# Patient Record
Sex: Female | Born: 1935 | Race: White | Hispanic: No | Marital: Married | Smoking: Current every day smoker
Health system: Southern US, Community
[De-identification: ages and names within clinical notes are randomized; demographics above are authoritative.]

## PROBLEM LIST (undated history)

## (undated) DIAGNOSIS — I739 Peripheral vascular disease, unspecified: Secondary | ICD-10-CM

## (undated) DIAGNOSIS — E119 Type 2 diabetes mellitus without complications: Secondary | ICD-10-CM

## (undated) DIAGNOSIS — I10 Essential (primary) hypertension: Secondary | ICD-10-CM

## (undated) HISTORY — DX: Type 2 diabetes mellitus without complications: E11.9

## (undated) HISTORY — PX: ABDOMINAL HYSTERECTOMY: SHX81

## (undated) HISTORY — DX: Essential (primary) hypertension: I10

---

## 1963-06-30 HISTORY — PX: CHOLECYSTECTOMY: SHX55

## 2005-12-28 ENCOUNTER — Ambulatory Visit: Payer: Self-pay | Admitting: Unknown Physician Specialty

## 2006-03-25 ENCOUNTER — Ambulatory Visit: Payer: Self-pay | Admitting: Unknown Physician Specialty

## 2006-10-21 ENCOUNTER — Ambulatory Visit: Payer: Self-pay | Admitting: Internal Medicine

## 2007-12-21 ENCOUNTER — Ambulatory Visit: Payer: Self-pay | Admitting: Internal Medicine

## 2008-09-24 ENCOUNTER — Ambulatory Visit: Payer: Self-pay | Admitting: General Practice

## 2008-10-08 ENCOUNTER — Inpatient Hospital Stay: Payer: Self-pay | Admitting: General Practice

## 2009-03-07 ENCOUNTER — Ambulatory Visit: Payer: Self-pay | Admitting: Internal Medicine

## 2010-04-26 ENCOUNTER — Emergency Department: Payer: Self-pay | Admitting: Unknown Physician Specialty

## 2010-07-08 ENCOUNTER — Ambulatory Visit: Payer: Self-pay | Admitting: Internal Medicine

## 2012-07-12 ENCOUNTER — Ambulatory Visit: Payer: Self-pay | Admitting: Internal Medicine

## 2012-11-25 ENCOUNTER — Other Ambulatory Visit: Payer: Self-pay | Admitting: Gastroenterology

## 2012-11-25 LAB — CLOSTRIDIUM DIFFICILE BY PCR

## 2012-11-27 LAB — STOOL CULTURE

## 2012-12-20 ENCOUNTER — Inpatient Hospital Stay: Payer: Self-pay | Admitting: Student

## 2012-12-20 LAB — CBC WITH DIFFERENTIAL/PLATELET
Basophil %: 1.2 %
Eosinophil %: 0.5 %
HCT: 38.7 % (ref 35.0–47.0)
HGB: 13.3 g/dL (ref 12.0–16.0)
Lymphocyte #: 3.6 10*3/uL (ref 1.0–3.6)
Lymphocyte %: 18.4 %
MCV: 89 fL (ref 80–100)
Monocyte #: 2.4 x10 3/mm — ABNORMAL HIGH (ref 0.2–0.9)
Monocyte %: 12.1 %
Neutrophil #: 13.3 10*3/uL — ABNORMAL HIGH (ref 1.4–6.5)
Neutrophil %: 67.8 %
Platelet: 281 10*3/uL (ref 150–440)
RBC: 4.37 10*6/uL (ref 3.80–5.20)
RDW: 13.5 % (ref 11.5–14.5)

## 2012-12-20 LAB — TROPONIN I: Troponin-I: <0.02 ng/mL

## 2012-12-20 LAB — COMPREHENSIVE METABOLIC PANEL
Albumin: 3.5 g/dL (ref 3.4–5.0)
Alkaline Phosphatase: 95 U/L (ref 50–136)
Anion Gap: 8 (ref 7–16)
BUN: 13 mg/dL (ref 7–18)
Bilirubin,Total: 0.7 mg/dL (ref 0.2–1.0)
Calcium, Total: 9.9 mg/dL (ref 8.5–10.1)
Chloride: 96 mmol/L — ABNORMAL LOW (ref 98–107)
Co2: 28 mmol/L (ref 21–32)
EGFR (Non-African Amer.): 60 — ABNORMAL LOW
Glucose: 143 mg/dL — ABNORMAL HIGH (ref 65–99)
SGOT(AST): 33 U/L (ref 15–37)
SGPT (ALT): 26 U/L (ref 12–78)
Sodium: 132 mmol/L — ABNORMAL LOW (ref 136–145)

## 2012-12-20 LAB — URINALYSIS, COMPLETE
Ketone: NEGATIVE
Leukocyte Esterase: NEGATIVE
Nitrite: NEGATIVE
RBC,UR: 5 /HPF (ref 0–5)
Squamous Epithelial: NONE SEEN

## 2012-12-21 LAB — CBC WITH DIFFERENTIAL/PLATELET
Basophil #: 0.1 10*3/uL (ref 0.0–0.1)
Basophil %: 0.8 %
Eosinophil #: 0.1 10*3/uL (ref 0.0–0.7)
HCT: 37.4 % (ref 35.0–47.0)
HGB: 13 g/dL (ref 12.0–16.0)
Lymphocyte #: 2.6 10*3/uL (ref 1.0–3.6)
Lymphocyte %: 17.5 %
MCV: 89 fL (ref 80–100)
Monocyte %: 11.1 %
Neutrophil #: 10.4 10*3/uL — ABNORMAL HIGH (ref 1.4–6.5)
Neutrophil %: 69.8 %

## 2012-12-21 LAB — MAGNESIUM: Magnesium: 1.9 mg/dL

## 2012-12-21 LAB — HEMOGLOBIN A1C: Hemoglobin A1C: 8.1 % — ABNORMAL HIGH (ref 4.2–6.3)

## 2012-12-22 LAB — CBC WITH DIFFERENTIAL/PLATELET
Basophil %: 0.5 %
Eosinophil #: 0.3 10*3/uL (ref 0.0–0.7)
Eosinophil %: 2.2 %
HGB: 12.5 g/dL (ref 12.0–16.0)
MCH: 30.1 pg (ref 26.0–34.0)
MCHC: 33.9 g/dL (ref 32.0–36.0)
MCV: 89 fL (ref 80–100)
Monocyte #: 1.4 x10 3/mm — ABNORMAL HIGH (ref 0.2–0.9)
Monocyte %: 10.1 %
Neutrophil #: 9.5 10*3/uL — ABNORMAL HIGH (ref 1.4–6.5)
Platelet: 254 10*3/uL (ref 150–440)
RBC: 4.15 10*6/uL (ref 3.80–5.20)
WBC: 14 10*3/uL — ABNORMAL HIGH (ref 3.6–11.0)

## 2012-12-22 LAB — BASIC METABOLIC PANEL
Anion Gap: 6 — ABNORMAL LOW (ref 7–16)
BUN: 9 mg/dL (ref 7–18)
Calcium, Total: 9.3 mg/dL (ref 8.5–10.1)
Chloride: 101 mmol/L (ref 98–107)
EGFR (African American): >60
Glucose: 138 mg/dL — ABNORMAL HIGH (ref 65–99)

## 2012-12-22 LAB — URINE CULTURE

## 2012-12-23 LAB — CBC WITH DIFFERENTIAL/PLATELET
Basophil #: 0.1 10*3/uL (ref 0.0–0.1)
Basophil %: 0.4 %
HCT: 32.9 % — ABNORMAL LOW (ref 35.0–47.0)
HGB: 11.3 g/dL — ABNORMAL LOW (ref 12.0–16.0)
Monocyte #: 1.9 x10 3/mm — ABNORMAL HIGH (ref 0.2–0.9)
Neutrophil #: 9.4 10*3/uL — ABNORMAL HIGH (ref 1.4–6.5)
RBC: 3.7 10*6/uL — ABNORMAL LOW (ref 3.80–5.20)
RDW: 13.7 % (ref 11.5–14.5)

## 2012-12-26 LAB — CULTURE, BLOOD (SINGLE)

## 2013-01-30 ENCOUNTER — Ambulatory Visit: Payer: Self-pay | Admitting: Gastroenterology

## 2013-04-28 ENCOUNTER — Inpatient Hospital Stay: Payer: Self-pay | Admitting: Internal Medicine

## 2013-04-28 LAB — DIFFERENTIAL
Basophil #: 0.1 10*3/uL (ref 0.0–0.1)
Comment - H1-Com1: NORMAL
Eosinophil %: 0 %
Lymphocyte #: 1.8 10*3/uL (ref 1.0–3.6)
Lymphocyte %: 6 %
Lymphocytes: 9 %
Monocyte %: 7.3 %
Monocytes: 8 %
Neutrophil %: 86.4 %
Segmented Neutrophils: 73 %

## 2013-04-28 LAB — URINALYSIS, COMPLETE
Bilirubin,UR: NEGATIVE
Glucose,UR: NEGATIVE mg/dL (ref 0–75)
Hyaline Cast: 2
Ketone: NEGATIVE
Nitrite: NEGATIVE
Protein: 30
RBC,UR: 1 /HPF (ref 0–5)
Squamous Epithelial: <1

## 2013-04-28 LAB — CBC
HCT: 38.5 % (ref 35.0–47.0)
HGB: 12.8 g/dL (ref 12.0–16.0)
MCH: 29 pg (ref 26.0–34.0)
MCHC: 33.3 g/dL (ref 32.0–36.0)
Platelet: 294 10*3/uL (ref 150–440)

## 2013-04-28 LAB — RAPID INFLUENZA A&B ANTIGENS

## 2013-04-28 LAB — COMPREHENSIVE METABOLIC PANEL
Albumin: 3.3 g/dL — ABNORMAL LOW (ref 3.4–5.0)
Alkaline Phosphatase: 98 U/L (ref 50–136)
Anion Gap: 9 (ref 7–16)
Bilirubin,Total: 0.6 mg/dL (ref 0.2–1.0)
Calcium, Total: 9.5 mg/dL (ref 8.5–10.1)
Chloride: 105 mmol/L (ref 98–107)
Co2: 21 mmol/L (ref 21–32)
EGFR (African American): >60
EGFR (Non-African Amer.): 53 — ABNORMAL LOW
Glucose: 183 mg/dL — ABNORMAL HIGH (ref 65–99)
Osmolality: 278 (ref 275–301)
Potassium: 3.7 mmol/L (ref 3.5–5.1)
SGPT (ALT): 25 U/L (ref 12–78)
Sodium: 135 mmol/L — ABNORMAL LOW (ref 136–145)
Total Protein: 7 g/dL (ref 6.4–8.2)

## 2013-04-28 LAB — SEDIMENTATION RATE: Erythrocyte Sed Rate: 10 mm/hr (ref 0–30)

## 2013-04-29 LAB — CBC WITH DIFFERENTIAL/PLATELET
Eosinophil #: 0 10*3/uL (ref 0.0–0.7)
Lymphocyte #: 1.3 10*3/uL (ref 1.0–3.6)
MCH: 29.5 pg (ref 26.0–34.0)
MCHC: 33.7 g/dL (ref 32.0–36.0)

## 2013-04-29 LAB — COMPREHENSIVE METABOLIC PANEL
Albumin: 2.8 g/dL — ABNORMAL LOW (ref 3.4–5.0)
Alkaline Phosphatase: 80 U/L (ref 50–136)
Anion Gap: 3 — ABNORMAL LOW (ref 7–16)
BUN: 25 mg/dL — ABNORMAL HIGH (ref 7–18)
Calcium, Total: 8.9 mg/dL (ref 8.5–10.1)
Glucose: 170 mg/dL — ABNORMAL HIGH (ref 65–99)
Osmolality: 267 (ref 275–301)
SGOT(AST): 25 U/L (ref 15–37)
SGPT (ALT): 21 U/L (ref 12–78)
Sodium: 129 mmol/L — ABNORMAL LOW (ref 136–145)
Total Protein: 6.7 g/dL (ref 6.4–8.2)

## 2013-04-29 LAB — URINE CULTURE

## 2013-04-29 LAB — HEMOGLOBIN A1C: Hemoglobin A1C: 7.2 % — ABNORMAL HIGH (ref 4.2–6.3)

## 2013-04-30 LAB — CBC WITH DIFFERENTIAL/PLATELET
Basophil #: 0.1 10*3/uL (ref 0.0–0.1)
Basophil %: 0.4 %
Eosinophil #: 0.2 10*3/uL (ref 0.0–0.7)
Eosinophil %: 1.2 %
HCT: 30.7 % — ABNORMAL LOW (ref 35.0–47.0)
HGB: 10.5 g/dL — ABNORMAL LOW (ref 12.0–16.0)
Lymphocyte %: 16.1 %
MCHC: 34.3 g/dL (ref 32.0–36.0)
MCV: 86 fL (ref 80–100)
Monocyte #: 1.1 x10 3/mm — ABNORMAL HIGH (ref 0.2–0.9)
Neutrophil #: 10.5 10*3/uL — ABNORMAL HIGH (ref 1.4–6.5)
Neutrophil %: 74.6 %
Platelet: 242 10*3/uL (ref 150–440)
RDW: 13.9 % (ref 11.5–14.5)
WBC: 14 10*3/uL — ABNORMAL HIGH (ref 3.6–11.0)

## 2013-04-30 LAB — BASIC METABOLIC PANEL
Anion Gap: 5 — ABNORMAL LOW (ref 7–16)
BUN: 15 mg/dL (ref 7–18)
Chloride: 106 mmol/L (ref 98–107)
Creatinine: 0.99 mg/dL (ref 0.60–1.30)
EGFR (African American): >60
EGFR (Non-African Amer.): 53 — ABNORMAL LOW
Osmolality: 275 (ref 275–301)
Sodium: 136 mmol/L (ref 136–145)

## 2013-05-03 ENCOUNTER — Encounter: Payer: Self-pay | Admitting: Internal Medicine

## 2013-05-03 LAB — CULTURE, BLOOD (SINGLE)

## 2013-07-25 ENCOUNTER — Ambulatory Visit: Payer: Self-pay | Admitting: General Practice

## 2013-07-25 LAB — CBC
HCT: 39.1 % (ref 35.0–47.0)
HGB: 12.8 g/dL (ref 12.0–16.0)
MCH: 28.6 pg (ref 26.0–34.0)
MCHC: 32.6 g/dL (ref 32.0–36.0)
MCV: 88 fL (ref 80–100)
Platelet: 340 10*3/uL (ref 150–440)
RBC: 4.47 10*6/uL (ref 3.80–5.20)
RDW: 13.6 % (ref 11.5–14.5)
WBC: 10.5 10*3/uL (ref 3.6–11.0)

## 2013-07-25 LAB — URINALYSIS, COMPLETE
Bilirubin,UR: NEGATIVE
Blood: NEGATIVE
GLUCOSE, UR: NEGATIVE mg/dL (ref 0–75)
Hyaline Cast: 4
Ketone: NEGATIVE
Nitrite: NEGATIVE
Ph: 5 (ref 4.5–8.0)
Protein: 30
RBC,UR: 10 /HPF (ref 0–5)
Specific Gravity: 1.014 (ref 1.003–1.030)
Transitional Epi: 3

## 2013-07-25 LAB — BASIC METABOLIC PANEL
Anion Gap: 8 (ref 7–16)
BUN: 16 mg/dL (ref 7–18)
CALCIUM: 9.9 mg/dL (ref 8.5–10.1)
CHLORIDE: 98 mmol/L (ref 98–107)
CREATININE: 0.88 mg/dL (ref 0.60–1.30)
Co2: 27 mmol/L (ref 21–32)
EGFR (Non-African Amer.): >60
Glucose: 158 mg/dL — ABNORMAL HIGH (ref 65–99)
OSMOLALITY: 271 (ref 275–301)
Potassium: 3.9 mmol/L (ref 3.5–5.1)
Sodium: 133 mmol/L — ABNORMAL LOW (ref 136–145)

## 2013-07-25 LAB — PROTIME-INR
INR: 1
Prothrombin Time: 12.8 secs (ref 11.5–14.7)

## 2013-07-25 LAB — APTT: ACTIVATED PTT: 34.3 s (ref 23.6–35.9)

## 2013-07-25 LAB — SEDIMENTATION RATE: Erythrocyte Sed Rate: 18 mm/hr (ref 0–30)

## 2013-07-26 LAB — MRSA PCR SCREENING

## 2013-07-27 LAB — URINE CULTURE

## 2013-08-07 ENCOUNTER — Inpatient Hospital Stay: Payer: Self-pay | Admitting: General Practice

## 2013-08-08 LAB — BASIC METABOLIC PANEL
ANION GAP: 6 — AB (ref 7–16)
BUN: 8 mg/dL (ref 7–18)
CO2: 25 mmol/L (ref 21–32)
Calcium, Total: 8.9 mg/dL (ref 8.5–10.1)
Chloride: 103 mmol/L (ref 98–107)
Creatinine: 0.85 mg/dL (ref 0.60–1.30)
GLUCOSE: 201 mg/dL — AB (ref 65–99)
Osmolality: 272 (ref 275–301)
POTASSIUM: 3.8 mmol/L (ref 3.5–5.1)
Sodium: 134 mmol/L — ABNORMAL LOW (ref 136–145)

## 2013-08-08 LAB — HEMOGLOBIN: HGB: 10.3 g/dL — AB (ref 12.0–16.0)

## 2013-08-08 LAB — PLATELET COUNT: PLATELETS: 259 10*3/uL (ref 150–440)

## 2013-08-09 LAB — BASIC METABOLIC PANEL
ANION GAP: 6 — AB (ref 7–16)
BUN: 8 mg/dL (ref 7–18)
CALCIUM: 8.9 mg/dL (ref 8.5–10.1)
CHLORIDE: 101 mmol/L (ref 98–107)
Co2: 26 mmol/L (ref 21–32)
Creatinine: 0.72 mg/dL (ref 0.60–1.30)
EGFR (African American): >60
EGFR (Non-African Amer.): >60
GLUCOSE: 119 mg/dL — AB (ref 65–99)
Osmolality: 266 (ref 275–301)
POTASSIUM: 3.7 mmol/L (ref 3.5–5.1)
Sodium: 133 mmol/L — ABNORMAL LOW (ref 136–145)

## 2013-08-09 LAB — HEMOGLOBIN: HGB: 9.6 g/dL — ABNORMAL LOW (ref 12.0–16.0)

## 2013-08-09 LAB — PLATELET COUNT: Platelet: 243 10*3/uL (ref 150–440)

## 2013-08-10 LAB — PATHOLOGY REPORT

## 2013-08-12 ENCOUNTER — Encounter: Payer: Self-pay | Admitting: Internal Medicine

## 2013-08-20 ENCOUNTER — Emergency Department: Payer: Self-pay | Admitting: Emergency Medicine

## 2013-08-24 LAB — CBC WITH DIFFERENTIAL/PLATELET
Basophil #: 0.2 10*3/uL — ABNORMAL HIGH (ref 0.0–0.1)
Basophil %: 1.4 %
EOS PCT: 1.5 %
Eosinophil #: 0.2 10*3/uL (ref 0.0–0.7)
HCT: 33.7 % — ABNORMAL LOW (ref 35.0–47.0)
HGB: 11 g/dL — ABNORMAL LOW (ref 12.0–16.0)
LYMPHS ABS: 3.4 10*3/uL (ref 1.0–3.6)
LYMPHS PCT: 30.3 %
MCH: 28.2 pg (ref 26.0–34.0)
MCHC: 32.5 g/dL (ref 32.0–36.0)
MCV: 87 fL (ref 80–100)
MONO ABS: 0.8 x10 3/mm (ref 0.2–0.9)
Monocyte %: 7.6 %
Neutrophil #: 6.6 10*3/uL — ABNORMAL HIGH (ref 1.4–6.5)
Neutrophil %: 59.2 %
Platelet: 686 10*3/uL — ABNORMAL HIGH (ref 150–440)
RBC: 3.89 10*6/uL (ref 3.80–5.20)
RDW: 13.7 % (ref 11.5–14.5)
WBC: 11.2 10*3/uL — ABNORMAL HIGH (ref 3.6–11.0)

## 2013-08-24 LAB — BASIC METABOLIC PANEL
Anion Gap: 6 — ABNORMAL LOW (ref 7–16)
BUN: 16 mg/dL (ref 7–18)
CO2: 29 mmol/L (ref 21–32)
CREATININE: 0.91 mg/dL (ref 0.60–1.30)
Calcium, Total: 10 mg/dL (ref 8.5–10.1)
Chloride: 98 mmol/L (ref 98–107)
EGFR (African American): >60
EGFR (Non-African Amer.): 59 — ABNORMAL LOW
GLUCOSE: 125 mg/dL — AB (ref 65–99)
Osmolality: 269 (ref 275–301)
POTASSIUM: 3.8 mmol/L (ref 3.5–5.1)
Sodium: 133 mmol/L — ABNORMAL LOW (ref 136–145)

## 2013-08-27 ENCOUNTER — Encounter: Payer: Self-pay | Admitting: Internal Medicine

## 2013-08-31 LAB — HEMOGLOBIN A1C: Hemoglobin A1C: 7.1 % — ABNORMAL HIGH (ref 4.2–6.3)

## 2013-12-14 DIAGNOSIS — E1169 Type 2 diabetes mellitus with other specified complication: Secondary | ICD-10-CM | POA: Insufficient documentation

## 2013-12-14 DIAGNOSIS — N183 Chronic kidney disease, stage 3 unspecified: Secondary | ICD-10-CM | POA: Insufficient documentation

## 2013-12-14 DIAGNOSIS — K219 Gastro-esophageal reflux disease without esophagitis: Secondary | ICD-10-CM | POA: Insufficient documentation

## 2013-12-14 DIAGNOSIS — E785 Hyperlipidemia, unspecified: Secondary | ICD-10-CM

## 2013-12-14 DIAGNOSIS — I10 Essential (primary) hypertension: Secondary | ICD-10-CM

## 2013-12-14 DIAGNOSIS — Z794 Long term (current) use of insulin: Secondary | ICD-10-CM

## 2013-12-14 DIAGNOSIS — E1122 Type 2 diabetes mellitus with diabetic chronic kidney disease: Secondary | ICD-10-CM | POA: Insufficient documentation

## 2013-12-14 DIAGNOSIS — I350 Nonrheumatic aortic (valve) stenosis: Secondary | ICD-10-CM | POA: Insufficient documentation

## 2013-12-14 DIAGNOSIS — E1159 Type 2 diabetes mellitus with other circulatory complications: Secondary | ICD-10-CM | POA: Insufficient documentation

## 2014-01-17 DIAGNOSIS — R609 Edema, unspecified: Secondary | ICD-10-CM | POA: Insufficient documentation

## 2014-06-18 DIAGNOSIS — Z79899 Other long term (current) drug therapy: Secondary | ICD-10-CM | POA: Insufficient documentation

## 2014-06-29 HISTORY — PX: JOINT REPLACEMENT: SHX530

## 2014-08-09 IMAGING — CR RIGHT HIP - COMPLETE 2+ VIEW
1 series · 4 of 4 positions shown · non-contrast
Comparison: none

Addendum Begins
REASON FOR EXAM: severe right hip pain
COMMENTS:

PROCEDURE:     DXR - DXR HIP RIGHT COMPLETE  - April 28, 2013 [DATE]
RESULT:
A total right hip replacement is identified. The hardware appears intact
without evidence of loosening or failure. The osseous structures demonstrate
no evidence of fracture or dislocation.

[Series 1: ap · 0.17mm/px · 4 of 4 slices shown]
[im 1/4]
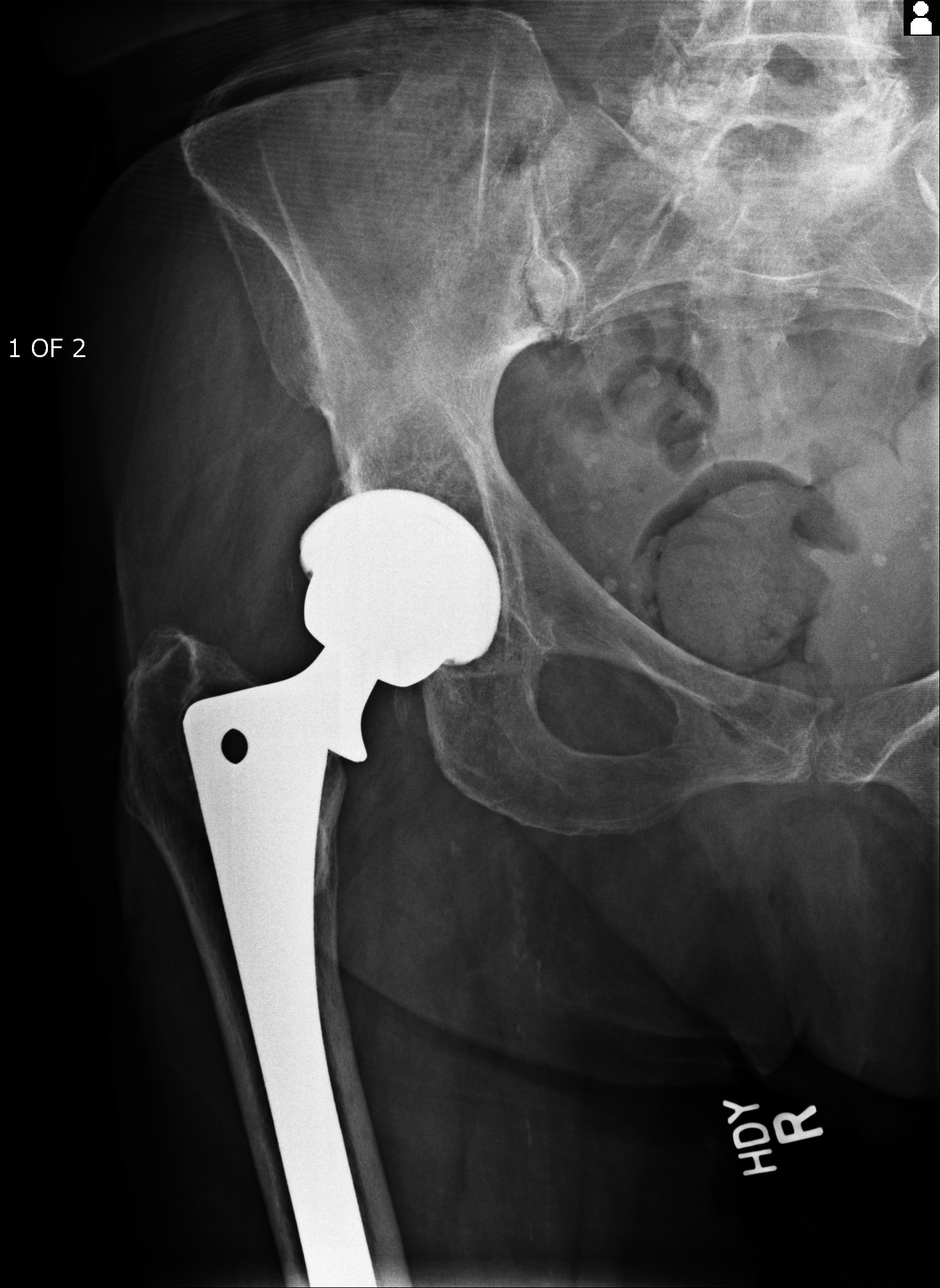
[im 2/4]
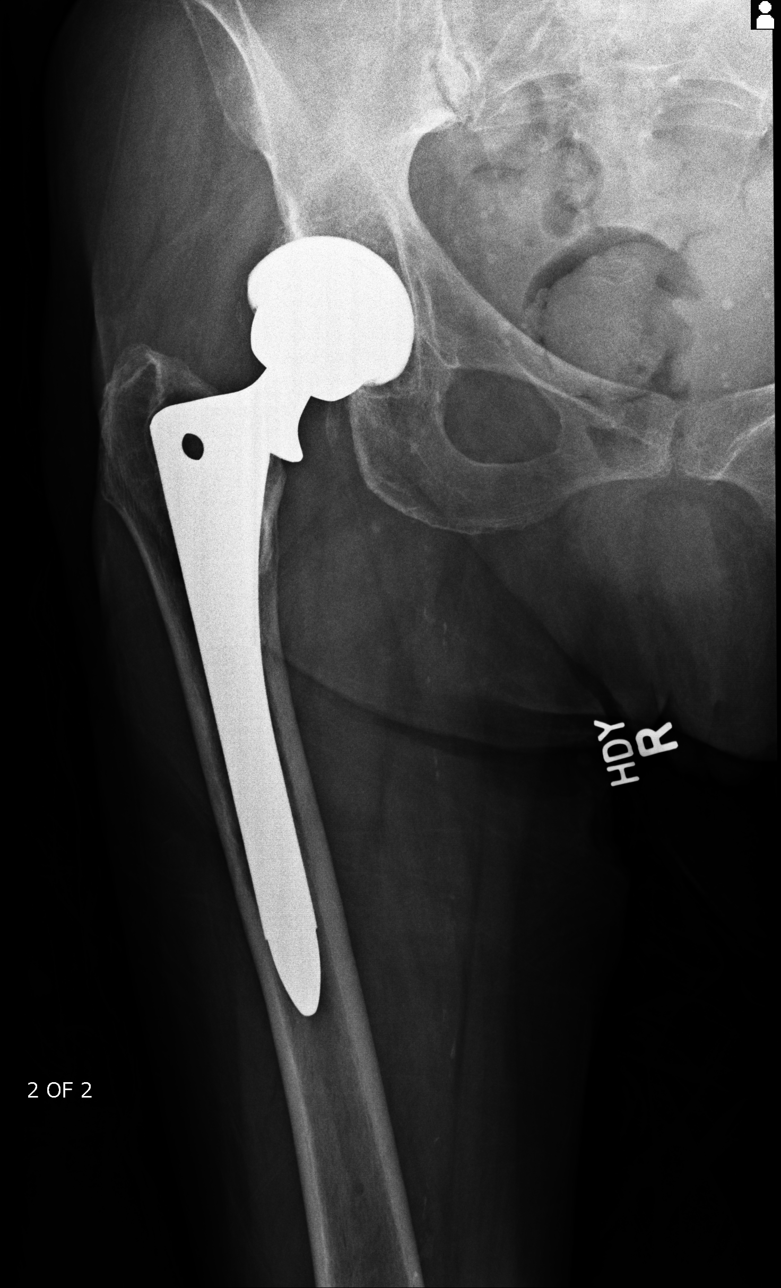
[im 3/4]
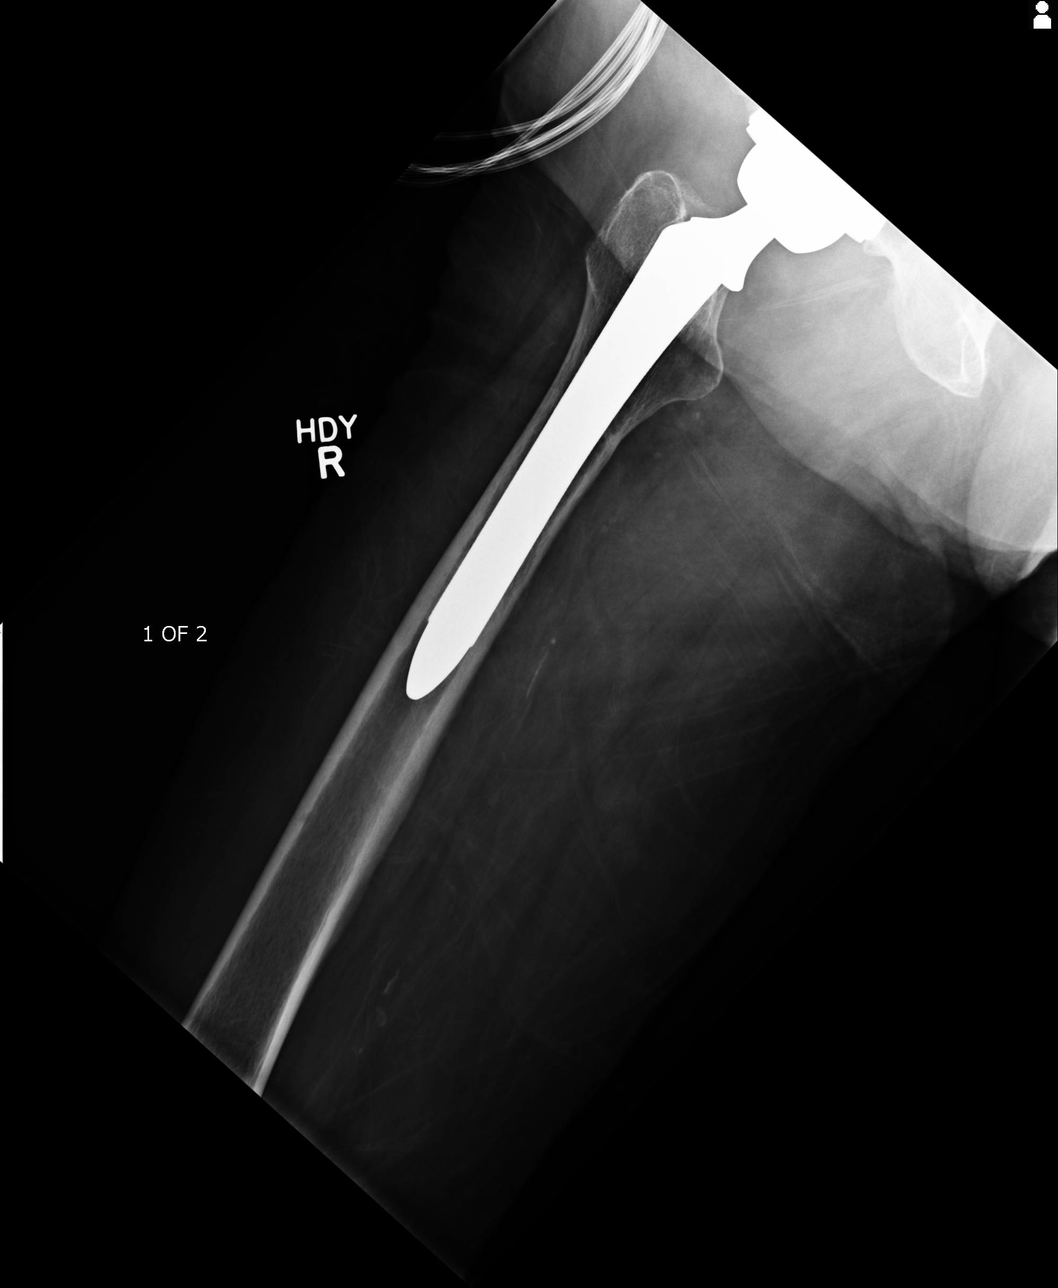
[im 4/4]
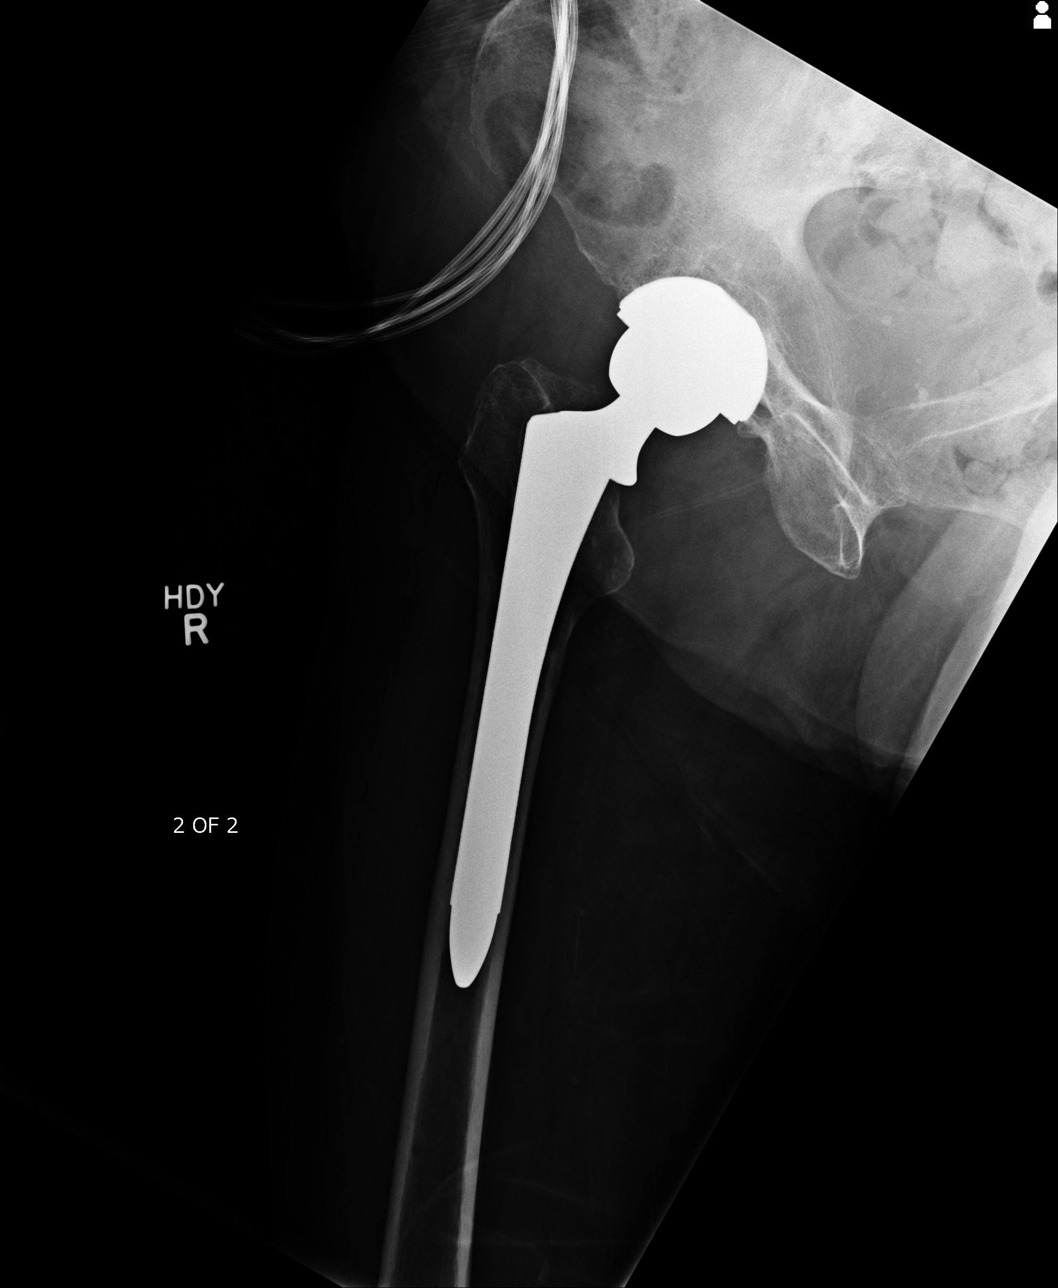

[4 of 4 positions shown; findings below may reference images not displayed]

IMPRESSION: Total right hip replacement without evidence of acute osseous
abnormalities.

Addendum Ends

## 2014-09-27 ENCOUNTER — Ambulatory Visit
Admit: 2014-09-27 | Disposition: A | Discharge status: Home / Self Care | Payer: Self-pay | Attending: Ophthalmology | Admitting: Ophthalmology

## 2014-09-27 LAB — POTASSIUM: Potassium: 4 mmol/L

## 2014-10-04 ENCOUNTER — Ambulatory Visit
Admit: 2014-10-04 | Disposition: A | Discharge status: Home / Self Care | Payer: Self-pay | Attending: Ophthalmology | Admitting: Ophthalmology

## 2014-10-19 NOTE — Consult Note (Signed)
Chief Complaint and History:  Referring Physician Dr. Jacques NavyAhmadzia   Chief Complaint Hyperthyroidism   Allergies:  No Known Allergies:   Assessment/Plan:  Assessment/Plan Pt seen in consultation. 81 yo F with no h/o thyroid disease admitted for sepsis and pneumonia. Found to have incidental mildly low TSH and elevated free T4. Not exposued to iodinated contrast, amiodarone, or lithium. She was started her on glucocorticoids. She does not have a goiter. She is not tachycardic and not on a beta blocker.  A/ Abnormal thyroid function tests c/w hyperthyroidism - causes may include silent thyroiditis in the setting of acute illness versus Grave's Hyperthyroidism versus a toxic adenoma or multinodular goiter. Given her underlying illness and lack of signs/symptoms c/w hyperthyroidism as well as mild nature of abnormal TFTs, I favor silent thyroiditis.  P/ -Will obtain a total T3 and TrAb which should likely be elevated if this is Grave's -Will reepat free T4 in 2-3 days. -If acute thyroiditis, this may self resolved as clinical condition improves in 4-6 weeks. I recomend not treating with thionamide at this time. Also no beta blocker needed as she is not tachycardic.   Will dictate full consult and see pt back as more labs are resulted and/or if clinical condition changes.   Case Discussed With patient   Electronic Signatures: Raj JanusSolum, Mckenzee Beem M (MD)  (Signed 26-Jun-14 17:13)  Authored: Chief Complaint and History, ALLERGIES, Assessment/Plan   Last Updated: 26-Jun-14 17:13 by Raj JanusSolum, Emerick Weatherly M (MD)

## 2014-10-19 NOTE — H&P (Signed)
PATIENT NAME:  Hannah Conway, Musette J MR#:  [id number] DATE OF BIRTH:  1930/04/25  ADDENDUM  Smoking cessation counseling given to the patient for over 5 minutes. The patient not quite ready to quit smoking.   Offer Nicotine patches. The patient does not want it right now.    ____________________________ Felipa Furnaceoberto Sanchez Gutierrez, MD rsg:np D: 04/28/2013 17:10:00 ET T: 04/28/2013 18:04:53 ET JOB#: [id number]  cc: Felipa Furnaceoberto Sanchez Gutierrez, MD, <Dictator> Kyel Purk Juanda ChanceSANCHEZ GUTIERRE MD ELECTRONICALLY SIGNED 05/17/2013 22:57

## 2014-10-19 NOTE — Discharge Summary (Signed)
PATIENT NAME:  Hannah Conway, Hannah Conway MR#:  [id number] DATE OF BIRTH:  12/01/1929  DATE OF ADMISSION:  04/28/2013 DATE OF DISCHARGE:  05/02/2013  DISCHARGE DIAGNOSES:  1.  Systemic inflammatory response syndrome due to pneumonia. 2.  Altered mental status on admission due to pneumonia. 3.  Metabolic encephalopathy.  4.  Hyponatremia.  5.  Hypertension.  6.  Dehydration.  7.  Diabetes.  8.  Osteoarthritis.   CONDITION ON DISCHARGE: Stable.   CODE STATUS: FULL CODE.   MEDICATIONS ON DISCHARGE: 1.  Aspirin 81 mg oral tablet once a day.  2.  Pravastatin 20 mg oral tablet once a day.  3.  Glipizide XL 10 mg oral tablet once a day.  4.  Benazepril/hydrochlorothiazide 20 mg/12.5 mg oral tablet once a day.  5.  Acetaminophen 325 mg 2 tablets every 4 hours as needed for pain and temperature.  6.  Tamsulosin 0.4 mg oral capsule once a day.  7.  Metformin extended release 500 mg 2 times a day.  8.  Metoprolol 25 mg 2 times a day.  9.  Pantoprazole 40 mg delayed-release tablet once a day.  10.  Azithromycin 500 mg oral tablet once a day for 3 days.   DIET ON DISCHARGE: Low sodium, carbohydrate-controlled ADA diet.  Diet consistency regular.   ACTIVITY: As tolerated.   TIMEFRAME TO FOLLOW-UP: Within 1 to 2 weeks. Advised to have regular followup with PMD and need to check blood pressure medication to be adjusted later on.  HISTORY OF PRESENT ILLNESS: As per Dr. Mordecai MaesSanchez on 31st October 2014, this is an  81 year old female with history of hypertension, diabetes, TIA, hyperlipidemia, COPD and chronic diarrhea who came with history of not being her normal self since 1 day before presentation. She had pain all over her body and she could not walk in the house so her husband called her daughter and they decided to bring her to hospital. She was a smoker and had recent admission with COPD in the past. Her white cell count was found 29,000 on admission and she had a fever of 103 degrees Fahrenheit with  tachycardia of up to 100. Chest x-ray did not show any significant consolidation, urinalysis was negative, so she was admitted with SIRS of unknown etiology and started on broad-spectrum antibiotic. During the hospital course, on CT of the chest and abdomen, she was found having some consolidation in lower lobe of lungs and there were some infiltrates so antibiotic, broad-spectrum, continued and the patient had significant improvement in overall mental status and her white cell count steadily came down in next 2 days. The patient was back to her baseline mental status, was feeling slightly weak, which was generalized weakness, so we tapered down her antibiotics after 48 hours of broad-spectrum antibiotic, and she remained stable for the next 2 days. Physical therapy evaluation was done and they suggested for her to go to a rehab place due to her weakness and we arranged for that on discharge.   Other medical issues in the hospital: 1.  Lactic acidosis which was present on admission, most likely due to infection which improved later on.  2.  Dehydration and hyponatremia. They were present on admission, but with IV fluids they improved.  3.  Diabetes. The patient was continued on glipizide and insulin sliding scale coverage. Remained stable.  4.  Hypertension. Initially blood pressure was running low normal so she was off blood pressure medication, but then gradually with infection coming under control her blood  pressure continued improving and we added her home medication, benazepril/hydrochlorothiazide. Still blood pressure was running high and we had to add metoprolol for better control.   IMPORTANT LABORATORY AND DIAGNOSTICS: In the hospital, CT of the head without contrast showed changes of atrophy and chronic white microvascular ischemic disease. No acute abnormality. White cell count was 29,500, hemoglobin was 12.8 and platelet count 294 on admission. BUN was 23, creatinine was 1 and sodium was 135 on  admission. Chest portable single view on admission: No evidence of acute cardiopulmonary disease. Blood culture was negative. Urinalysis was negative on admission. Influenza A and B were  negative. Lactic acid was 1.6 on admission. CT of chest, abdomen and pelvis: Right lower lobe pneumonia, moderate bladder distention. Foley catheter may prove useful. Finding most consistent with multinodular goiter as described above. White cell count came down to 25 the next day and sodium level dropped to 129 the next day with IV fluid supplementation. Now her sodium improved to 136 and her white cell count came down to 14,000.   TOTAL TIME SPENT ON THIS DISCHARGE: 40 minutes. ____________________________ Hope Pigeon Elisabeth Pigeon, MD vgv:sb D: 05/02/2013 11:05:26 ET T: 05/02/2013 11:50:21 ET JOB#: [id number]  cc: Hope Pigeon. Elisabeth Pigeon, MD, <Dictator> Altamese Dilling MD ELECTRONICALLY SIGNED 05/15/2013 8:12

## 2014-10-19 NOTE — H&P (Signed)
PATIENT NAME:  Hannah Conway, Hannah Conway MR#:  [id number] DATE OF BIRTH:  1929-12-27  DATE OF ADMISSION:  12/20/2012  PRIMARY CARE PHYSICIAN:  Dr. Mickey Farberavid Thies   REFERRING PHYSICIAN:  Dr. Darnelle CatalanMalinda  CHIEF COMPLAINT:  Cough, sputum, fever for 2 days.   HISTORY OF PRESENT ILLNESS: An 81 year old Caucasian female with a history of hypertension, diabetes, TIA. Presented to the ED with cough, sputum, fever for the past 2 days. In addition, patient has shortness of breath, weakness, chills, poor oral intake, unable to walk. The patient is alert, awake, oriented. Denies any abdominal pain, nausea, vomiting or diarrhea. No chest pain, palpitations, orthopnea or nocturnal dyspnea.   PAST MEDICAL HISTORY:  Hypertension, diabetes, TIA.  FAMILY HISTORY:  Hypertension, diabetes.   ALLERGIES:  None.   HOME MEDICATIONS:  Tramadol 50 mg p.o. q. 4 to 6 hours p.r.n., pravastatin 20 mg p.o. at bedtime, Onglyza 5 mg p.o. daily, metformin extended release 500 mg p.o. 2 tablets b.i.d., glipizide XL 10 mg p.o. once a day, benazepril/HCTZ 20 mg/12.5 mg p.o. daily, aspirin 81 mg p.o. daily.   REVIEW OF SYSTEMS:   CONSTITUTIONAL: The patient has fever, chills, headaches, dizziness and weakness.  EYES:  No double vision or blurry vision.  ENT:  No postnasal drip, slurred speech or dysphagia. No epistaxis.  CARDIOVASCULAR: No chest pain, palpitation, orthopnea or nocturnal dyspnea. No leg edema.  PULMONARY:  Positive for cough, sputum, shortness of breath. No wheezing or hemoptysis.  GASTROINTESTINAL:  No abdominal pain, but has nausea. No vomiting, diarrhea. No melena or bloody stool.  GENITOURINARY:  No dysuria, hematuria or incontinence.  SKIN:  No rash or jaundice.  NEUROLOGY:  No syncope, loss of consciousness or seizure.  HEMATOLOGIC: No easy bruising or bleeding.  ENDOCRINE: No polyuria, polydipsia, heat or cold intolerance. Blood sugar is about 200 at home.   VITAL SIGNS: Temperature 102, blood pressure 148/76, pulse  85, respirations 20, O2 saturation 91%.   PHYSICAL EXAMINATION:  GENERAL: The patient is alert, awake, oriented, in no acute distress.  HEENT:  Pupils are round, equal, react to light and accommodation. Moist oral mucosa. Clear pharynx.  NECK: Supple. No JVD or carotid bruit. No lymphadenopathy. No proceeding. No thyromegaly.  CARDIOVASCULAR:  S1 and S2 are regular rate and rhythm. Has a 3/6 systolic murmur. No gallop. PULMONARY:  Bilateral air entry. No wheezing, rales or crackles, but has been very weak recently.  ABDOMEN:  Soft. No distention or tenderness. No organomegaly. Bowel sounds present.  EXTREMITIES: No edema, clubbing or cyanosis. No calf tenderness. Strong bilateral pedal pulses.  SKIN:  No rash or jaundice.  NEUROLOGIC: Alert and oriented xc3. No focal deficit. Power 5/5. Sensation intact.   LABORATORY DATA:  Urinalysis is negative. Chest x-ray shows some interstitial markings, possible pneumonia. Glucose 143, BUN 13, creatinine 0.91, sodium 132, potassium 4.1, chloride 96, bicarb 28. Troponin less than 0.02. WBC 19.6, hemoglobin 13.3, platelets 281. Magnesium 1.1.   SOCIAL HISTORY:  The patient has been smoking for many years. No alcohol drinking or illicit drugs.   IMPRESSIONS: 1.  Sepsis.  2.  Pneumonia.  3.  Hypomagnesemia.   4.  Hypertension.  5.  Diabetes.  6.  Tobacco abuse.   PLAN OF TREATMENT: 1.  The patient will be admitted to a medical floor. The patient was treated with Zithromax and Rocephin in the ED. We will start Levaquin and follow up blood culture, CBC, sputum culture.   2.  We will continue hypertension medication, enalapril/hydrochlorothiazide.  3.  For diabetes, we will hold p.o. medications, start sliding scale, check hemoglobin A1c.    4.  For hypomagnesemia, we will give magnesium supplement and follow up level.   5.  For tobacco abuse, Smoking Cessation was consulted, and patient will be given a nicotine patch.  Discussed the patient's  condition and plan of treatment with the patient and the patient's daughter.   TIME SPENT: About 57 minutes.   The patient is a FULL CODE.    ____________________________ Shaune Pollack, MD qc:mr D: 12/20/2012 17:54:12 ET T: 12/20/2012 19:28:13 ET JOB#: [id number]  cc: Shaune Pollack, MD, <Dictator> Shaune Pollack MD ELECTRONICALLY SIGNED 12/22/2012 15:20

## 2014-10-19 NOTE — Discharge Summary (Signed)
PATIENT NAME:  Hannah Conway, Hannah Conway MR#:  [id number] DATE OF BIRTH:  1929-11-19  DATE OF ADMISSION:  12/20/2012 DATE OF DISCHARGE:  12/23/2012  CONSULTANTS: Dr. Tedd SiasSolum from endocrinology.   PCP: Dr. Audelia Actonheis.   CHIEF COMPLAINT: Cough, sputum, fever.   DISCHARGE DIAGNOSES 1.  Severe sepsis.  2.  Acute respiratory failure.  3.  Probable pneumonia.  4.  Possible underlying undiagnosed chronic obstructive pulmonary disease.  5.  Low TSH and high T3, possibly hypothyroidism versus thyroiditis.  6.  Hypertension.  7.  Diabetes.  8.  Hypomagnesemia.  9.  History of transient ischemic attack.   DISCHARGE MEDICATIONS 1.  Azithromycin 500 mg daily for 4 days.  2.  Vantin 200 mg twice daily for 4 days.  3.  Omeprazole 20 mg daily.  4.  Metformin 500 mg extended-release 2 tabs 2 times a day.  5.  Tramadol 50 mg every 4 to 6 hours as needed.  6.  Onglyza 5 mg once a day.  7.  Aspirin 81 mg daily.  8.  Pravastatin 20 mg daily.  9.  Glipizide XL 10 mg once a day.  10.  Benazepril/hydrochlorothiazide 20/12.5 mg 1 tab once a day.    She will be getting discharged with home health, PT and RN.   DIET: Low sodium, ADA diet.   ACTIVITY: As tolerated.   FOLLOWUP: Please follow with PCP and repeat x-ray of the chest in 4 to 6 weeks and get scheduled for pulmonary function tests an outpatient. Please follow with Dr. Tedd SiasSolum for endocrine and thyroid labs which are still pending on January 06, 2013, at 1:15 p.m.   CODE STATUS:  The patient is full code.   SIGNIFICANT LABORATORY AND RADIOLOGICAL DATA: Initial WBC 19.6, hemoglobin 13.3, platelets 281. A TSH on arrival was 0.264 and free thyroxine was elevated at 1.59. Troponin was negative. Blood cultures no growth to date x 2. Urine cultures no growth to date. Last WBC of 14.2 today, hemoglobin 11.3, platelets 266. LFTs on arrival within normal limits. Hemoglobin A1c of 8.1. Magnesium on arrival 1.1, sodium 132, potassium 4.1 on arrival. Initial x-ray of the chest  no acute cardiopulmonary disease, osteopenia. The following day, chest PA and lateral x-ray showed minimum right lung base presumed atelectasis, possible early infiltrate in the right upper lobe.   HISTORY OF PRESENT ILLNESS AND HOSPITAL COURSE: For full details of H and P, please see the dictation on June 24 by Dr. Imogene Burnhen, but briefly this is a pleasant 81 year old female who came in with above chief complaint including shortness of breath, weakness, chills, poor p.o. intake. She was significantly hypoxic on arrival as well as having respiratory failure and leukocytosis with fevers. The patient was admitted to the hospitalist service, started on IV antibiotics for presumed lung issue as the initial x-ray did not show evidence for pneumonia. The patient did spike fevers and with respiratory symptoms hypoxic respiratory failure x-ray of the chest was repeated, PA and lateral, which showed the above findings, likely underlying pneumonia. Currently, she is on ceftriaxone and azithromycin. The fever has resolved, hypoxia has resolved. The patient likely has severe sepsis secondary to the pneumonia which is currently resolved. She was also started on some gentle IV fluids. Tobacco abuse was strongly recommended against and she was started on nicotine patch. Her blood pressure remained stable. As she was a long-time smoker, outpatient pulmonary function tests have been recommended and she will be getting discharged with p.o. antibiotics.   She did have hyponatremia  and hypomagnesemia which were corrected. She did have possible hypothyroid state versus thyroiditis based on the labs and Dr. Tedd Sias from endocrinology was consulted. We still have some labs including thyrotropin receptor antibodies and T3 pending and we have an appointment for the patient. She was seen by PT and the recommendation is home health, and this has been arranged. At this point, with severe sepsis resolved, she will be discharged with outpatient  followup.   ____________________________ Krystal Eaton, MD sa:cs D: 12/23/2012 15:48:00 ET T: 12/23/2012 22:39:33 ET JOB#: [id number]  cc: Dr. Audelia Acton at [facility] A. Wendall Mola, MD Marcelle Smiling Jacques Navy, MD, <Dictator>  Krystal Eaton MD ELECTRONICALLY SIGNED 01/06/2013 14:59

## 2014-10-19 NOTE — Consult Note (Signed)
PATIENT NAME:  Hannah Conway, Hannah Conway MR#:  [id number] DATE OF BIRTH:  [birth year]  DATE OF CONSULTATION:  12/22/2012  REFERRING PHYSICIAN:  Vivien Presto, MD CONSULTING PHYSICIAN:  A. Lavone Orn, MD  PRIMARY CARE PROVIDER:  Christena Flake. Thies, MD  CHIEF COMPLAINT:  Abnormal thyroid function tests.   HISTORY OF PRESENT ILLNESS:  This is an 81 year old female smoker who was admitted on December 20, 2012 with cough and shortness of breath and found to have pneumonia. She was febrile, mildly hypoxic and hemodynamically stable. She was admitted to the ward and initiated on antibiotics as well as oral steroids. On June 25th, she was noted to have a low TSH of 0.264 uIU/mL (reference range 0.34-5.6) and an elevated free T4 of 1.59 ng/dL (reference range 0.76-1.46). She has no known history of thyroid disease. She denies any neck pain. She has not had any exposure to iodinated contrast dye. Prior to admission, she had not been on glucocorticoids, amiodarone, or lithium. Additionally, she denies heart racing or palpitations, heat intolerance, or recent weight loss.   PAST MEDICAL HISTORY:   1.  Hypertension.  2.  Diabetes.  3.  Hyperlipidemia.  4.  GERD.  5.  TIA.  6.  Tobacco dependence.   ALLERGIES:  No known drug allergies.   INPATIENT MEDICATIONS:    1.  Azithromycin 250 mg IV q. 24 hours.  2.  Benazepril/HCT 20/12.5 mg once daily.  3.  Rocephin 1 gram daily.  4.  Nicotine 14 mg patch once daily.  5.  Pantoprazole 40 mg daily.  6.  Pravastatin 20 mg daily.  7.  Aspirin 81 mg daily.  8.  Lovenox 40 mg daily.  9.  NovoLog sliding scale before every meal and at bedtime.  10.  Albuterol inhaler as needed.  11.  Normal saline 0.9% at 75 mL/hour  SOCIAL HISTORY:  The patient is married. She smokes a pack of cigarettes per day.   FAMILY HISTORY:  Her mother had a history of hyperthyroidism and had undergone radioactive iodine, now deceased. Sister also with a history of hyperthyroidism status post  radioactive iodine use and now with post-ablative hypothyroidism treated with levothyroxine.   REVIEW OF SYSTEMS:  HEENT: Denies blurred vision. Denies sore throat.  NECK: Denies neck pain, denies dysphagia.  CARDIAC: Denies chest pain. Denies palpitation.  PULMONARY: Has had shortness of breath, this seems to be improving. Has a cough.  ABDOMEN: Denies abdominal pain. She was having diarrhea for many weeks prior to hospitalization. She apparently had a GI evaluation for this complaint.  EXTREMITIES: Denies leg swelling. Denies focal weakness.  SKIN: Denies skin rash or recent skin changes. Denies pruritus.  GENERAL: Denies fevers. Denies recent weight loss.  NEUROLOGIC: Denies tremor. Denies recent falls.  ENDOCRINE:  Denies heat or cold intolerance.   PHYSICAL EXAMINATION: VITAL SIGNS: Temperature 99.9, pulse 92, respirations 20, blood pressure 169/77, pulse oximetry 91% on room air.  GENERAL: Well-developed, well-nourished white female in no acute distress.  HEENT: Extraocular movements are intact. Oropharynx is clear. No lid lag or proptosis is present.  NECK: Supple. No thyromegaly is present. No neck tenderness is present. No palpable thyroid nodules.  LYMPHATICS: No submandibular or anterior cervical lymphadenopathy.  CARDIAC: Regular rate and rhythm. A systolic murmur is heard. No carotid bruit.  PULMONARY: Had decreased breath sounds throughout both lung fields. No rhonchi.  ABDOMEN: Diffusely soft, nontender.  NEUROLOGIC: No tremor of outstretched hands. EOMI.  EXTREMITIES: No edema is present.  SKIN: No rash  or dermatopathy is present.  PSYCHIATRIC: Alert and oriented x 3.   LABORATORY DATA:  On December 21, 2012, TSH was 0.264 and free T4 was 1.59. On December 22, 2012, glucose is 138, BUN 9, creatinine 0.84, sodium 137, potassium 3.5, chloride 101, EGFR greater than 60. WBC is 14, hematocrit 36.8, platelets 254.   ASSESSMENT:  An 81 year old female hospitalized with pneumonia,  history of tobacco dependence and probable chronic obstructive pulmonary disease exacerbation found to have incidental abnormal thyroid function tests. She does not have a goiter on exam. She does not have clear signs or symptoms related to hyperthyroidism. She does have a strong family history of hyperthyroidism. I reviewed records at [facility] as well and her last TSH was checked there in December 2010 at which time it was normal; however, she did have a mildly low TSH of 0.335 in June 2010. Currently, her low TSH and elevated free T4 are consistent with hyperthyroidism. Given her clinical situation, this is most likely due to silent thyroiditis which is a self-limited condition and occurs in the setting of acute illness. Other possible causes include Graves' disease or a toxic nodule or multinodular goiter.   RECOMMENDATIONS:   1.  Will check a thyroid antibody as well as a total T3 level, as these tend to be elevated in Graves' disease.  2.  Recommend repeating a free T4 in 2 to 3 days. It would be helpful to see the trend and perhaps if it increases, we could consider therapy.  3.  At this time, my preference is to monitor clinically only and should she develop tachycardia, I could consider use of a beta-blocker and/or initiate ethionamide for treatment of hyperthyroidism; however, I do suspect that this will resolve as her clinical condition improves.  4.  I would avoid iodinated contrast dye if possible given the potential risk of underlying Graves' disease.   Thank you for the kind request for consultation. I will follow along as her labs are resulted.    ____________________________ A. Lavone Orn, MD ams:si D: 12/22/2012 20:54:00 ET T: 12/22/2012 21:55:58 ET JOB#: [id number]  cc: A. Lavone Orn, MD, <Dictator> Sherlon Handing MD ELECTRONICALLY SIGNED 12/25/2012 11:51

## 2014-10-19 NOTE — H&P (Signed)
PATIENT NAME:  Hannah Conway, Hannah Conway MR#:  [id number] DATE OF BIRTH:  09-26-1929  CHIEF COMPLAINT: Aching all over.  REFERRING PHYSICIAN: Lenise Arena, MD  PRIMARY CARE PHYSICIAN: Shanon Brow  , MD  HISTORY OF PRESENT ILLNESS: This is a very nice 81 year old female with history of hypertension, diabetes, TIA in the past, hyperlipidemia, possible COPD, and chronic diarrhea, who comes with a history of being her normal self, being fine yesterday but feeling really bad starting at 3:00 a.m. in the morning. Woke up moaning with pain all over her body. She could not walk today for what her husband called her daughter, who is a CNA and decided to bring her to the hospital.   Her husband has been sick with a cold, but the patient has not developed any other symptoms. He has had some fevers. She has chronic cough due to her smoking and COPD, but no sputum.   She was recently admitted here in 12/20/2012 for apparently a COPD exacerbation with sepsis and pneumonia. The patient, when she was examined and evaluated by the ER physician, told him that that her hip was hurting. Apparently, her hip has been hurting since the time that she had her surgery over 2 years ago. She has not been ambulating very well. She keeps pointing at her hip as an area of pain, but whenever it is examined, the patient does not have any significant pain while rotating internally or externally, pushing and trying to grind the hip. She actually does not even have any significant pain at all.   She does hurt everywhere. Dr. Rudene Christians has evaluated the patient and he also was not impressed  with pain in the hip. The ER physician has ordered an MRI of the hip to evaluate for sepsis. The patient presented with systemic inflammatory response syndrome though we still have not found the source of the infection.   The patient has 29,000 white blood cells and a fever of 103 and she is tachycardic of over 100.   Her chest x-ray did not show any significant  consolidations or infection. Her urinalysis was clean. The patient was admitted for evaluation and treatment of this condition.   REVIEW OF SYSTEMS: Very difficult to obtain as the patient is moaning and saying "Oh, I don't know."  CONSTITUTIONAL: Fever. Positive fatigue and weakness. No weight loss or weight gain.  EYES: No double vision or blurred vision.  HEENT: No tinnitus or ear pain. No difficulty swallowing.  RESPIRATORY: Positive cough which is chronic. No wheezing, hemoptysis. No dyspnea. No painful respiration. Possible COPD.  CARDIOVASCULAR: No chest pain, orthopnea, palpitations.  GASTROINTESTINAL: No nausea, vomiting, or abdominal pain; although when examined, she hurts all over her abdomen. She has chronic diarrhea for which she has undergone a colonoscopy on August 4 with normal colon. Biopsy was taken for microscopic colitis.  GENITOURINARY: No difficulty urinating. No changes in frequency.  ENDOCRINE: No polyuria, polydipsia, polyphagia.  HEMOLYMPHATIC: No anemia, easy bruising or bleeding.  SKIN: No rashes, petechiae or new lesions.  MUSCULOSKELETAL: Positive chronic hip pain bilaterally. The right one had surgery. The left one apparently is bone on bone. No gout. Positive pain in all her joints. She states that she hurts everywhere, myalgias and arthralgias.  NEUROLOGIC: No numbness. No weakness. The patient is starting to be a little bit confused. She denies any dementia.  PSYCHIATRIC: No anxiety or insomnia or depression.   PAST MEDICAL HISTORY:  1.  Hypertension.  2.  Diabetes.  3.  TIA.  4.  Hyperlipidemia.  5.  COPD. 6.  Chronic diarrhea.  7.  Ongoing smoking.   FAMILY HISTORY:  1.  Hypertension.  2.  Diabetes.  3.  Her mother had an MI in the 65s.  4.  No history of cancer.   SOCIAL HISTORY: The patient has been smoking for the past 60 years about a pack a day. She quit for several months the last time she was admitted, but she started again a couple of months  ago. No alcohol. She lives with her husband and daughter. She is retired. She does not use any drugs.   ALLERGIES: No known drug allergies.   PAST SURGICAL HISTORY: Hysterectomy and cholecystectomy.   CURRENT MEDICATIONS: Pravastatin 20 mg daily, metformin 1000 mg twice daily, glipizide XL 10 mg once a day, benazepril with hydrochlorothiazide 20/12.5 once a day, 81 mg of aspirin daily.   PHYSICAL EXAMINATION:  VITAL SIGNS: Temperature 103, heart rate around 102. Blood pressure 114/43, O2 saturation from 94 to 96 at room air, respiratory rate between 19 and 20. The patient is in distress due to pain all over her body. No significant respiratory distress.  HEENT: Her pupils are equal, round, and reactive. Extraocular movements intact. Mucosae are very dry. Saliva was very thick. Anicteric sclerae. Pink conjunctivae. No oral lesions. No oropharyngeal exudates.  NECK: Supple. No JVD. No thyromegaly. No adenopathy. No carotid bruits.  CARDIOVASCULAR: Regular rhythm, tachycardic. No murmurs, rubs or gallops are appreciated. No displacement of PMI.  LUNGS: Clear without any wheezing or crepitus. No rhonchi. No signs of exacerbation of COPD. No dullness to percussion.  ABDOMEN: Soft, tender to palpation around epigastric, mesogastric and lower quadrants. Diffuse. No rebound. No guarding is appreciated. The patient is just tender all over.  SKIN: Negative for external lesions.  EXTREMITIES: No edema, cyanosis or clubbing.  MUSCULOSKELETAL: Exam of the right hip failed to show any significant pain, which was tried to be provoked by internal and external rotation and grinding of the hip. She did not have any pain whatsoever while doing that. She did have pain whenever we were palpating the muscles of the thighs, the muscles of the arms, the muscles of her back, and the muscles of her legs. She does have myalgias and arthralgias. She does have pain on her left hip which is a chronic pain as she has bone-on-bone  hip arthritis.  LYMPHATIC: Negative for lymphadenopathies in neck or supraclavicular areas, groin, epitrochlear.  SKIN: No rashes or petechiae. Decreased turgor.  VASCULAR: Pulses +2. Capillary refill around 5 to 6 seconds. The patient is dehydrated.  MOOD: Anxious, agitated, answered most of her questions as "I don't know, I don't know."   LABORATORY DATA: Glucose 183, BUN 23, creatinine 1, sodium 135, potassium 3.7. GFR is around 53. Total protein 70.  Albumin is 3.3. White count is 29,000. ESR is normal at 10. Hemoglobin 12, platelet count 294. Differential: Neutrophils are 86%, lymphocytes 6%, and there is no description of any normal white blood cells. No leukemoid reaction. Urinalysis did not show any signs of urinary infection. Lactic acid is slightly elevated to 1.6.   ASSESSMENT AND PLAN: An 81 year old female with history of hypertension, smoking, transient ischemic attack, diabetes, chronic diarrhea, who comes with systemic inflammatory response syndrome.  1.  Systemic inflammatory response syndrome versus early sepsis. The patient has an elevation in white count 29,000. She has a temperature of 103. Very likely this is a bacterial process, although with her diffuse symptomatology and muscle  aches all over with a sick contact, we have to contemplate the possibility of flu. We are going to do a rapid flu and, if that is negative  still, we can send a serum level, antibodies for the flu as well, influenza A and B. The patient looks dehydrated. It is very symptomatic. We have to look at other sources of infection. The hip does not look to be infected, but we are going to get an MRI of the hip as Dr. Rudene Christians recommended.  2.  Her lungs sound clear. No signs of chronic obstructive pulmonary disease exacerbation. She does have some chronic findings for what we are going to do a CT scan.  3.  Her abdomen is tender all over. No elevation of liver function tests. We are going to get a CT scan to rule out  the possibility of colitis since she has diarrhea that is chronic which could be masking something, blood cancer like leukemia, lymphoma. There is also a possibility with high blood counts although her differential is not consistent with this type of reaction.  4.  Blood cultures have been taken. The patient is started on broad-spectrum antibiotics, Zosyn and vancomycin.  5.  Lactic acidosis, likely secondary to infection. Continue to observe.  6.  Dehydration. Keep on IV fluids 100 mL an hour.  7.  Hyponatremia, mild. Give IV fluids normal saline.  8.  Increased BUN, likely secondary to dehydration. IV fluids to be continued.  9.  Diabetes. The patient had glucose 183. Continue insulin sliding scale. Continue glipizide. Hold metformin as the patient is going to have a CT scan. Start after 48 hours.  10.  Hypertension, stable. Continue to monitor.  11.  Osteoarthritis with hip pain. Get an MRI of the hip.  12.  Deep vein thrombosis prophylaxis with heparin, gastrointestinal prophylaxis with Protonix. The patient is at high risk of getting worse, starting early sepsis, could pass into severe sepsis, for what we are going to observe very closely.   TIME SPENT: About 60 minutes with this patient.   ____________________________ Saguache Sink, MD rsg:np D: 04/28/2013 14:49:17 ET T: 04/28/2013 15:23:47 ET JOB#: [id number]  cc: Cameron Sink, MD, <Dictator> Lorriann Hansmann America Brown MD ELECTRONICALLY SIGNED 05/17/2013 22:57

## 2014-10-20 NOTE — Discharge Summary (Signed)
PATIENT NAME:  Hannah Conway, Elsa M MR#:  [id number] DATE OF BIRTH:  28-Aug-1929  DATE OF ADMISSION:  08/07/2013 DATE OF DISCHARGE:  08/11/2013  ADMITTING DIAGNOSIS: Degenerative arthrosis of the left hip.   DISCHARGE DIAGNOSIS: Degenerative arthrosis of the left hip.   HISTORY: The patient is an 81 year old who has been followed at [facility] for discomfort to the left hip. She had reported several year history of progressive left hip and groin pain. Her pain was noted be aggravated with weight-bearing activities. The patient had noticed progressive loss of her hip range of motion during this time frame. She tended to ambulate with the use of a cane. The patient had also noted some crepitus with range of motion of her hip. The pain had progressed to the point that it was significantly interfering with her activities of daily living. [facility] showed significant narrowing of the cartilage space with subchondral sclerosis as well as subchondral cyst formation noted. After discussion of the risks and benefits of surgical intervention, the patient expressed her understanding of the risks and benefits and agreed for plans for surgical intervention.   PROCEDURE: Left total hip arthroplasty.   ANESTHESIA: Spinal.   IMPLANTS UTILIZED: DePuy 16.5 mm small stature AML femoral stem, 50 mm outer diameter Pinnacle 100 acetabular component, +4 mm 10 degree Pinnacle Marathon polyethylene liner, and a 32 mm cobalt chrome hip ball with a +1 mm neck length.   HOSPITAL COURSE: The patient tolerated the procedure very well. She had no complications. She was then taken to PAC-U where she was stabilized and then transferred to the orthopedic floor. She began receiving anticoagulation with Lovenox anticoagulation therapy of Lovenox 30 mg subcu q. 12 hours per anesthesia and pharmacy protocol. She was fitted with TED stockings bilaterally. These were allowed to be removed 1 hour  per 8 hour shift. The patient was also fitted with AVI compression foot pumps bilaterally set at 80 mmHg. Her calves have been nontender. There has been no evidence of any DVTs. Negative Homans sign. Heels were elevated off the bed using rolled towels.   Vital signs have been stable. She has been afebrile. Hemodynamically she was stable and no transfusions were given.   Physical therapy was initiated on day 1 for gait training and transfers. Upon being discharged, she was ambulating greater than 50 feet. This has been very slow. The first day it was basically bed to chair transfers. She has slowly improved during that timeframe. Occupational therapy was also initiated on day 1 for ADL and assistive devices. The patient has been somewhat a little bit confused during the first few days. Subsequently, her narcotics were discontinued and she was placed mainly on just Tylenol. She seemed to tolerate the pain very well with this.   The patient's IV, Foley, and Hemovac were DC'd on day 2 along with a dressing change. The wound was free of any drainage or signs of infection.   DISPOSITION: The patient is being discharged to skilled nursing facility in improved stable condition.   DISCHARGE INSTRUCTIONS: She will continue weight-bearing as tolerated. Continue using a walker until cleared by physical therapy to go to a quad cane. She may weight bear as tolerated.  Elevate the heels off the bed. Also pillows between knees when sitting or in bed. Continue with knee-high TED stockings bilaterally. These are allowed to be removed 1 hour per 8 hour shift. Incentive spirometer q. 1 hour while awake. Encourage cough and deep breathing  q. 2 hours while awake. She is placed on an ADA diet. Change the dressing as needed. Staples will need to be removed on February 23rd and apply benzoin and half-inch Steri-Strips. She is not to get the wound wet until the staples are removed. She has a follow-up appointment on March 24th at  9:00 a.m. with Dr. Ernest Pine. She is to call the clinic sooner for any temperatures of 101.5 or greater or excessive bleeding.   DISCHARGE MEDICATIONS: 1.  Benazepril/HCT 20/12.5 mg 1 tablet daily. 2.  Calcium carbonate 500 mg. 3.  Vitamin D 200 units 1 tablet b.i.d. with meals. 4.  Prevalite SF 4 grams daily. 5.  Senokot-S 1 tablet b.i.d.  6.  Mylanta DS 400 mg b.i.d. 7.  Multivitamin capsule 1 tablet daily.  8.  Omega-3 fatty acid capsule 1 gram b.i.d.  9.  Pantoprazole 40 mg b.i.d. 10.  Pravachol 20 mg at bedtime. 11.  Lovenox 30 mg q. 12 hours x14 days and then discontinue and begin taking one 81 mg enteric-coated aspirin.  12.  Glucotrol XL 10 mg before meal, breakfast. 13.  Insulin sliding scale, Novolin injections. 14.  Glucophage XL 1000 mg daily. 15.  Metformin 500 mg, use your schedule, every day at 5:00 p.m.  16.  Tylenol ES 500 to 1000 mg q. 4 hours p.r.n. 17.  Cepacol throat lozenges 1 tablet q. 6 hours p.r.n. 18.  Dulcolax suppositories 10 mg rectally daily p.r.n.  19.  Milk of magnesia 30 mL b.i.d.  20.  Tramadol 50 to 100 mg q. 4 to 6 hours p.r.n. for pain. 21.  Roxicodone 5 to 10 mg q. 4 to 6 hours p.r.n. for pain.  22.  Enema soapsuds if no results with milk of magnesia or Dulcolax.   PAST MEDICAL HISTORY: Hypertension, diabetes, hyperlipidemia, gastroesophageal reflux disease, TIA, hospitalized for elevated WBC and pneumonia June and October 2014.  ____________________________ Van Clines, PA jrw:sb D: 08/11/2013 07:59:23 ET T: 08/11/2013 08:31:43 ET JOB#: [id number]  cc: Van Clines, PA, <Dictator> Armani Brar PA ELECTRONICALLY SIGNED 08/15/2013 18:01

## 2014-10-20 NOTE — Op Note (Signed)
PATIENT NAME:  Hannah Conway, Hannah Conway MR#:  [id number] DATE OF BIRTH:  [birth year]  DATE OF PROCEDURE:  08/07/2013  PREOPERATIVE DIAGNOSIS: Degenerative arthrosis of the left hip.   POSTOPERATIVE DIAGNOSIS: Degenerative arthrosis of the left hip.   PROCEDURE PERFORMED: Left total hip arthroplasty.   SURGEON: Illene LabradorJames P. Hooten, Conway.D.   ASSISTANT: Van ClinesJon Wolfe, PA (required to maintain retraction throughout the procedure).   ANESTHESIA: Spinal.   ESTIMATED BLOOD LOSS: 200 mL.   FLUIDS REPLACED: 1300 mL of crystalloid.   DRAINS: Two medium drains to Hemovac reservoir.   IMPLANTS UTILIZED: DePuy 16.5 mm small stature AML femoral stem, 50 mm outer diameter Pinnacle 100 acetabular component, +4 mm 10 degrees Pinnacle Marathon polyethylene liner, and a 32 mm cobalt chrome hip ball with a +1 mm neck length.   INDICATIONS FOR SURGERY: The patient is an 81 year old female who has been seen for complaints of progressive left hip and groin pain. X-rays demonstrated severe degenerative changes. After discussion of the risks and benefits of surgical intervention, the patient expressed understanding of the risks, benefits, and agreed with plans for surgical intervention.   PROCEDURE IN DETAIL: The patient was brought into the Operating Room and after adequate spinal anesthesia was achieved, the patient was placed in a right lateral decubitus position. Axillary roll was placed and all bony prominences were well padded. The patient's left hip and leg were cleaned and prepped with alcohol and DuraPrep draped in the usual sterile fashion. A "timeout" was performed as per usual protocol. A lateral curvilinear incision was made gently curving towards the posterior superior iliac spine. IT band was incised with the skin incision. Fibers of the gluteus maximus were split in line. Piriformis tendon was identified, skeletonized, and incised at its insertion on the proximal femur and reflected posteriorly. In a similar fashion,  short external rotators were incised and reflected posteriorly. A T-type posterior capsulotomy was performed. Prior to dislocation of the femoral head, a threaded Steinmann pin was inserted through a separate stab incision into the pelvis superior to the acetabulum and bent in the form of a stylus so as to assess limb length and hip offset throughout the procedure. The femoral head was then dislocated posteriorly. Inspection of the femoral head demonstrated severe degenerative changes with full-thickness loss of articular cartilage. The femoral neck cut was performed using an oscillating saw. The anterior capsule was elevated off of the femoral neck. Inspection of the acetabulum also demonstrated severe degenerative changes. Remnant of the labrum was excised. The acetabulum was reamed in a sequential fashion up to a 49 mm diameter. Good punctate bleeding bone was encountered. A moderate size cyst was cleared of soft tissue, and packed with cancellous bone retrieved from the femoral head. A 50 mm outer diameter Pinnacle 100 acetabular component was positioned and impacted into place. Excellent scratch fit was appreciated. A +4 mm neutral polyethylene trial was inserted, and attention was directed to the proximal femur. Pilot hole for reaming of the proximal femoral canal was created using a high-speed bur. Proximal femoral canal was then reamed in a sequential fashion up to a 16 mm diameter. This allowed for approximately 4 to 5 cm of scratch fit. The proximal femur was then prepared using a 16.5 mm aggressive side-biting reamer. Serial broaches were inserted up to a 16.5 mm small stature broach. The calcar region was planed and trial reduction was performed with a 32 mm hip ball with a +1 mm neck length. Good hip offset was appreciated and good  equalization of limb lengths was noted. Reasonably good stability was appreciated,  it was elected to trial with a 10 degree liner with the high side at approximately four  o'clock position. This allowed for improved posterior stability. Trial components were removed. The acetabular shell was irrigated and suctioned dry. A +4 mm 10 degrees Pinnacle Marathon polyethylene liner was positioned with the high side at four o'clock position and impacted into place. Next, a 16.5 mm small stature AML femoral component was positioned and impacted into place. Excellent scratch fit was appreciated. The Morse tape was cleaned and dried. A 32 mm cobalt chrome hip ball with a +1 mm neck length was placed on the trunnion and impacted into place. The hip was reduced and placed through a range of motion. Excellent stability was appreciated both anteriorly and posteriorly. Good hip offset was appreciated and good equalization of limb lengths are noted.   The wound was irrigated with copious amounts of normal saline with antibiotic solution using pulsatile lavage and then suctioned dry. The posterior capsulotomy was repaired using #5 Ethibond. The piriformis tendon was reapproximated on the surface of the gluteus medius tendon using #5 Ethibond. Two medium drains were placed in the wound bed and brought out through a separate stab incision to be attached to a Hemovac reservoir. IT band was repaired using interrupted sutures of #1 Vicryl. The subcutaneous tissue was approximated in layers using first #0 Vicryl followed by 2-0 Vicryl. Skin was closed with skin staples. A sterile dressing was applied.   The patient tolerated the procedure well. She was transported to the recovery room in stable condition.   ____________________________ Illene Labrador. Angie Fava., MD jph:sg D: 08/07/2013 20:41:48 ET T: 08/08/2013 06:36:32 ET JOB#: [id number]  cc: Illene Labrador. Angie Fava., MD, <Dictator> JAMES P Angie Fava MD ELECTRONICALLY SIGNED 08/18/2013 6:42

## 2014-10-28 NOTE — Op Note (Signed)
PATIENT NAME:  Hannah Conway, Hannah Conway MR#:  [id number] DATE OF BIRTH:  Jan 16, 1930  DATE OF PROCEDURE:  10/04/2014  PREOPERATIVE DIAGNOSIS:  Nuclear sclerotic cataract, right eye.  POSTOPERATIVE DIAGNOSIS:  Nuclear sclerotic cataract, right eye.  PROCEDURE:  Phacoemulsification with posterior chamber intraocular lens right eye, model [id number]  SURGEON:  Lia HoppingNisha Aubrei Bouchie, MD  INDICATIONS:  This is an 81 year old female with decreased vision in the right eye.  PROCEDURE:  The risks and benefits of cataract surgery were discussed at length with the patient, including bleeding, infection, retinal detachment, re-operation, diplopia, ptosis, loss of vision, and loss of the eye. Informed consent was obtained. On the day of surgery, several sets of preoperative medication were administered to the operative eye including 0.5% tetracaine,1% cyclopentolate, 10% phenylephrine, 0.5% ketorolac, 0.5% gatifloxacin, and 2% lidocaine .  The patient was taken to the operating room and sedated via IV sedation. Topical tetracaine was placed in the eye. The operative eye was prepped using a  dilute 10% Betadine solution and then covered in sterile drapes leaving only the operative eye exposed. A Lieberman lid speculum was placed to provide exposure. Using 0.12 forceps and a sideport blade, a paracentesis was created. Then a mixture of BSS, preservative free lidocaine, and epinephrine was injected into the anterior chamber.   At this point, the patient's movement was noted to be extremely excessive, with patient moving head off the operating table and around under the operating microscope, despite multiple attempts to redirect the patient, as well as to alter sedation medications on the patient.  At this point, due to patient safety concerns, it was decided that the case be aborted and potentially re-attempted at a later date under general anesthesia.  At the end of the case, cefuroxime was administered topically.          ____________________________ Lia HoppingNisha Fleeta Kunde, MD nm:AT D: 10/04/2014 10:16:00 ET T: 10/04/2014 14:20:13 ET JOB#: [id number]  cc: Lia HoppingNisha Calee Nugent, MD, <Dictator> Lia HoppingNISHA Daiveon Markman MD ELECTRONICALLY SIGNED 10/08/2014 9:20

## 2016-06-18 DIAGNOSIS — I700 Atherosclerosis of aorta: Secondary | ICD-10-CM | POA: Insufficient documentation

## 2016-12-23 DIAGNOSIS — H353130 Nonexudative age-related macular degeneration, bilateral, stage unspecified: Secondary | ICD-10-CM | POA: Insufficient documentation

## 2016-12-23 DIAGNOSIS — K529 Noninfective gastroenteritis and colitis, unspecified: Secondary | ICD-10-CM | POA: Insufficient documentation

## 2017-09-10 DIAGNOSIS — H2513 Age-related nuclear cataract, bilateral: Secondary | ICD-10-CM | POA: Insufficient documentation

## 2017-09-10 DIAGNOSIS — H35433 Paving stone degeneration of retina, bilateral: Secondary | ICD-10-CM | POA: Insufficient documentation

## 2018-02-03 DIAGNOSIS — I739 Peripheral vascular disease, unspecified: Secondary | ICD-10-CM | POA: Insufficient documentation

## 2018-02-05 ENCOUNTER — Emergency Department
Admission: EM | Admit: 2018-02-05 | Discharge: 2018-02-05 | Disposition: A | Discharge status: Home / Self Care | Payer: Medicare Other | Attending: Emergency Medicine | Admitting: Emergency Medicine

## 2018-02-05 ENCOUNTER — Encounter: Payer: Self-pay | Admitting: Emergency Medicine

## 2018-02-05 DIAGNOSIS — F17210 Nicotine dependence, cigarettes, uncomplicated: Secondary | ICD-10-CM | POA: Insufficient documentation

## 2018-02-05 DIAGNOSIS — R21 Rash and other nonspecific skin eruption: Secondary | ICD-10-CM | POA: Diagnosis present

## 2018-02-05 DIAGNOSIS — Z7982 Long term (current) use of aspirin: Secondary | ICD-10-CM | POA: Insufficient documentation

## 2018-02-05 DIAGNOSIS — B029 Zoster without complications: Secondary | ICD-10-CM | POA: Diagnosis not present

## 2018-02-05 DIAGNOSIS — Z79899 Other long term (current) drug therapy: Secondary | ICD-10-CM | POA: Insufficient documentation

## 2018-02-05 HISTORY — DX: Peripheral vascular disease, unspecified: I73.9

## 2018-02-05 MED ORDER — OXYCODONE-ACETAMINOPHEN 5-325 MG PO TABS: 1.0000 | ORAL_TABLET | ORAL | 0 refills | Status: DC | PRN

## 2018-02-05 MED ORDER — HYDROMORPHONE HCL 1 MG/ML IJ SOLN
0.5000 mg | Freq: Once | INTRAMUSCULAR | Status: AC
  Administered 2018-02-05: 0.5 mg via INTRAVENOUS
  Filled 2018-02-05: qty 1

## 2018-02-05 MED ORDER — VALACYCLOVIR HCL 500 MG PO TABS
1000.0000 mg | ORAL_TABLET | Freq: Once | ORAL | Status: AC
  Administered 2018-02-05: 1000 mg via ORAL
  Filled 2018-02-05: qty 2

## 2018-02-05 MED ORDER — ONDANSETRON HCL 4 MG/2ML IJ SOLN
4.0000 mg | Freq: Once | INTRAMUSCULAR | Status: AC
  Administered 2018-02-05: 4 mg via INTRAVENOUS
  Filled 2018-02-05: qty 2

## 2018-02-05 MED ORDER — VALACYCLOVIR HCL 1 G PO TABS: 1000.0000 mg | ORAL_TABLET | Freq: Three times a day (TID) | ORAL | 0 refills | Status: DC

## 2018-02-05 MED ORDER — CAPSAICIN-MENTHOL-METHYL SAL 0.025-1-12 % EX CREA: 1.0000 "application " | TOPICAL_CREAM | Freq: Three times a day (TID) | CUTANEOUS | 3 refills | Status: DC

## 2018-02-05 NOTE — ED Triage Notes (Signed)
Patient presents to the ED with a severe headache to the back of her head radiating down her neck and into her left arm.  Patient states, "my daughter saw a rash on my left arm when she was helping me take a shower yesterday."  Patient states she has been using icy/hot without relief.  Patient denies history of similar symptoms.

## 2018-02-05 NOTE — ED Notes (Signed)
Discussed patient with Dr. Darnelle CatalanMalinda.  No orders at this time.

## 2018-02-05 NOTE — Discharge Instructions (Signed)
Please take the valacyclovir 1 pill 3 times a day.  This will help treat the virus.  Please take the Percocet 1 pill 4 times a day for pain.  If 1 pill 4 times a day is not enough you can try 2 pills 4 times a day.  Be careful the Percocet can make you groggy do not fall, also do not drive on it.  Percocet can also make you constipated.  If you are having diarrhea this may not be a problem.  Otherwise make sure you are getting up and walking and moving drinking plenty of fluid and eating a lot of fiber like prunes.  You can try the capsaicin cream apply a small amount in the area of the rash 3 times a day.  Rub it in well.  Do not apply it on broken skin.  Some people have more pain with it.  This often will improve after day or 2.  If it does not or if you get skin irritation please stop the capsaicin cream immediately.  Please return here for increased redness fever diffuse headache or any other worsening.  Please follow-up with your doctor in a week or 2 to see how you are doing.  Be careful not to get around any babies or pregnant women as the rash can give them chickenpox at any time until all the lesions are scabbed.

## 2018-02-08 NOTE — ED Provider Notes (Signed)
[facility] Emergency Department Provider Note   ____________________________________________   First MD Initiated Contact with Patient 02/05/18 1256     (approximate)  I have reviewed the triage vital signs and the nursing notes.   HISTORY  Chief Complaint Headache and Rash    HPI Hannah Conway is a 84 y.o. female patient complains of a headache starting at the back of her neck and the pain runs down left her arm.  Shortly after this started she developed a rash.  She is not running a fever not having chills and has a supple neck having no other problems.  Pain is quite intense.  It is aching and boring.  Past Medical History:  Diagnosis Date  . Peripheral artery disease (HCC)     There are no active problems to display for this patient.   History reviewed. No pertinent surgical history.  Prior to Admission medications   Medication Sig Start Date End Date Taking? Authorizing Provider  aspirin EC 81 MG tablet Take 1 tablet by mouth daily.    [provider]  benazepril-hydrochlorthiazide (LOTENSIN HCT) 20-12.5 MG tablet Take 1 tablet by mouth daily. 11/27/17   [provider]  Capsaicin-Menthol-Methyl Sal (CAPSAICIN-METHYL SAL-MENTHOL) 0.025-1-12 % CREA Apply 1 application topically 3 (three) times daily. Apply a thin film to intact skin 3 x a day.  Discontinue if skin irritation occurs 02/05/18   Arnaldo NatalMalinda, Paul F, MD  NOVOLOG MIX 70/30 FLEXPEN (70-30) 100 UNIT/ML FlexPen Inject 25 Units into the skin 2 (two) times daily. 02/01/18   [provider]  oxyCODONE-acetaminophen (PERCOCET) 5-325 MG tablet Take 1 tablet by mouth every 4 (four) hours as needed for severe pain. 02/05/18 02/05/19  Arnaldo NatalMalinda, Paul F, MD  pravastatin (PRAVACHOL) 20 MG tablet Take 1 tablet by mouth daily. 12/17/17   [provider]  valACYclovir (VALTREX) 1000 MG tablet Take 1 tablet (1,000 mg total) by mouth 3 (three) times daily. 02/05/18   Arnaldo NatalMalinda, Paul F,  MD    Allergies Patient has no known allergies.  No family history on file.  Social History Social History   Tobacco Use  . Smoking status: Current Every Day Smoker    Packs/day: 1.00    Types: Cigarettes  . Smokeless tobacco: Never Used  Substance Use Topics  . Alcohol use: Not on file  . Drug use: Not on file    Review of Systems  Constitutional: No fever/chills Eyes: No visual changes. ENT: No sore throat. Cardiovascular: Denies chest pain. Respiratory: Denies shortness of breath. Gastrointestinal: No abdominal pain.  No nausea, no vomiting.  No diarrhea.  No constipation. Genitourinary: Negative for dysuria. Musculoskeletal: Negative for back pain. Skin: Negative for rash. Neurological: Negative for  focal weakness ____________________________________________   PHYSICAL EXAM:  VITAL SIGNS: ED Triage Vitals [02/05/18 1122]  Enc Vitals Group     BP (!) 158/48     Pulse Rate 69     Resp 18     Temp 98.7 F (37.1 C)     Temp Source Oral     SpO2 96 %     Weight 156 lb (70.8 kg)     Height 5\' 3"  (1.6 m)     Head Circumference      Peak Flow      Pain Score 9     Pain Loc      Pain Edu?      Excl. in GC?     Constitutional: Alert and oriented. Well appearing  and in no acute distress. Eyes: Conjunctivae are normal.  Head: Atraumatic. Nose: No congestion/rhinnorhea. Mouth/Throat: Mucous membranes are moist.  Oropharynx non-erythematous. Neck: No stridor.  Cardiovascular: Normal rate, regular rhythm. Grossly normal heart sounds.  Good peripheral circulation. Respiratory: Normal respiratory effort.  No retractions. Lungs CTAB. Gastrointestinal: Soft and nontender. No distention. No abdominal bruits. No CVA tenderness. Musculoskeletal: No lower extremity tenderness nor edema.  No joint effusions. Neurologic:  Normal speech and language. No gross focal neurologic deficits are appreciated. No gait instability. Skin:  Skin is warm, dry and intact.  Patient  has a rash consistent with shingles starting at the back of the neck and running down the arm below the elbow in a dermatomal pattern.  This is the etiology am sure the patient's pain. Psychiatric: Mood and affect are normal. Speech and behavior are normal.  ____________________________________________   LABS (all labs ordered are listed, but only abnormal results are displayed)  Labs Reviewed - No data to display ____________________________________________  EKG   ____________________________________________  RADIOLOGY  ED MD interpretation:   Official radiology report(s): No results found.  ____________________________________________   PROCEDURES  Procedure(s) performed:   Procedures  Critical Care performed:   ____________________________________________   INITIAL IMPRESSION / ASSESSMENT AND PLAN / ED COURSE  Clinically the patient has shingles headache is not involving the whole head only the back of the neck.  We will treat her for shingles and discharge her.  I have discussed the possible outcomes with the patient.      ____________________________________________   FINAL CLINICAL IMPRESSION(S) / ED DIAGNOSES  Final diagnoses:  Herpes zoster without complication     ED Discharge Orders         Ordered    Capsaicin-Menthol-Methyl Sal (CAPSAICIN-METHYL SAL-MENTHOL) 0.025-1-12 % CREA  3 times daily     02/05/18 1323    valACYclovir (VALTREX) 1000 MG tablet  3 times daily     02/05/18 1327    oxyCODONE-acetaminophen (PERCOCET) 5-325 MG tablet  Every 4 hours PRN     02/05/18 1327           Note:  This document was prepared using Dragon voice recognition software and may include unintentional dictation errors.    Arnaldo NatalMalinda, Paul F, MD 02/08/18 [phone]

## 2018-07-25 ENCOUNTER — Ambulatory Visit (INDEPENDENT_AMBULATORY_CARE_PROVIDER_SITE_OTHER): Payer: Medicare Other | Admitting: Nurse Practitioner

## 2018-07-25 ENCOUNTER — Encounter (INDEPENDENT_AMBULATORY_CARE_PROVIDER_SITE_OTHER): Payer: Self-pay | Admitting: Nurse Practitioner

## 2018-07-25 VITALS — BP 179/57 | HR 79 | Resp 18 | Ht 63.0 in | Wt 160.4 lb

## 2018-07-25 DIAGNOSIS — K219 Gastro-esophageal reflux disease without esophagitis: Secondary | ICD-10-CM

## 2018-07-25 DIAGNOSIS — E1169 Type 2 diabetes mellitus with other specified complication: Secondary | ICD-10-CM

## 2018-07-25 DIAGNOSIS — E785 Hyperlipidemia, unspecified: Secondary | ICD-10-CM

## 2018-07-25 DIAGNOSIS — I70219 Atherosclerosis of native arteries of extremities with intermittent claudication, unspecified extremity: Secondary | ICD-10-CM | POA: Insufficient documentation

## 2018-07-25 DIAGNOSIS — F17210 Nicotine dependence, cigarettes, uncomplicated: Secondary | ICD-10-CM

## 2018-07-25 DIAGNOSIS — I70213 Atherosclerosis of native arteries of extremities with intermittent claudication, bilateral legs: Secondary | ICD-10-CM | POA: Diagnosis not present

## 2018-07-25 DIAGNOSIS — N183 Chronic kidney disease, stage 3 unspecified: Secondary | ICD-10-CM

## 2018-07-25 DIAGNOSIS — E1122 Type 2 diabetes mellitus with diabetic chronic kidney disease: Secondary | ICD-10-CM | POA: Diagnosis not present

## 2018-07-25 DIAGNOSIS — Z794 Long term (current) use of insulin: Secondary | ICD-10-CM

## 2018-07-25 NOTE — Progress Notes (Signed)
Subjective:    Patient ID: Hannah Conway, female    DOB: 10-24-1929, 85 y.o.   MRN: [id number] Chief Complaint  Patient presents with  . Establish Care    HPI  Hannah DollarBetty M Brents is a 85 y.o. female that presents today after referral from Dr. Darrold JunkerParaschos with concern for intermittent claudication.  Previous records show that she was last seen in our office in 2013 however there is no record of previous vascular intervention.  The patient herself also denies any history of vascular interventions.  The patient is present with her daughter, that also has had a history of peripheral arterial disease with multiple stents in her lower extremities.  She states that they have a strong family history of peripheral arterial disease with multiple family members undergoing interventions including amputations.  The patient states that both of her parents had heart disease.  The patient states that she has intense pain in her calves which persist if she stands for long periods or tries to walk.  Her mobility has recently gotten much worse to where she now needs a walker for ambulation.  She states that her right leg is more painful than her left.  She denies any rest pain like symptoms.  She denies having any lower extremity wounds.  She denies any chest pain or shortness of breath.  She denies any fever, chills, nausea, vomiting or diarrhea.  The patient is a current 1 pack a day smoker.  The patient underwent bilateral ABIs at Dr. Darrold JunkerParaschos office which revealed an ABI of 0.69 of her right lower extremity and 0.56 of her left.  She has a TBI of 0.42 bilaterally.  She has monophasic waveforms present in her bilateral tibial arteries.  Past Medical History:  Diagnosis Date  . Peripheral artery disease (HCC)     History reviewed. No pertinent surgical history.  Social History   Socioeconomic History  . Marital status: Married    Spouse name: Not on file  . Number of children: Not on file  . Years of  education: Not on file  . Highest education level: Not on file  Occupational History  . Not on file  Social Needs  . Financial resource strain: Not on file  . Food insecurity:    Worry: Not on file    Inability: Not on file  . Transportation needs:    Medical: Not on file    Non-medical: Not on file  Tobacco Use  . Smoking status: Current Every Day Smoker    Packs/day: 1.00    Types: Cigarettes  . Smokeless tobacco: Never Used  Substance and Sexual Activity  . Alcohol use: Not on file  . Drug use: Not on file  . Sexual activity: Not on file  Lifestyle  . Physical activity:    Days per week: Not on file    Minutes per session: Not on file  . Stress: Not on file  Relationships  . Social connections:    Talks on phone: Not on file    Gets together: Not on file    Attends religious service: Not on file    Active member of club or organization: Not on file    Attends meetings of clubs or organizations: Not on file    Relationship status: Not on file  . Intimate partner violence:    Fear of current or ex partner: Not on file    Emotionally abused: Not on file    Physically abused: Not on file  Forced sexual activity: Not on file  Other Topics Concern  . Not on file  Social History Narrative  . Not on file    History reviewed. No pertinent family history.  Allergies  Allergen Reactions  . Atorvastatin Other (See Comments)  . Simvastatin Other (See Comments)     Review of Systems   Review of Systems: Negative Unless Checked Constitutional: [] Weight loss  [] Fever  [] Chills Cardiac: [] Chest pain   []  Atrial Fibrillation  [] Palpitations   [] Shortness of breath when laying flat   [] Shortness of breath with exertion. [] Shortness of breath at rest Vascular:  [x] Pain in legs with walking   [] Pain in legs with standing [] Pain in legs when laying flat   [x] Claudication    [] Pain in feet when laying flat    [] History of DVT   [] Phlebitis   [] Swelling in legs   [] Varicose  veins   [] Non-healing ulcers Pulmonary:   [] Uses home oxygen   [] Productive cough   [] Hemoptysis   [] Wheeze  [] COPD   [] Asthma Neurologic:  [] Dizziness   [] Seizures  [] Blackouts [] History of stroke   [] History of TIA  [] Aphasia   [] Temporary Blindness   [] Weakness or numbness in arm   [] Weakness or numbness in leg Musculoskeletal:   [] Joint swelling   [] Joint pain   [] Low back pain  []  History of Knee Replacement [] Arthritis [] back Surgeries  []  Spinal Stenosis    Hematologic:  [] Easy bruising  [] Easy bleeding   [] Hypercoagulable state   [] Anemic Gastrointestinal:  [x] Diarrhea   [] Vomiting  [] Gastroesophageal reflux/heartburn   [] Difficulty swallowing. [] Abdominal pain Genitourinary:  [] Chronic kidney disease   [] Difficult urination  [] Anuric   [] Blood in urine [] Frequent urination  [] Burning with urination   [] Hematuria Skin:  [] Rashes   [] Ulcers [] Wounds Psychological:  [x] History of anxiety   []  History of major depression  []  Memory Difficulties     Objective:   Physical Exam  BP (!) 179/57 (BP Location: Right Arm, Patient Position: Sitting, Cuff Size: Normal)   Pulse 79   Resp 18   Ht 5\' 3"  (1.6 m)   Wt 160 lb 6.4 oz (72.8 kg)   BMI 28.41 kg/m   Gen: WD/WN, NAD, anxious Head: [place]/AT, No temporalis wasting. Poor dentition Ear/Nose/Throat: Hearing grossly intact, nares w/o erythema or drainage Eyes: PER, EOMI, sclera nonicteric.  Neck: Supple, no masses.  No JVD.  Pulmonary:  Good air movement, no use of accessory muscles.  Cardiac: RRR Vascular:  Vessel Right Left  Radial Palpable Palpable  Dorsalis Pedis Not Palpable Not Palpable  Posterior Tibial Not Palpable Not Palpable   Gastrointestinal: soft, non-distended. No guarding/no peritoneal signs.  Musculoskeletal:uses walker for ambulation No deformity or atrophy.  Neurologic: Pain and light touch intact in extremities.  Symmetrical.  Speech is fluent. Motor exam as listed above. Psychiatric: Judgment intact, Mood & affect  appropriate for pt's clinical situation. Dermatologic: Toes cold, dusky colored No changes consistent with cellulitis. Lymph : No Cervical lymphadenopathy, no lichenification or skin changes of chronic lymphedema.      Assessment & Plan:   1. Atherosclerosis of native artery of both lower extremities with intermittent claudication [facility]) The patient underwent bilateral ABIs at Dr. Darrold Junker office which revealed an ABI of 0.69 of her right lower extremity and 0.56 of her left.  She has a TBI of 0.42 bilaterally.  She has monophasic waveforms present in her bilateral tibial arteries.  Recommend:  The patient has experienced increased symptoms and is now describing lifestyle limiting  claudication and mild rest pain.   Given the severity of the patient's lower extremity symptoms the patient should undergo angiography and intervention.  Risk and benefits were reviewed the patient.  Indications for the procedure were reviewed.  All questions were answered, the patient agrees to proceed.   The patient should continue walking and begin a more formal exercise program.  The patient should continue antiplatelet therapy and aggressive treatment of the lipid abnormalities  The patient will follow up with me after the angiogram.   2. Gastroesophageal reflux disease, esophagitis presence not specified Continue PPI as already ordered, this medication has been reviewed and there are no changes at this time.  Avoidence of caffeine and alcohol  Moderate elevation of the head of the bed   3. Hyperlipidemia associated with type 2 diabetes mellitus (HCC) Continue statin as ordered and reviewed, no changes at this time   4. Type 2 diabetes mellitus with stage 3 chronic kidney disease, with long-term current use of insulin (HCC) Continue hypoglycemic medications as already ordered, these medications have been reviewed and there are no changes at this time.  Hgb A1C to be monitored as already arranged by  primary service    Current Outpatient Medications on File Prior to Visit  Medication Sig Dispense Refill  . aspirin EC 81 MG tablet Take 1 tablet by mouth daily.    . benazepril-hydrochlorthiazide (LOTENSIN HCT) 20-12.5 MG tablet Take 1 tablet by mouth daily.    Marland Kitchen buPROPion HCl (WELLBUTRIN PO) Take by mouth.    Marland Kitchen NOVOLOG MIX 70/30 FLEXPEN (70-30) 100 UNIT/ML FlexPen Inject 25 Units into the skin 2 (two) times daily.  1  . pravastatin (PRAVACHOL) 20 MG tablet Take 1 tablet by mouth daily.    Marland Kitchen oxyCODONE-acetaminophen (PERCOCET) 5-325 MG tablet Take 1 tablet by mouth every 4 (four) hours as needed for severe pain. (Patient not taking: Reported on 07/25/2018) 20 tablet 0   No current facility-administered medications on file prior to visit.     There are no Patient Instructions on file for this visit. No follow-ups on file.   Georgiana Spinner, NP  This note was completed with Office manager.  Any errors are purely unintentional.

## 2018-07-26 ENCOUNTER — Encounter (INDEPENDENT_AMBULATORY_CARE_PROVIDER_SITE_OTHER): Payer: Self-pay

## 2018-08-08 ENCOUNTER — Other Ambulatory Visit (INDEPENDENT_AMBULATORY_CARE_PROVIDER_SITE_OTHER): Payer: Self-pay | Admitting: Nurse Practitioner

## 2018-08-08 ENCOUNTER — Other Ambulatory Visit: Payer: Medicare Other

## 2018-08-08 MED ORDER — DEXTROSE 5 % IV SOLN
2.0000 g | Freq: Once | INTRAVENOUS | Status: DC
  Filled 2018-08-08: qty 20

## 2018-08-09 ENCOUNTER — Encounter
Admission: RE | Disposition: A | Discharge status: Home / Self Care | Payer: Self-pay | Source: Ambulatory Visit | Attending: Vascular Surgery

## 2018-08-09 ENCOUNTER — Other Ambulatory Visit: Payer: Self-pay

## 2018-08-09 ENCOUNTER — Observation Stay
Admission: RE | Admit: 2018-08-09 | Discharge: 2018-08-10 | Disposition: A | Discharge status: Home / Self Care | Payer: Medicare Other | Source: Ambulatory Visit | Attending: Vascular Surgery | Admitting: Vascular Surgery

## 2018-08-09 DIAGNOSIS — K219 Gastro-esophageal reflux disease without esophagitis: Secondary | ICD-10-CM | POA: Insufficient documentation

## 2018-08-09 DIAGNOSIS — N183 Chronic kidney disease, stage 3 (moderate): Secondary | ICD-10-CM | POA: Insufficient documentation

## 2018-08-09 DIAGNOSIS — E785 Hyperlipidemia, unspecified: Secondary | ICD-10-CM | POA: Diagnosis not present

## 2018-08-09 DIAGNOSIS — Z794 Long term (current) use of insulin: Secondary | ICD-10-CM | POA: Insufficient documentation

## 2018-08-09 DIAGNOSIS — S301XXA Contusion of abdominal wall, initial encounter: Secondary | ICD-10-CM | POA: Diagnosis present

## 2018-08-09 DIAGNOSIS — I70219 Atherosclerosis of native arteries of extremities with intermittent claudication, unspecified extremity: Secondary | ICD-10-CM

## 2018-08-09 DIAGNOSIS — E1169 Type 2 diabetes mellitus with other specified complication: Secondary | ICD-10-CM | POA: Insufficient documentation

## 2018-08-09 DIAGNOSIS — F17210 Nicotine dependence, cigarettes, uncomplicated: Secondary | ICD-10-CM | POA: Insufficient documentation

## 2018-08-09 DIAGNOSIS — E1122 Type 2 diabetes mellitus with diabetic chronic kidney disease: Secondary | ICD-10-CM | POA: Diagnosis not present

## 2018-08-09 DIAGNOSIS — I70223 Atherosclerosis of native arteries of extremities with rest pain, bilateral legs: Secondary | ICD-10-CM

## 2018-08-09 DIAGNOSIS — Z888 Allergy status to other drugs, medicaments and biological substances status: Secondary | ICD-10-CM | POA: Diagnosis not present

## 2018-08-09 DIAGNOSIS — Z79899 Other long term (current) drug therapy: Secondary | ICD-10-CM | POA: Insufficient documentation

## 2018-08-09 DIAGNOSIS — Z7982 Long term (current) use of aspirin: Secondary | ICD-10-CM | POA: Insufficient documentation

## 2018-08-09 DIAGNOSIS — R58 Hemorrhage, not elsewhere classified: Secondary | ICD-10-CM

## 2018-08-09 HISTORY — PX: LOWER EXTREMITY ANGIOGRAPHY: CATH118251

## 2018-08-09 LAB — BUN: BUN: 28 mg/dL — ABNORMAL HIGH (ref 8–23)

## 2018-08-09 LAB — CREATININE, SERUM
Creatinine, Ser: 1.04 mg/dL — ABNORMAL HIGH (ref 0.44–1.00)
GFR calc Af Amer: 56 mL/min — ABNORMAL LOW (ref >60)
GFR calc non Af Amer: 49 mL/min — ABNORMAL LOW (ref >60)

## 2018-08-09 LAB — GLUCOSE, CAPILLARY
Glucose-Capillary: 203 mg/dL — ABNORMAL HIGH (ref 70–99)
Glucose-Capillary: 175 mg/dL — ABNORMAL HIGH (ref 70–99)
Glucose-Capillary: 277 mg/dL — ABNORMAL HIGH (ref 70–99)

## 2018-08-09 SURGERY — LOWER EXTREMITY ANGIOGRAPHY
Anesthesia: Moderate Sedation | Site: Leg Lower | Laterality: Right

## 2018-08-09 MED ORDER — ACETAMINOPHEN 325 MG PO TABS
650.0000 mg | ORAL_TABLET | ORAL | Status: DC | PRN
  Administered 2018-08-09 – 2018-08-10 (×2): 650 mg via ORAL
  Filled 2018-08-09 (×2): qty 2

## 2018-08-09 MED ORDER — FAMOTIDINE 20 MG PO TABS: 40.0000 mg | ORAL_TABLET | Freq: Once | ORAL | Status: DC | PRN

## 2018-08-09 MED ORDER — SODIUM CHLORIDE 0.9% FLUSH
3.0000 mL | Freq: Two times a day (BID) | INTRAVENOUS | Status: DC
  Administered 2018-08-09 – 2018-08-10 (×2): 3 mL via INTRAVENOUS

## 2018-08-09 MED ORDER — FENTANYL CITRATE (PF) 100 MCG/2ML IJ SOLN
INTRAMUSCULAR | Status: AC
  Filled 2018-08-09: qty 2

## 2018-08-09 MED ORDER — LABETALOL HCL 5 MG/ML IV SOLN: 10.0000 mg | INTRAVENOUS | Status: DC | PRN

## 2018-08-09 MED ORDER — SODIUM CHLORIDE 0.9 % IV SOLN: INTRAVENOUS | Status: AC

## 2018-08-09 MED ORDER — ONDANSETRON HCL 4 MG/2ML IJ SOLN
INTRAMUSCULAR | Status: AC
  Filled 2018-08-09: qty 2

## 2018-08-09 MED ORDER — BUPROPION HCL ER (XL) 150 MG PO TB24
150.0000 mg | ORAL_TABLET | Freq: Every day | ORAL | Status: DC
  Administered 2018-08-09 – 2018-08-10 (×2): 150 mg via ORAL
  Filled 2018-08-09 (×2): qty 1

## 2018-08-09 MED ORDER — MIDAZOLAM HCL 2 MG/2ML IJ SOLN
INTRAMUSCULAR | Status: DC | PRN
  Administered 2018-08-09 (×3): 0.5 mg via INTRAVENOUS
  Administered 2018-08-09: 1 mg via INTRAVENOUS
  Administered 2018-08-09: 0.5 mg via INTRAVENOUS

## 2018-08-09 MED ORDER — HEPARIN SODIUM (PORCINE) 1000 UNIT/ML IJ SOLN
INTRAMUSCULAR | Status: DC | PRN
  Administered 2018-08-09: 4000 [IU] via INTRAVENOUS

## 2018-08-09 MED ORDER — HEPARIN SODIUM (PORCINE) 1000 UNIT/ML IJ SOLN
INTRAMUSCULAR | Status: AC
  Filled 2018-08-09: qty 1

## 2018-08-09 MED ORDER — CLOPIDOGREL BISULFATE 75 MG PO TABS
75.0000 mg | ORAL_TABLET | Freq: Every day | ORAL | Status: DC
  Administered 2018-08-10: 75 mg via ORAL
  Filled 2018-08-09: qty 1

## 2018-08-09 MED ORDER — MORPHINE SULFATE (PF) 4 MG/ML IV SOLN: 2.0000 mg | INTRAVENOUS | Status: DC | PRN

## 2018-08-09 MED ORDER — SODIUM CHLORIDE 0.9 % IV SOLN: 250.0000 mL | INTRAVENOUS | Status: DC | PRN

## 2018-08-09 MED ORDER — PRAVASTATIN SODIUM 20 MG PO TABS
20.0000 mg | ORAL_TABLET | Freq: Every evening | ORAL | Status: DC
  Administered 2018-08-09: 20 mg via ORAL
  Filled 2018-08-09: qty 1

## 2018-08-09 MED ORDER — ONDANSETRON HCL 4 MG/2ML IJ SOLN
4.0000 mg | Freq: Four times a day (QID) | INTRAMUSCULAR | Status: DC | PRN
  Administered 2018-08-09: 4 mg via INTRAVENOUS
  Filled 2018-08-09: qty 2

## 2018-08-09 MED ORDER — ONDANSETRON HCL 4 MG/2ML IJ SOLN: 4.0000 mg | Freq: Four times a day (QID) | INTRAMUSCULAR | Status: DC | PRN

## 2018-08-09 MED ORDER — OXYCODONE HCL 5 MG PO TABS: 5.0000 mg | ORAL_TABLET | ORAL | Status: DC | PRN

## 2018-08-09 MED ORDER — BENAZEPRIL-HYDROCHLOROTHIAZIDE 20-12.5 MG PO TABS: 1.0000 | ORAL_TABLET | Freq: Every day | ORAL | Status: DC

## 2018-08-09 MED ORDER — SODIUM CHLORIDE 0.9 % IV SOLN
INTRAVENOUS | Status: AC | PRN
  Administered 2018-08-09: 500 mL via INTRAVENOUS

## 2018-08-09 MED ORDER — INSULIN ASPART 100 UNIT/ML ~~LOC~~ SOLN
0.0000 [IU] | Freq: Three times a day (TID) | SUBCUTANEOUS | Status: DC
  Administered 2018-08-10: 5 [IU] via SUBCUTANEOUS
  Administered 2018-08-10: 3 [IU] via SUBCUTANEOUS
  Filled 2018-08-09 (×2): qty 1

## 2018-08-09 MED ORDER — CLOPIDOGREL BISULFATE 75 MG PO TABS
ORAL_TABLET | ORAL | Status: AC
  Filled 2018-08-09: qty 4

## 2018-08-09 MED ORDER — FENTANYL CITRATE (PF) 100 MCG/2ML IJ SOLN
INTRAMUSCULAR | Status: DC | PRN
  Administered 2018-08-09: 50 ug via INTRAVENOUS
  Administered 2018-08-09 (×5): 25 ug via INTRAVENOUS

## 2018-08-09 MED ORDER — DIPHENHYDRAMINE HCL 50 MG/ML IJ SOLN: 50.0000 mg | Freq: Once | INTRAMUSCULAR | Status: DC | PRN

## 2018-08-09 MED ORDER — MIDAZOLAM HCL 2 MG/ML PO SYRP: 8.0000 mg | ORAL_SOLUTION | Freq: Once | ORAL | Status: DC | PRN

## 2018-08-09 MED ORDER — MIDAZOLAM HCL 5 MG/5ML IJ SOLN
INTRAMUSCULAR | Status: AC
  Filled 2018-08-09: qty 5

## 2018-08-09 MED ORDER — BENAZEPRIL HCL 20 MG PO TABS
20.0000 mg | ORAL_TABLET | Freq: Every day | ORAL | Status: DC
  Administered 2018-08-10: 20 mg via ORAL
  Filled 2018-08-09: qty 1

## 2018-08-09 MED ORDER — HEPARIN (PORCINE) IN NACL 1000-0.9 UT/500ML-% IV SOLN
INTRAVENOUS | Status: AC
  Filled 2018-08-09: qty 1000

## 2018-08-09 MED ORDER — HYDRALAZINE HCL 20 MG/ML IJ SOLN: 5.0000 mg | INTRAMUSCULAR | Status: DC | PRN

## 2018-08-09 MED ORDER — LIDOCAINE-EPINEPHRINE (PF) 1 %-1:200000 IJ SOLN
INTRAMUSCULAR | Status: AC
  Filled 2018-08-09: qty 30

## 2018-08-09 MED ORDER — ADULT MULTIVITAMIN W/MINERALS CH
1.0000 | ORAL_TABLET | Freq: Every day | ORAL | Status: DC
  Administered 2018-08-09 – 2018-08-10 (×2): 1 via ORAL
  Filled 2018-08-09 (×2): qty 1

## 2018-08-09 MED ORDER — HYDROCHLOROTHIAZIDE 12.5 MG PO CAPS
12.5000 mg | ORAL_CAPSULE | Freq: Every day | ORAL | Status: DC
  Administered 2018-08-10: 12.5 mg via ORAL
  Filled 2018-08-09: qty 1

## 2018-08-09 MED ORDER — IOPAMIDOL (ISOVUE-300) INJECTION 61%
INTRAVENOUS | Status: DC | PRN
  Administered 2018-08-09: 145 mL via INTRA_ARTERIAL

## 2018-08-09 MED ORDER — ASPIRIN EC 81 MG PO TBEC
DELAYED_RELEASE_TABLET | ORAL | Status: AC
  Administered 2018-08-09: 81 mg
  Filled 2018-08-09: qty 1

## 2018-08-09 MED ORDER — SODIUM CHLORIDE 0.9% FLUSH: 3.0000 mL | INTRAVENOUS | Status: DC | PRN

## 2018-08-09 MED ORDER — SODIUM CHLORIDE 0.9 % IV SOLN: INTRAVENOUS | Status: DC

## 2018-08-09 MED ORDER — CEFAZOLIN SODIUM-DEXTROSE 2-4 GM/100ML-% IV SOLN
INTRAVENOUS | Status: AC
  Administered 2018-08-09: 12:00:00
  Filled 2018-08-09: qty 100

## 2018-08-09 MED ORDER — METHYLPREDNISOLONE SODIUM SUCC 125 MG IJ SOLR: 125.0000 mg | Freq: Once | INTRAMUSCULAR | Status: DC | PRN

## 2018-08-09 MED ORDER — OXYCODONE HCL 5 MG PO TABS: 5.0000 mg | ORAL_TABLET | Freq: Four times a day (QID) | ORAL | Status: DC | PRN

## 2018-08-09 MED ORDER — HYDROMORPHONE HCL 1 MG/ML IJ SOLN: 1.0000 mg | Freq: Once | INTRAMUSCULAR | Status: DC | PRN

## 2018-08-09 MED ORDER — ASPIRIN EC 81 MG PO TBEC
81.0000 mg | DELAYED_RELEASE_TABLET | Freq: Every day | ORAL | Status: DC
  Administered 2018-08-09 – 2018-08-10 (×2): 81 mg via ORAL
  Filled 2018-08-09 (×2): qty 1

## 2018-08-09 MED ORDER — CLOPIDOGREL BISULFATE 75 MG PO TABS
300.0000 mg | ORAL_TABLET | Freq: Once | ORAL | Status: AC
  Administered 2018-08-09: 300 mg via ORAL

## 2018-08-09 SURGICAL SUPPLY — 40 items
BALLN LUTONIX 5X150X130 (BALLOONS) ×3
BALLN LUTONIX 5X220X130 (BALLOONS) ×3
BALLN LUTONIX DCB 6X40X130 (BALLOONS) ×3
BALLN ULTRASCORE 4X150X130 (BALLOONS) ×3
BALLN ULTRVRSE 7X40X75C (BALLOONS) ×3
BALLOON LUTONIX 5X150X130 (BALLOONS) ×1 IMPLANT
BALLOON LUTONIX 5X220X130 (BALLOONS) ×1 IMPLANT
BALLOON LUTONIX DCB 6X40X130 (BALLOONS) ×1 IMPLANT
BALLOON ULTRASCORE 4X150X130 (BALLOONS) ×1 IMPLANT
BALLOON ULTRVRSE 7X40X75C (BALLOONS) ×1 IMPLANT
CATH BEACON 5 .035 65 RIM TIP (CATHETERS) ×3 IMPLANT
CATH PIG 70CM (CATHETERS) ×3 IMPLANT
CATH VERT 5FR 125CM (CATHETERS) ×3 IMPLANT
DEVICE PRESTO INFLATION (MISCELLANEOUS) ×6 IMPLANT
DEVICE SAFEGUARD 24CM (GAUZE/BANDAGES/DRESSINGS) ×3 IMPLANT
DEVICE STARCLOSE SE CLOSURE (Vascular Products) ×3 IMPLANT
DEVICE TORQUE .025-.038 (MISCELLANEOUS) ×3 IMPLANT
NEEDLE ENTRY 21GA 7CM ECHOTIP (NEEDLE) ×3 IMPLANT
PACK ANGIOGRAPHY (CUSTOM PROCEDURE TRAY) ×3 IMPLANT
SET INTRO CAPELLA COAXIAL (SET/KITS/TRAYS/PACK) ×9 IMPLANT
SHEATH ANL2 6FRX45 HC (SHEATH) ×3 IMPLANT
SHEATH BRITE TIP 5FRX11 (SHEATH) ×3 IMPLANT
SHEATH BRITE TIP 6FRX11 (SHEATH) ×6 IMPLANT
SHEATH FLEXOR ANSEL2 7FRX45 (SHEATH) ×6 IMPLANT
SHIELD RADPAD SCOOP 12X17 (MISCELLANEOUS) ×3 IMPLANT
STENT LIFESTAR 7X40X80 (Permanent Stent) ×3 IMPLANT
STENT LIFESTENT 5F 5X150X135 (Permanent Stent) ×3 IMPLANT
STENT LIFESTENT 5F 5X170X135 (Permanent Stent) ×3 IMPLANT
STENT LIFESTREAM 8X16X135 (Permanent Stent) IMPLANT
STENT LIFESTREAM 8X26X135 (Permanent Stent) ×3 IMPLANT
STENT LIFESTREAM 8X37X80 (Permanent Stent) ×6 IMPLANT
STENT VIABAHN 7X50X120 (Permanent Stent) ×2 IMPLANT
STENT VIABAHN 7X5X120 7FR (Permanent Stent) ×1 IMPLANT
TUBING CONTRAST HIGH PRESS 72 (TUBING) ×3 IMPLANT
WIRE AMPLATZ SSTIFF .035X260CM (WIRE) ×3 IMPLANT
WIRE AQUATRACK .035X260CM (WIRE) ×3 IMPLANT
WIRE G V18X300CM (WIRE) ×3 IMPLANT
WIRE HI TORQ VERSACORE 300 (WIRE) ×3 IMPLANT
WIRE J 3MM .035X145CM (WIRE) ×3 IMPLANT
WIRE MAGIC TORQUE 260C (WIRE) ×3 IMPLANT

## 2018-08-09 NOTE — Op Note (Signed)
[facility] Percutaneous Study/Intervention Procedural Note   Date of Surgery: 08/09/2018  Surgeon:  Katha Cabal, MD.  Pre-operative Diagnosis: Atherosclerotic occlusive disease bilateral lower extremities with lifestyle limiting claudication mild rest pain  Post-operative diagnosis: Same; hemorrhage from left external iliac artery  Procedure(s) Performed: 1. Introduction catheter into right lower extremity 3rd order catheter placement left femoral approach; introduction catheter into aorta right common femoral approach 2. Contrast injection right lower extremity for distal runoff   3. Percutaneous transluminal angioplasty and stent placement right superficial femoral artery and popliteal with life stents postdilated to 5 mm using Lutonix drug-eluting balloon 4. Percutaneous transluminal angioplasty and stent placement left external iliac with a 7 mm via bond in combination with a 8 mm life star stent.             5.  Percutaneous transluminal angioplasty and stent placement left common iliac artery with 8 mm lifestream stents             6.  Percutaneous transluminal angioplasty and stent placement right common iliac artery with an 8 mm lifestream stent.             7.  Star close closure right common femoral arteriotomy  Anesthesia: Conscious sedation was administered under my direct supervision by the interventional radiology RN. IV Versed plus fentanyl were utilized. Continuous ECG, pulse oximetry and blood pressure was monitored throughout the entire procedure.  Conscious sedation was for a total of 2 hours and 40 minutes.  Sheath: 6 [place] Ansell sheath left common femoral artery retrograde; 7 [place] Ansell sheath right common femoral artery retrograde  Contrast: 145 cc  Fluoroscopy Time: 18.0 minutes  Indications: Hannah Conway presents with severe leg pain with mild rest pain symptoms and known  atherosclerotic occlusive disease by both physical examination as well as noninvasive studies.  The risks and benefits for revascularization are reviewed all questions answered patient agrees to proceed.  Procedure: Hannah Conway is a 85 y.o. y.o. female who was identified and appropriate procedural time out was performed. The patient was then placed supine on the table and prepped and draped in the usual sterile fashion.   Ultrasound was placed in the sterile sleeve and the left groin was evaluated the left common femoral artery was echolucent and pulsatile indicating patency.  Image was recorded for the permanent record and under real-time visualization a microneedle was inserted into the common femoral artery microwire followed by a micro-sheath.  A J-wire was then advanced through the micro-sheath and a  5 [place] sheath was then inserted over a J-wire. J-wire was then advanced and a 5 French pigtail catheter was positioned at the level of T12. AP projection of the aorta was then obtained. Pigtail catheter was repositioned to above the bifurcation and LAO and RAO views of the pelvis were obtained.    Subsequently a pigtail catheter with the stiff angle Glidewire was used to cross the aortic bifurcation the catheter wire were advanced down into the right distal external iliac artery. Oblique view of the femoral bifurcation was then obtained and subsequently the wire was reintroduced and the pigtail catheter negotiated into the SFA representing third order catheter placement. Distal runoff was then performed.   Interpretation: The abdominal aorta is opacified with a bolus injection of contrast.  Although atherosclerotic changes are noted there are no hemodynamically significant lesions.  Bilateral nephrograms are seen normal size.  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  No evidence  of hemodynamically significant renal artery stenosis.  There are bilateral greater than 80% stenoses in  the common iliac arteries likely approaching 90% on the left given the poststenotic dilatation identified.  These are not ostial lesions as the distal aorta and origins of the iliac arteries appear to be free of hemodynamically significant stenosis.  The right external iliac artery demonstrates diffuse calcific disease but there are no hemodynamically significant lesions.  On the left there is a greater than 60% stenosis in the middle one third of the left external iliac artery.  The common femoral arteries are both patent bilaterally but are diffusely diseased as are the profunda femoris arteries bilaterally.  The right SFA demonstrates multiple lesions from its origin down to Hunter's canal.  Several of these lesions are greater than 90% for 5 more are greater than 70%.  At [facility] canal the popliteal artery appears more normal although there is diffuse disease there are no hemodynamically significant lesions.  The trifurcation is patent and there is three-vessel runoff to the foot with the anterior tibial appeared to be the dominant tibial to cross the ankle.  Based on these findings I elected to perform vascular intervention for limb salvage.  5000 units of heparin was then given and allowed to circulate and a 6 [place] Ansell sheath was advanced up and over the bifurcation and positioned in the femoral artery.  Although initially a versa core wire had been placed the sheath was buckling and an Amplatz wire was required and this then allowed the sheath to be advanced up and over the bifurcation.  With the tip of the Amplatz wire now in the posterior tibial artery the detector was repositioned over the SFA.  Initially, a 4 mm x 150 ultra score balloon was used to treat the SFA and above-knee popliteal from Hunter's canal all the way back to the origin.  3 separate inflations were required each inflation was to 10 to 12 atm for 1 minute.  Follow-up imaging demonstrated multiple areas of greater than 50%  residual stenosis unacceptable for stand-alone angioplasty and therefore to life stents were deployed beginning at [facility] canal and extending up to the origin of the SFA.  The initial life stent was a 5 x 200 and then a 5 x 150 was deployed more proximally covering the entire area that was treated.  Next, 5 mm Lutonix drug-eluting balloons were used to post dilate the stents.  2 separate inflations were required using a 5 x 220 and then a 5 x 150 Lutonix balloon.  Follow-up imaging now demonstrated less than 5% residual stenosis with preservation of the distal runoff.  At this point I planned to treat the common iliac artery lesions next.  My original plan was to treat in a fashion similar to the technique used for kissing balloons and therefore to access the right common femoral artery in a retrograde direction with a 7 [place] Pinnacle sheath and exchange the left Ansell sheath for a 7 French 11 cm Pinnacle sheath.  However attempts at the sheath exchange on the left were unsuccessful in spite of the Amplatz wire the sheath buckled.  It would not aspirate blood and small injection of contrast demonstrated extravasation indicating the sheath had passed outside the artery.  Therefore the sheath and Amplatz wire were removed and pressure was held.  In spite of this the patient's blood pressure sagged and she was noting significant pain in the left groin area.  Suspicious for continued bleeding from the left  artery I went ahead and accessed the right artery.  Ultrasound was delivered back to the field in a sterile sleeve.  Right common femoral artery was identified and imaged.  It was echolucent and pulsatile indicating patency.  Image was recorded for the permanent record.  Under direct ultrasound visualization after 1% lidocaine is been infiltrated in soft tissues access to the common femoral was obtained and a single pass in a microwire inserted under fluoroscopic guidance.  Micro-sheath followed by a J-wire  and then a 6 French 11 cm Pinnacle sheath.  Glidewire and rim catheter were then advanced.  The aortic bifurcation identified and then hooked.  Wire was then advanced down into the common femoral on the left and the catheter advanced into the mid iliac system.  Hand-injection of contrast demonstrated persistent extravasation.  Catheter and wire were then advanced into the SFA a 7 French Ansell sheath was then advanced up and over the bifurcation and positioned in the distal common iliac.  Catheter was then advanced over the wire down into the level of the SFA and a 0.018 wire advanced.  A 7 mm x 50 mm via bond stent was then advanced across the bleeding point this extended into the previously noted stenosis but not completely across the stenosis.  It was then postdilated with a 6 mm x 40 mm ultra versed balloon.  Immediate follow-up angiography demonstrated complete cessation of any bleeding.  Puncture site was now sealed.  However the external iliac stenosis was only partially covered and therefore a 8 mm x 40 mm life star stent was deployed extending from from the proximal edge of the via bond stent toward the iliac bifurcation but not up to the iliac bifurcation.  It was then dilated with a 6 mm Lutonix drug-eluting balloon to 10 atm for 1 minute.  Follow-up imaging now demonstrated complete resolution of the stenosis less than 5% residual stenosis as well as complete cessation of bleeding.  Attention was then turned to the common iliac lesions.  Working through the right common femoral sheath the tip of the sheath was repositioned at the origin of the left iliac and magnified imaging was obtained.  After appropriate size measurements 8 mm diameter lifestream stent was selected however the distance required from the origin to the iliac bifurcation was too short for a 58 length stent but too long for a 37 and therefore I elected to use an 8 x 37 lifestream stent and extend with an 8 x 26 lifestream stent.   Follow-up imaging demonstrated an excellent result with complete resolution of the 90% stenosis, there is less than 5% residual stenosis.  Having successfully treated the left common iliac I now pulled the sheath back into the distal right common iliac again magnified hand imaging allowed for appropriate measurements and an 8 mm x 37 mm lifestream stent was selected and deployed without difficulty.  Follow-up imaging with a catheter in the distal aorta demonstrated both common iliac lesions are now well treated with less than 5% residual stenosis.   After review of these images the sheath is pulled into the right external iliac oblique of the common femoral is obtained and a Star close device deployed. There no immediate complications.   Findings:  The abdominal aorta is opacified with a bolus injection of contrast.  Although atherosclerotic changes are noted there are no hemodynamically significant lesions.  Bilateral nephrograms are seen normal size.  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  No evidence of  hemodynamically significant renal artery stenosis.  There are bilateral greater than 80% stenoses in the common iliac arteries likely approaching 90% on the left given the poststenotic dilatation identified.  These are not ostial lesions as the distal aorta and origins of the iliac arteries appear to be free of hemodynamically significant stenosis.  The right external iliac artery demonstrates diffuse calcific disease but there are no hemodynamically significant lesions.  On the left there is a greater than 60% stenosis in the middle one third of the left external iliac artery.  The common femoral arteries are both patent bilaterally but are diffusely diseased as are the profunda femoris arteries bilaterally.  The right SFA demonstrates multiple lesions from its origin down to Hunter's canal.  Several of these lesions are greater than 90% for 5 more are greater than 70%.  At [facility]  canal the popliteal artery appears more normal although there is diffuse disease there are no hemodynamically significant lesions.  The trifurcation is patent and there is three-vessel runoff to the foot with the anterior tibial appeared to be the dominant tibial to cross the ankle.  Following angioplasty right SFA and above-knee now is in-line flow and looks quite nice from the origin to Hunter's canal with less than 5% residual stenosis throughout that length.  As noted above treatment of the left external iliac artery with both a via bond stent which simultaneously excluded the hemorrhage but also partially treated the stenosis and then extending this with a life star stent resulted in an excellent result with less than 5% residual stenosis.  Angioplasty and stent placement of the common iliac arteries with Lifestream stents dilated to 8 mm diameter yielded an excellent result with less than 5% residual stenosis.  In summary the right common iliac and SFA above-knee popliteal have been successfully treated for occlusive disease.  The left common iliac has been successfully treated for occlusive disease.  The left external iliac artery has been successfully treated for both occlusive disease as well as a hemorrhagic complication of sheath insertion.   Summary: Successful recanalization right and left lower extremity for limb salvage    Disposition: Patient was taken to the recovery room in stable condition having tolerated the procedure well.  Basel Defalco, Dolores Lory 08/09/2018,3:18 PM

## 2018-08-09 NOTE — H&P (Signed)
[facility] History & Physical Update  The patient was interviewed and re-examined.  The patient's previous History and Physical has been reviewed and is unchanged.  There is no change in the plan of care. We plan to proceed with the scheduled procedure.  Levora Dredge, MD  08/09/2018, 10:36 AM

## 2018-08-10 ENCOUNTER — Encounter: Payer: Self-pay | Admitting: Vascular Surgery

## 2018-08-10 ENCOUNTER — Telehealth (INDEPENDENT_AMBULATORY_CARE_PROVIDER_SITE_OTHER): Payer: Self-pay

## 2018-08-10 DIAGNOSIS — I70219 Atherosclerosis of native arteries of extremities with intermittent claudication, unspecified extremity: Secondary | ICD-10-CM | POA: Diagnosis not present

## 2018-08-10 DIAGNOSIS — Z95820 Peripheral vascular angioplasty status with implants and grafts: Secondary | ICD-10-CM | POA: Diagnosis not present

## 2018-08-10 DIAGNOSIS — I70223 Atherosclerosis of native arteries of extremities with rest pain, bilateral legs: Secondary | ICD-10-CM | POA: Diagnosis not present

## 2018-08-10 LAB — BASIC METABOLIC PANEL
Anion gap: 7 (ref 5–15)
BUN: 29 mg/dL — ABNORMAL HIGH (ref 8–23)
CO2: 28 mmol/L (ref 22–32)
Calcium: 9.3 mg/dL (ref 8.9–10.3)
Chloride: 99 mmol/L (ref 98–111)
Creatinine, Ser: 1.16 mg/dL — ABNORMAL HIGH (ref 0.44–1.00)
GFR calc non Af Amer: 43 mL/min — ABNORMAL LOW (ref >60)
GFR, EST AFRICAN AMERICAN: 49 mL/min — AB (ref >60)
Glucose, Bld: 204 mg/dL — ABNORMAL HIGH (ref 70–99)
Potassium: 4.6 mmol/L (ref 3.5–5.1)
Sodium: 134 mmol/L — ABNORMAL LOW (ref 135–145)

## 2018-08-10 LAB — CBC
HCT: 33.2 % — ABNORMAL LOW (ref 36.0–46.0)
Hemoglobin: 11 g/dL — ABNORMAL LOW (ref 12.0–15.0)
MCH: 31.2 pg (ref 26.0–34.0)
MCHC: 33.1 g/dL (ref 30.0–36.0)
MCV: 94.1 fL (ref 80.0–100.0)
PLATELETS: 253 10*3/uL (ref 150–400)
RBC: 3.53 MIL/uL — ABNORMAL LOW (ref 3.87–5.11)
RDW: 13 % (ref 11.5–15.5)
WBC: 13.7 10*3/uL — ABNORMAL HIGH (ref 4.0–10.5)
nRBC: 0 % (ref 0.0–0.2)

## 2018-08-10 LAB — GLUCOSE, CAPILLARY
GLUCOSE-CAPILLARY: 200 mg/dL — AB (ref 70–99)
Glucose-Capillary: 206 mg/dL — ABNORMAL HIGH (ref 70–99)

## 2018-08-10 LAB — MAGNESIUM: Magnesium: 1.8 mg/dL (ref 1.7–2.4)

## 2018-08-10 MED ORDER — CLOPIDOGREL BISULFATE 75 MG PO TABS: 75.0000 mg | ORAL_TABLET | Freq: Every day | ORAL | 3 refills | Status: DC

## 2018-08-10 MED ORDER — OXYCODONE HCL 5 MG PO TABS: 5.0000 mg | ORAL_TABLET | Freq: Four times a day (QID) | ORAL | 0 refills | Status: DC | PRN

## 2018-08-10 MED ORDER — INSULIN ASPART PROT & ASPART (70-30 MIX) 100 UNIT/ML ~~LOC~~ SUSP: 25.0000 [IU] | Freq: Two times a day (BID) | SUBCUTANEOUS | Status: DC

## 2018-08-10 MED ORDER — MAGNESIUM SULFATE 2 GM/50ML IV SOLN
2.0000 g | Freq: Once | INTRAVENOUS | Status: AC
  Administered 2018-08-10: 2 g via INTRAVENOUS
  Filled 2018-08-10: qty 50

## 2018-08-10 NOTE — Discharge Summary (Signed)
[facility]    Discharge Summary   Patient ID:  Hannah Conway Dillingham MRN: [id number] DOB/AGE: June 07, 1930 85 y.o.  Admit date: 08/09/2018 Discharge date: 08/10/2018 Date of Surgery: 08/09/2018 Surgeon: Surgeon(s): Schnier, Latina CraverGregory G, MD  Admission Diagnosis: Hematoma of groin, initial encounter [S30.1XXA]  Discharge Diagnoses:  Hematoma of groin, initial encounter [S30.1XXA]  Secondary Diagnoses: Past Medical History:  Diagnosis Date  . Peripheral artery disease (HCC)    Procedure(s): LOWER EXTREMITY ANGIOGRAPHY  Discharged Condition: good  HPI:  The patient is a 85 year old female with a multiple medical issues including peripheral artery disease who presented with severe leg pain with mild rest pain symptoms and known atherosclerotic occlusive disease by both physical examination as well as noninvasive studies.  On 08/09/18, the patient underwent:  1. Introduction catheter into right lower extremity 3rd order catheter placement left femoral approach; introduction catheter into aorta right common femoral approach 2. Contrast injection right lower extremity for distal runoff   3. Percutaneous transluminal angioplasty and stent placement right superficial femoral artery and popliteal with life stents postdilated to 5 mm using Lutonix drug-eluting balloon 4. Percutaneous transluminal angioplasty and stent placement left external iliac with a 7 mm via bond in combination with a 8 mm life star stent.             5.  Percutaneous transluminal angioplasty and stent placement left common iliac artery with 8 mm lifestream stents             6.  Percutaneous transluminal angioplasty and stent placement right common iliac artery with an 8 mm lifestream stent.             7.  Star close closure right common femoral arteriotomy  The patient tolerated the procedure well and was transferred to the PACU then surgical floor for  observation overnight. The patients night of procedure was unremarkable.   Hospital Course:  Hannah Conway Wenzlick is a 85 y.o. female is S/P:  Procedure(s): LOWER EXTREMITY ANGIOGRAPHY  Extubated: POD # 0  Physical exam:  A&Ox3, NAD CV: RRR Pulm: CTA Bilaterally Abdomen: Soft, Non-tender, Non-distended, (+) Bowel Sounds Right Groin: PAD removed. Some ecchymosis noted. No drainage. No swelling. Incision: clean, dry and intact.  Left Groin: Dressing removed. No drainage. No swelling. Incision: clean, dry and intact.  Right Lower Extremity: Thigh soft, calf soft. Extremity warm to toes. Motor / sensory intact. Left Lower Extremity: Thigh soft, calf soft. Extremity warm to toes. Motor / sensory intact.  Post-op wounds clean, dry, intact or healing well  Pt. Ambulating, voiding and taking PO diet without difficulty.  Pt pain controlled with PO pain meds.  Labs as below  Complications: None  Consults: None  Significant Diagnostic Studies: CBC Lab Results  Component Value Date   WBC 13.7 (H) 08/10/2018   HGB 11.0 (L) 08/10/2018   HCT 33.2 (L) 08/10/2018   MCV 94.1 08/10/2018   PLT 253 08/10/2018   BMET    Component Value Date/Time   NA 134 (L) 08/10/2018 0418   NA 133 (L) 08/24/2013 0735   K 4.6 08/10/2018 0418   K 4.0 09/27/2014 1517   CL 99 08/10/2018 0418   CL 98 08/24/2013 0735   CO2 28 08/10/2018 0418   CO2 29 08/24/2013 0735   GLUCOSE 204 (H) 08/10/2018 0418   GLUCOSE 125 (H) 08/24/2013 0735   BUN 29 (H) 08/10/2018 0418   BUN 16 08/24/2013 0735   CREATININE 1.16 (H) 08/10/2018 [id number]  CREATININE 0.91 08/24/2013 0735   CALCIUM 9.3 08/10/2018 0418   CALCIUM 10.0 08/24/2013 0735   GFRNONAA 43 (L) 08/10/2018 0418   GFRNONAA 59 (L) 08/24/2013 0735   GFRAA 49 (L) 08/10/2018 0418   GFRAA >60 08/24/2013 0735   COAG Lab Results  Component Value Date   INR 1.0 07/25/2013   Disposition:  Discharge to :Home  Allergies as of 08/10/2018      Reactions    Atorvastatin Other (See Comments)   "Felt as if she would pass out"   Simvastatin Other (See Comments)   "Felt as if she would pass out"      Medication List    TAKE these medications   acetaminophen 500 MG tablet Commonly known as:  TYLENOL Take 1,000 mg by mouth See admin instructions. Take 2 tablets (1000 mg) by mouth (scheduled) at bedtime & every 6 hours as needed for pain.   aspirin EC 81 MG tablet Take 81 mg by mouth daily.   benazepril-hydrochlorthiazide 20-12.5 MG tablet Commonly known as:  LOTENSIN HCT Take 1 tablet by mouth daily.   buPROPion 150 MG 24 hr tablet Commonly known as:  WELLBUTRIN XL Take 150 mg by mouth daily.   CALCIUM CITRATE + D3 PO Take 2 tablets by mouth daily with lunch.   Cinnamon 500 MG Tabs Take 500 mg by mouth daily.   clopidogrel 75 MG tablet Commonly known as:  PLAVIX Take 1 tablet (75 mg total) by mouth daily with breakfast. Start taking on:  August 11, 2018   Fish Oil 1000 MG Caps Take 1,000 mg by mouth 2 (two) times daily.   multivitamin with minerals Tabs tablet Take 1 tablet by mouth daily. Complete Multivitamin   NOVOLOG MIX 70/30 FLEXPEN (70-30) 100 UNIT/ML FlexPen Generic drug:  insulin aspart protamine - aspart Inject 25 Units into the skin 2 (two) times daily.   oxyCODONE 5 MG immediate release tablet Commonly known as:  Oxy IR/ROXICODONE Take 1 tablet (5 mg total) by mouth every 6 (six) hours as needed for moderate pain or severe pain.   pravastatin 20 MG tablet Commonly known as:  PRAVACHOL Take 20 mg by mouth every evening.      Verbal and written Discharge instructions given to the patient. Wound care per Discharge AVS Follow-up Information    Schnier, Latina CraverGregory G, MD Follow up in 1 week(s).   Specialties:  Vascular Surgery, Cardiology, Radiology, Vascular Surgery Why:  First angiogram follow up. Patient to see Dr. Gilda CreaseSchnier. Will need aorto-iliac and bilateral lower extremity arterial duplex with  visit. Contact information: [address] [place] [address] [phone]          Signed: Tonette LedererKIMBERLY A Shilo Pauwels, PA-C  08/10/2018, 3:50 PM

## 2018-08-10 NOTE — Progress Notes (Signed)
Rozell SearingBetty M Hinostroza  A and O x 4. VSS. Pt tolerating diet well. No complaints of pain or nausea. IV removed intact, prescriptions given. Pt voiced understanding of discharge instructions with no further questions. Pt discharged via wheelchair.   Allergies as of 08/10/2018      Reactions   Atorvastatin Other (See Comments)   "Felt as if she would pass out"   Simvastatin Other (See Comments)   "Felt as if she would pass out"      Medication List    TAKE these medications   acetaminophen 500 MG tablet Commonly known as:  TYLENOL Take 1,000 mg by mouth See admin instructions. Take 2 tablets (1000 mg) by mouth (scheduled) at bedtime & every 6 hours as needed for pain.   aspirin EC 81 MG tablet Take 81 mg by mouth daily.   benazepril-hydrochlorthiazide 20-12.5 MG tablet Commonly known as:  LOTENSIN HCT Take 1 tablet by mouth daily.   buPROPion 150 MG 24 hr tablet Commonly known as:  WELLBUTRIN XL Take 150 mg by mouth daily.   CALCIUM CITRATE + D3 PO Take 2 tablets by mouth daily with lunch.   Cinnamon 500 MG Tabs Take 500 mg by mouth daily.   clopidogrel 75 MG tablet Commonly known as:  PLAVIX Take 1 tablet (75 mg total) by mouth daily with breakfast. Start taking on:  August 11, 2018   Fish Oil 1000 MG Caps Take 1,000 mg by mouth 2 (two) times daily.   multivitamin with minerals Tabs tablet Take 1 tablet by mouth daily. Complete Multivitamin   NOVOLOG MIX 70/30 FLEXPEN (70-30) 100 UNIT/ML FlexPen Generic drug:  insulin aspart protamine - aspart Inject 25 Units into the skin 2 (two) times daily.   oxyCODONE 5 MG immediate release tablet Commonly known as:  Oxy IR/ROXICODONE Take 1 tablet (5 mg total) by mouth every 6 (six) hours as needed for moderate pain or severe pain.   pravastatin 20 MG tablet Commonly known as:  PRAVACHOL Take 20 mg by mouth every evening.       Vitals:   08/10/18 0840 08/10/18 1304  BP: (!) 164/80 (!) 165/72  Pulse: 89 83  Resp:  18  Temp:   (!) 97.4 F (36.3 C)  SpO2:  94%    Suzzanne CloudJuan G Rodriguez Ornelas

## 2018-08-10 NOTE — Progress Notes (Signed)
Inpatient Diabetes Program Recommendations  AACE/ADA: New Consensus Statement on Inpatient Glycemic Control   Target Ranges:  Prepandial:   less than 140 mg/dL      Peak postprandial:   less than 180 mg/dL (1-2 hours)      Critically ill patients:  140 - 180 mg/dL   Results for OLETHA, EMSHOFF (MRN [id number]) as of 08/10/2018 10:45  Ref. Range 08/09/2018 10:21 08/09/2018 15:09 08/09/2018 21:05 08/10/2018 08:28  Glucose-Capillary Latest Ref Range: 70 - 99 mg/dL 034 (H) 035 (H) 248 (H) 200 (H)   Review of Glycemic Control  Diabetes history: DM2 Outpatient Diabetes medications: 70/30 25 units BID with meals Current orders for Inpatient glycemic control: Novolog 0-15 units TID with meals  Inpatient Diabetes Program Recommendations:  Insulin-Correction: Please consider ordering Novolog 0-5 units QHS for bedtime correction. Insulin - Basal: If glucose is consistently greater than 180 mg/dl with Novolog correction, please consider ordering Levemir 7 units Q24H (based on 72.8 kg x 0.1 units).  Thanks, Orlando Penner, RN, MSN, CDE Diabetes Coordinator Inpatient Diabetes Program [phone] (Team Pager from 8am to 5pm)

## 2018-08-10 NOTE — Care Management Obs Status (Signed)
MEDICARE OBSERVATION STATUS NOTIFICATION   Patient Details  Name: Hannah Conway MRN: [id number] Date of Birth: 1929/11/02   Medicare Observation Status Notification Given:  Yes    Kenji Mapel A Armonte Tortorella, RN 08/10/2018, 3:56 PM

## 2018-08-10 NOTE — Telephone Encounter (Signed)
Dominga Ferry, CMA  Stegmayer, Ranae Plumber, PA-C        Patient will be canceled. Thanks   Previous Messages    ----- Message -----  From: Tonette Lederer, PA-C  Sent: 08/10/2018  3:49 PM EST  To: Dominga Ferry, CMA  Subject: Cancel                      Can we please cancel this patients 08/16/18 angiogram with Dr. Gilda Crease. It is not needed.   Thanks.  Selena Batten

## 2018-08-10 NOTE — Discharge Instructions (Signed)
You may shower. Please keep groins clean and dry.

## 2018-08-16 ENCOUNTER — Encounter: Admission: RE | Payer: Self-pay | Source: Home / Self Care

## 2018-08-16 ENCOUNTER — Ambulatory Visit: Admission: RE | Admit: 2018-08-16 | Payer: Medicare Other | Source: Home / Self Care | Admitting: Vascular Surgery

## 2018-08-16 SURGERY — LOWER EXTREMITY ANGIOGRAPHY
Anesthesia: Moderate Sedation | Site: Leg Lower | Laterality: Left

## 2018-08-22 ENCOUNTER — Other Ambulatory Visit (INDEPENDENT_AMBULATORY_CARE_PROVIDER_SITE_OTHER): Payer: Self-pay | Admitting: Vascular Surgery

## 2018-08-22 DIAGNOSIS — Z9862 Peripheral vascular angioplasty status: Secondary | ICD-10-CM

## 2018-08-23 ENCOUNTER — Other Ambulatory Visit (INDEPENDENT_AMBULATORY_CARE_PROVIDER_SITE_OTHER): Payer: Self-pay | Admitting: Vascular Surgery

## 2018-08-23 DIAGNOSIS — Z9862 Peripheral vascular angioplasty status: Secondary | ICD-10-CM

## 2018-08-24 ENCOUNTER — Encounter (INDEPENDENT_AMBULATORY_CARE_PROVIDER_SITE_OTHER): Payer: Self-pay | Admitting: Vascular Surgery

## 2018-08-24 ENCOUNTER — Ambulatory Visit (INDEPENDENT_AMBULATORY_CARE_PROVIDER_SITE_OTHER): Payer: Medicare Other

## 2018-08-24 ENCOUNTER — Ambulatory Visit (INDEPENDENT_AMBULATORY_CARE_PROVIDER_SITE_OTHER): Payer: Medicare Other | Admitting: Vascular Surgery

## 2018-08-24 VITALS — BP 180/66 | HR 85 | Resp 16 | Wt 157.6 lb

## 2018-08-24 DIAGNOSIS — Z95820 Peripheral vascular angioplasty status with implants and grafts: Secondary | ICD-10-CM | POA: Diagnosis not present

## 2018-08-24 DIAGNOSIS — I700 Atherosclerosis of aorta: Secondary | ICD-10-CM

## 2018-08-24 DIAGNOSIS — F17210 Nicotine dependence, cigarettes, uncomplicated: Secondary | ICD-10-CM | POA: Diagnosis not present

## 2018-08-24 DIAGNOSIS — E1169 Type 2 diabetes mellitus with other specified complication: Secondary | ICD-10-CM | POA: Diagnosis not present

## 2018-08-24 DIAGNOSIS — I739 Peripheral vascular disease, unspecified: Secondary | ICD-10-CM | POA: Diagnosis not present

## 2018-08-24 DIAGNOSIS — Z9862 Peripheral vascular angioplasty status: Secondary | ICD-10-CM

## 2018-08-24 DIAGNOSIS — E785 Hyperlipidemia, unspecified: Secondary | ICD-10-CM | POA: Diagnosis not present

## 2018-08-24 DIAGNOSIS — Z7901 Long term (current) use of anticoagulants: Secondary | ICD-10-CM

## 2018-08-24 DIAGNOSIS — I70229 Atherosclerosis of native arteries of extremities with rest pain, unspecified extremity: Secondary | ICD-10-CM | POA: Insufficient documentation

## 2018-08-24 NOTE — Progress Notes (Signed)
Subjective:    Patient ID: Hannah Conway, female    DOB: 1930-01-29, 85 y.o.   MRN: [id number] Chief Complaint  Patient presents with  . Follow-up    [facility] 2week aorta iliac   Patient presents for her first post procedure follow-up.  On 08/10/18, the patient is status post: 1. Introduction catheter into right lower extremity 3rd order catheter placement left femoral approach; introduction catheter into aorta right common femoral approach 2. Contrast injection right lower extremity for distal runoff   3. Percutaneous transluminal angioplasty and stent placement right superficial femoral artery and popliteal with life stents postdilated to 5 mm using Lutonix drug-eluting balloon 4. Percutaneous transluminal angioplasty and stent placement left external iliac with a 7 mm via bond in combination with a 8 mm life star stent.             5.  Percutaneous transluminal angioplasty and stent placement left common iliac artery with 8 mm lifestream stents             6.  Percutaneous transluminal angioplasty and stent placement right common iliac artery with an 8 mm lifestream stent.             7.  Star close closure right common femoral arteriotomy  The patient is seen with her daughter.  The patient notes market improvement to the discomfort she was experiencing prior to her recent intervention to the bilateral legs.  Patient notes she is able to walk better.  Denies any claudication-like symptoms, rest pain or ulcer formation to the bilateral legs.  The patient underwent a bilateral ABI which was notable for: Right: 0.95, biphasic tibials with normal great toe waveforms Left: 0.59, monophasic anterior tibial, biphasic posterior tibial, normal great toe waveforms A left lower extremity arterial duplex was completed showing a greater than 75% distal SFA stenosis with a ratio of 2.9 compared to the mid SFA.  The patient denies any fever, nausea  vomiting.  Review of Systems  Constitutional: Negative.   HENT: Negative.   Eyes: Negative.   Respiratory: Negative.   Cardiovascular: Negative.   Gastrointestinal: Negative.   Endocrine: Negative.   Genitourinary: Negative.   Musculoskeletal: Negative.   Skin: Negative.   Allergic/Immunologic: Negative.   Neurological: Negative.   Hematological: Negative.   Psychiatric/Behavioral: Negative.       Objective:   Physical Exam Vitals signs reviewed.  Constitutional:      Appearance: Normal appearance. She is obese.  HENT:     Head: Normocephalic and atraumatic.     Right Ear: External ear normal.     Left Ear: External ear normal.     Nose: Nose normal.     Mouth/Throat:     Mouth: Mucous membranes are moist.     Pharynx: Oropharynx is clear.  Eyes:     Extraocular Movements: Extraocular movements intact.     Conjunctiva/sclera: Conjunctivae normal.     Pupils: Pupils are equal, round, and reactive to light.  Neck:     Musculoskeletal: Normal range of motion.  Cardiovascular:     Rate and Rhythm: Normal rate and regular rhythm.     Comments: Hard to palpate pedal pulses however the bilateral feet are warm with a good capillary refill. Pulmonary:     Effort: Pulmonary effort is normal.     Breath sounds: Normal breath sounds.  Musculoskeletal: Normal range of motion.        General: No swelling.  Skin:    General: Skin is  warm and dry.  Neurological:     General: No focal deficit present.     Mental Status: She is alert and oriented to person, place, and time. Mental status is at baseline.  Psychiatric:        Mood and Affect: Mood normal.        Behavior: Behavior normal.        Thought Content: Thought content normal.        Judgment: Judgment normal.    BP (!) 180/66 (BP Location: Right Arm)   Pulse 85   Resp 16   Wt 157 lb 9.6 oz (71.5 kg)   BMI 27.92 kg/m   Past Medical History:  Diagnosis Date  . Peripheral artery disease (HCC)    Social History    Socioeconomic History  . Marital status: Married    Spouse name: Burman Riis   . Number of children: 5  . Years of education: Not on file  . 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  Social Needs  . Financial resource strain: Not very hard  . Food insecurity:    Worry: Never true    Inability: Never true  . Transportation needs:    Medical: No    Non-medical: No  Tobacco Use  . Smoking status: Current Every Day Smoker    Packs/day: 1.00    Years: 30.00    Pack years: 30.00    Types: Cigarettes  . Smokeless tobacco: Never Used  Substance and Sexual Activity  . Alcohol use: Never    Frequency: Never  . Drug use: Not Currently  . Sexual activity: Not on file  Lifestyle  . Physical activity:    Days per week: 0 days    Minutes per session: 0 min  . Stress: Not on file  Relationships  . Social connections:    Talks on phone: More than three times a week    Gets together: More than three times a week    Attends religious service: 1 to 4 times per year    Active member of club or organization: Not on file    Attends meetings of clubs or organizations: Not on file    Relationship status: Married  . Intimate partner violence:    Fear of current or ex partner: No    Emotionally abused: No    Physically abused: No    Forced sexual activity: No  Other Topics Concern  . Not on file  Social History Narrative  . Not on file   Past Surgical History:  Procedure Laterality Date  . ABDOMINAL HYSTERECTOMY     85 y.o. at time  . CHOLECYSTECTOMY  1965  . JOINT REPLACEMENT  2016   Bilat. Hip Replacements   . LOWER EXTREMITY ANGIOGRAPHY Right 08/09/2018   Procedure: LOWER EXTREMITY ANGIOGRAPHY;  Surgeon: Renford Dills, MD;  Location: [facility] INVASIVE CV LAB;  Service: Cardiovascular;  Laterality: Right;   Family History  Problem Relation Age of Onset  . Congestive Heart Failure  Mother   . Diabetes Father   . Diabetes Sister    Allergies  Allergen Reactions  . Atorvastatin Other (See Comments)    "Felt as if she would pass out"  . Simvastatin Other (See Comments)    "Felt as if she would pass out"       Assessment & Plan:  Patient presents for her first post procedure follow-up.  On 08/10/18, the patient is status post: 1. Introduction catheter into right lower extremity 3rd order catheter placement left femoral approach; introduction catheter into aorta right common femoral approach 2. Contrast injection right lower extremity for distal runoff   3. Percutaneous transluminal angioplasty and stent placement right superficial femoral artery and popliteal with life stents postdilated to 5 mm using Lutonix drug-eluting balloon 4. Percutaneous transluminal angioplasty and stent placement left external iliac with a 7 mm via bond in combination with a 8 mm life star stent.             5.  Percutaneous transluminal angioplasty and stent placement left common iliac artery with 8 mm lifestream stents             6.  Percutaneous transluminal angioplasty and stent placement right common iliac artery with an 8 mm lifestream stent.             7.  Star close closure right common femoral arteriotomy  The patient is seen with her daughter.  The patient notes market improvement to the discomfort she was experiencing prior to her recent intervention to the bilateral legs.  Patient notes she is able to walk better.  Denies any claudication-like symptoms, rest pain or ulcer formation to the bilateral legs.  The patient underwent a bilateral ABI which was notable for: Right: 0.95, biphasic tibials with normal great toe waveforms Left: 0.59, monophasic anterior tibial, biphasic posterior tibial, normal great toe waveforms A left lower extremity arterial duplex was completed showing a greater than 75% distal SFA stenosis with a ratio of  2.9 compared to the mid SFA.  The patient denies any fever, nausea vomiting.  1. PAD (peripheral artery disease) (HCC) - Stable Patient with improved arterial patency to the bilateral legs Right lower extremity without any significant stenosis Left lower extremity with significant stenosis at the distal SFA. Recommended re-intervention on the left lower extremity in an effort to assess that area and if appropriate intervene. I long discussion with the patient and her daughter who was present in regard to the pathophysiology of peripheral artery disease and it being progressive. At this time, the patient is not giving consent for intervention I will bring the patient back in 3 months and have her undergo bilateral ABI and left lower extremity arterial duplex to follow this area I have discussed with the patient at length the risk factors for and pathogenesis of atherosclerotic disease and encouraged a healthy diet, regular exercise regimen and blood pressure / glucose control.  The patient was encouraged to call the office in the interim if he experiences any claudication like symptoms, rest pain or ulcers to his feet / toes.  - VAS [place] ABI WITH/WO TBI; Future - VAS [place] LOWER EXTREMITY ARTERIAL DUPLEX; Future  2. Aortic atherosclerosis (HCC) - Stable As above  3. Hyperlipidemia associated with type 2 diabetes mellitus (HCC) -Stable On ASA and Plavix.  On Pravastatin. Encouraged good control as its slows the progression of atherosclerotic disease  Current Outpatient Medications on File Prior to Visit  Medication Sig Dispense Refill  . acetaminophen (TYLENOL) 500 MG tablet Take 1,000 mg by mouth See admin instructions. Take 2 tablets (1000 mg) by mouth (scheduled) at bedtime & every 6 hours as needed for pain.    Marland Kitchen aspirin EC 81 MG tablet Take 81 mg by mouth daily.     . benazepril-hydrochlorthiazide (LOTENSIN HCT) 20-12.5 MG tablet Take 1 tablet by mouth daily.    Marland Kitchen buPROPion [facility]  XL)  150 MG 24 hr tablet Take 150 mg by mouth daily.    . Calcium Citrate-Vitamin D (CALCIUM CITRATE + D3 PO) Take 2 tablets by mouth daily with lunch.    . Cinnamon 500 MG TABS Take 500 mg by mouth daily.    . clopidogrel (PLAVIX) 75 MG tablet Take 1 tablet (75 mg total) by mouth daily with breakfast. 90 tablet 3  . Multiple Vitamin (MULTIVITAMIN WITH MINERALS) TABS tablet Take 1 tablet by mouth daily. Complete Multivitamin    . NOVOLOG MIX 70/30 FLEXPEN (70-30) 100 UNIT/ML FlexPen Inject 25 Units into the skin 2 (two) times daily.  1  . Omega-3 Fatty Acids (FISH OIL) 1000 MG CAPS Take 1,000 mg by mouth 2 (two) times daily.    Marland Kitchen oxyCODONE (OXY IR/ROXICODONE) 5 MG immediate release tablet Take 1 tablet (5 mg total) by mouth every 6 (six) hours as needed for moderate pain or severe pain. 20 tablet 0  . pravastatin (PRAVACHOL) 20 MG tablet Take 20 mg by mouth every evening.      No current facility-administered medications on file prior to visit.    There are no Patient Instructions on file for this visit. No follow-ups on file.  Adoria Kawamoto A Iain Sawchuk, PA-C

## 2018-11-24 ENCOUNTER — Ambulatory Visit (INDEPENDENT_AMBULATORY_CARE_PROVIDER_SITE_OTHER): Payer: Medicare Other | Admitting: Vascular Surgery

## 2018-11-24 ENCOUNTER — Encounter (INDEPENDENT_AMBULATORY_CARE_PROVIDER_SITE_OTHER): Payer: Medicare Other

## 2018-12-26 DIAGNOSIS — E559 Vitamin D deficiency, unspecified: Secondary | ICD-10-CM | POA: Insufficient documentation

## 2019-01-05 ENCOUNTER — Ambulatory Visit (INDEPENDENT_AMBULATORY_CARE_PROVIDER_SITE_OTHER): Payer: Medicare Other

## 2019-01-05 ENCOUNTER — Ambulatory Visit (INDEPENDENT_AMBULATORY_CARE_PROVIDER_SITE_OTHER): Payer: Medicare Other | Admitting: Vascular Surgery

## 2019-01-05 ENCOUNTER — Encounter (INDEPENDENT_AMBULATORY_CARE_PROVIDER_SITE_OTHER): Payer: Self-pay | Admitting: Vascular Surgery

## 2019-01-05 ENCOUNTER — Other Ambulatory Visit: Payer: Self-pay

## 2019-01-05 ENCOUNTER — Encounter (INDEPENDENT_AMBULATORY_CARE_PROVIDER_SITE_OTHER): Payer: Self-pay

## 2019-01-05 VITALS — BP 164/49 | HR 55 | Resp 14 | Ht 62.0 in | Wt 149.0 lb

## 2019-01-05 DIAGNOSIS — Z95820 Peripheral vascular angioplasty status with implants and grafts: Secondary | ICD-10-CM

## 2019-01-05 DIAGNOSIS — I70202 Unspecified atherosclerosis of native arteries of extremities, left leg: Secondary | ICD-10-CM | POA: Diagnosis not present

## 2019-01-05 DIAGNOSIS — Z79899 Other long term (current) drug therapy: Secondary | ICD-10-CM

## 2019-01-05 DIAGNOSIS — E1159 Type 2 diabetes mellitus with other circulatory complications: Secondary | ICD-10-CM

## 2019-01-05 DIAGNOSIS — E1122 Type 2 diabetes mellitus with diabetic chronic kidney disease: Secondary | ICD-10-CM | POA: Diagnosis not present

## 2019-01-05 DIAGNOSIS — I152 Hypertension secondary to endocrine disorders: Secondary | ICD-10-CM

## 2019-01-05 DIAGNOSIS — I129 Hypertensive chronic kidney disease with stage 1 through stage 4 chronic kidney disease, or unspecified chronic kidney disease: Secondary | ICD-10-CM

## 2019-01-05 DIAGNOSIS — I70221 Atherosclerosis of native arteries of extremities with rest pain, right leg: Secondary | ICD-10-CM | POA: Diagnosis not present

## 2019-01-05 DIAGNOSIS — I739 Peripheral vascular disease, unspecified: Secondary | ICD-10-CM

## 2019-01-05 DIAGNOSIS — E785 Hyperlipidemia, unspecified: Secondary | ICD-10-CM

## 2019-01-05 DIAGNOSIS — K219 Gastro-esophageal reflux disease without esophagitis: Secondary | ICD-10-CM

## 2019-01-05 DIAGNOSIS — F17210 Nicotine dependence, cigarettes, uncomplicated: Secondary | ICD-10-CM

## 2019-01-05 DIAGNOSIS — E1169 Type 2 diabetes mellitus with other specified complication: Secondary | ICD-10-CM

## 2019-01-05 DIAGNOSIS — N183 Chronic kidney disease, stage 3 unspecified: Secondary | ICD-10-CM

## 2019-01-05 DIAGNOSIS — E1151 Type 2 diabetes mellitus with diabetic peripheral angiopathy without gangrene: Secondary | ICD-10-CM | POA: Diagnosis not present

## 2019-01-05 DIAGNOSIS — Z794 Long term (current) use of insulin: Secondary | ICD-10-CM

## 2019-01-05 NOTE — Progress Notes (Signed)
MRN : [id number]  Hannah Conway is a 85 y.o. (1929/10/28) female who presents with chief complaint of  Chief Complaint  Patient presents with  . Follow-up    ultrasound  .  History of Present Illness:   The patient returns to the office for followup and review status post angiogram with intervention on 08/09/2018.   Procedure(s) Performed: 1. Introduction catheter into right lower extremity 3rd order catheter placement left femoral approach; introduction catheter into aorta right common femoral approach 2. Contrast injection right lower extremity for distal runoff   3. Percutaneous transluminal angioplasty and stent placement right superficial femoral artery and popliteal with life stents postdilated to 5 mm using Lutonix drug-eluting balloon 4. Percutaneous transluminal angioplasty and stent placement left external iliac with a 7 mm via bond in combination with a 8 mm life star stent.             5.  Percutaneous transluminal angioplasty and stent placement left common iliac artery with 8 mm lifestream stents             6.  Percutaneous transluminal angioplasty and stent placement right common iliac artery with an 8 mm lifestream stent.             7.  Star close closure right common femoral arteriotomy  The patient notes improvement in the lower extremity symptoms. No interval shortening of the patient's claudication distance or rest pain symptoms. Previous wounds have now healed.  No new ulcers or wounds have occurred since the last visit.  There have been no significant changes to the patient's overall health care.  The patient denies amaurosis fugax or recent TIA symptoms. There are no recent neurological changes noted. The patient denies history of DVT, PE or superficial thrombophlebitis. The patient denies recent episodes of angina or shortness of breath.     Current Meds  Medication Sig  . acetaminophen (TYLENOL) 500 MG  tablet Take 1,000 mg by mouth See admin instructions. Take 2 tablets (1000 mg) by mouth (scheduled) at bedtime & every 6 hours as needed for pain.  Marland Kitchen. aspirin EC 81 MG tablet Take 81 mg by mouth daily.   . benazepril-hydrochlorthiazide (LOTENSIN HCT) 20-12.5 MG tablet Take 1 tablet by mouth daily.  Marland Kitchen. buPROPion (WELLBUTRIN XL) 150 MG 24 hr tablet Take 150 mg by mouth daily.  . Calcium Citrate-Vitamin D (CALCIUM CITRATE + D3 PO) Take 2 tablets by mouth daily with lunch.  . Cinnamon 500 MG TABS Take 500 mg by mouth daily.  . clopidogrel (PLAVIX) 75 MG tablet Take 1 tablet (75 mg total) by mouth daily with breakfast.  . Multiple Vitamin (MULTIVITAMIN WITH MINERALS) TABS tablet Take 1 tablet by mouth daily. Complete Multivitamin  . NOVOLOG MIX 70/30 FLEXPEN (70-30) 100 UNIT/ML FlexPen Inject 25 Units into the skin 2 (two) times daily.  . Omega-3 Fatty Acids (FISH OIL) 1000 MG CAPS Take 1,000 mg by mouth 2 (two) times daily.  . pravastatin (PRAVACHOL) 20 MG tablet Take 20 mg by mouth every evening.     Past Medical History:  Diagnosis Date  . Diabetes mellitus without complication (HCC)   . Hypertension   . Peripheral artery disease [facility](HCC)     Past Surgical History:  Procedure Laterality Date  . ABDOMINAL HYSTERECTOMY     85 y.o. at time  . CHOLECYSTECTOMY  1965  . JOINT REPLACEMENT  2016   Bilat. Hip Replacements   . LOWER EXTREMITY ANGIOGRAPHY Right 08/09/2018   Procedure:  LOWER EXTREMITY ANGIOGRAPHY;  Surgeon: Renford DillsSchnier, Gregory G, MD;  Location: [facility] INVASIVE CV LAB;  Service: Cardiovascular;  Laterality: Right;    Social History Social History   Tobacco Use  . Smoking status: Current Every Day Smoker    Packs/day: 1.00    Years: 30.00    Pack years: 30.00    Types: Cigarettes  . Smokeless tobacco: Never Used  Substance Use Topics  . Alcohol use: Never    Frequency: Never  . Drug use: Not Currently    Family History Family History  Problem Relation Age of Onset  .  Congestive Heart Failure Mother   . Diabetes Father   . Diabetes Sister     Allergies  Allergen Reactions  . Atorvastatin Other (See Comments)    "Felt as if she would pass out"  . Simvastatin Other (See Comments)    "Felt as if she would pass out"      REVIEW OF SYSTEMS (Negative unless checked)  Constitutional: [] Weight loss  [] Fever  [] Chills Cardiac: [] Chest pain   [] Chest pressure   [] Palpitations   [] Shortness of breath when laying flat   [] Shortness of breath with exertion. Vascular:  [x] Pain in legs with walking   [x] Pain in legs at rest  [] History of DVT   [] Phlebitis   [] Swelling in legs   [] Varicose veins   [] Non-healing ulcers Pulmonary:   [] Uses home oxygen   [] Productive cough   [] Hemoptysis   [] Wheeze  [] COPD   [] Asthma Neurologic:  [] Dizziness   [] Seizures   [] History of stroke   [] History of TIA  [] Aphasia   [] Vissual changes   [] Weakness or numbness in arm   [] Weakness or numbness in leg Musculoskeletal:   [] Joint swelling   [] Joint pain   [] Low back pain Hematologic:  [] Easy bruising  [] Easy bleeding   [] Hypercoagulable state   [] Anemic Gastrointestinal:  [] Diarrhea   [] Vomiting  [x] Gastroesophageal reflux/heartburn   [] Difficulty swallowing. Genitourinary:  [x] Chronic kidney disease   [] Difficult urination  [] Frequent urination   [] Blood in urine Skin:  [] Rashes   [] Ulcers  Psychological:  [] History of anxiety   []  History of major depression.  Physical Examination  Vitals:   01/05/19 1442  BP: (!) 164/49  Pulse: (!) 55  Resp: 14  Weight: 149 lb (67.6 kg)  Height: 5\' 2"  (1.575 m)   Body mass index is 27.25 kg/m. Gen: WD/WN, NAD Head: [place]/AT, No temporalis wasting.  Ear/Nose/Throat: Hearing grossly intact, nares w/o erythema or drainage Eyes: PER, EOMI, sclera nonicteric.  Neck: Supple, no large masses.   Pulmonary:  Good air movement, no audible wheezing bilaterally, no use of accessory muscles.  Cardiac: RRR, no JVD Vascular: Right leg is cool to  touch with greater than 3-second cap refill Vessel Right Left  Radial Palpable Palpable  PT Not Palpable Trace Palpable  DP Not Palpable Trace Palpable  Gastrointestinal: Non-distended. No guarding/no peritoneal signs.  Musculoskeletal: M/S 5/5 throughout.  No deformity or atrophy.  Neurologic: CN 2-12 intact. Symmetrical.  Speech is fluent. Motor exam as listed above. Psychiatric: Judgment intact, Mood & affect appropriate for pt's clinical situation. Dermatologic: No rashes or ulcers noted.  No changes consistent with cellulitis. Lymph : No lichenification or skin changes of chronic lymphedema.  CBC Lab Results  Component Value Date   WBC 13.7 (H) 08/10/2018   HGB 11.0 (L) 08/10/2018   HCT 33.2 (L) 08/10/2018   MCV 94.1 08/10/2018   PLT 253 08/10/2018    BMET  Component Value Date/Time   NA 134 (L) 08/10/2018 0418   NA 133 (L) 08/24/2013 0735   K 4.6 08/10/2018 0418   K 4.0 09/27/2014 1517   CL 99 08/10/2018 0418   CL 98 08/24/2013 0735   CO2 28 08/10/2018 0418   CO2 29 08/24/2013 0735   GLUCOSE 204 (H) 08/10/2018 0418   GLUCOSE 125 (H) 08/24/2013 0735   BUN 29 (H) 08/10/2018 0418   BUN 16 08/24/2013 0735   CREATININE 1.16 (H) 08/10/2018 0418   CREATININE 0.91 08/24/2013 0735   CALCIUM 9.3 08/10/2018 0418   CALCIUM 10.0 08/24/2013 0735   GFRNONAA 43 (L) 08/10/2018 0418   GFRNONAA 59 (L) 08/24/2013 0735   GFRAA 49 (L) 08/10/2018 0418   GFRAA >60 08/24/2013 0735   CrCl cannot be calculated (Patient's most recent lab result is older than the maximum 21 days allowed.).  COAG Lab Results  Component Value Date   INR 1.0 07/25/2013    Radiology No results found.   Assessment/Plan 1. Atherosclerosis of native artery of right lower extremity with rest pain (HCC) Recommend:  The patient has evidence of severe atherosclerotic changes of both lower extremities with rest pain that is associated with preulcerative changes and impending tissue loss of the foot.   This represents a limb threatening ischemia and places the patient at the risk for limb loss.  Patient should undergo angiography of the lower extremities with the hope for intervention for limb salvage.  The risks and benefits as well as the alternative therapies was discussed in detail with the patient.  All questions were answered.  Patient agrees to proceed with angiography.  The patient will follow up with me in the office after the procedure.       2. Hypertension associated with type 2 diabetes mellitus ([place]) Continue antihypertensive medications as already ordered, these medications have been reviewed and there are no changes at this time.   3. Type 2 diabetes mellitus with stage 3 chronic kidney disease, with long-term current use of insulin (HCC) Continue hypoglycemic medications as already ordered, these medications have been reviewed and there are no changes at this time.  Hgb A1C to be monitored as already arranged by primary service   4. Hyperlipidemia associated with type 2 diabetes mellitus ([place]) Continue statin as ordered and reviewed, no changes at this time   5. Gastroesophageal reflux disease, esophagitis presence not specified Continue PPI as already ordered, this medication has been reviewed and there are no changes at this time.  Avoidence of caffeine and alcohol  Moderate elevation of the head of the bed    Hortencia Pilar, MD  01/05/2019 2:59 PM

## 2019-01-06 ENCOUNTER — Telehealth (INDEPENDENT_AMBULATORY_CARE_PROVIDER_SITE_OTHER): Payer: Self-pay

## 2019-01-06 ENCOUNTER — Encounter (INDEPENDENT_AMBULATORY_CARE_PROVIDER_SITE_OTHER): Payer: Self-pay | Admitting: Vascular Surgery

## 2019-01-06 ENCOUNTER — Encounter (INDEPENDENT_AMBULATORY_CARE_PROVIDER_SITE_OTHER): Payer: Self-pay

## 2019-01-06 NOTE — Telephone Encounter (Signed)
Spoke with the patient and discussed her upcoming leg angio with Dr. Delana Meyer scheduled for 01/13/2019 with a 8:00 am arrival time. Patient will have Covid testing on 01/10/2019 between 12:30-2:30 pm at the [facility]. Zero visitor policy was discussed with the patient as well. All instructions will be mailed out to the patient.

## 2019-01-10 ENCOUNTER — Other Ambulatory Visit: Payer: Self-pay

## 2019-01-10 ENCOUNTER — Other Ambulatory Visit (INDEPENDENT_AMBULATORY_CARE_PROVIDER_SITE_OTHER): Payer: Self-pay | Admitting: Nurse Practitioner

## 2019-01-10 ENCOUNTER — Other Ambulatory Visit
Admission: RE | Admit: 2019-01-10 | Discharge: 2019-01-10 | Disposition: A | Discharge status: Home / Self Care | Payer: Medicare Other | Source: Ambulatory Visit | Attending: Vascular Surgery | Admitting: Vascular Surgery

## 2019-01-10 DIAGNOSIS — Z1159 Encounter for screening for other viral diseases: Secondary | ICD-10-CM | POA: Insufficient documentation

## 2019-01-11 LAB — SARS CORONAVIRUS 2 (TAT 6-24 HRS): SARS Coronavirus 2: NEGATIVE

## 2019-01-12 MED ORDER — CEFAZOLIN SODIUM-DEXTROSE 2-4 GM/100ML-% IV SOLN
2.0000 g | Freq: Once | INTRAVENOUS | Status: AC
  Administered 2019-01-13: 2 g via INTRAVENOUS

## 2019-01-13 ENCOUNTER — Other Ambulatory Visit (INDEPENDENT_AMBULATORY_CARE_PROVIDER_SITE_OTHER): Payer: Self-pay | Admitting: Nurse Practitioner

## 2019-01-13 ENCOUNTER — Encounter
Admission: RE | Disposition: A | Discharge status: Home / Self Care | Payer: Self-pay | Source: Ambulatory Visit | Attending: Vascular Surgery

## 2019-01-13 ENCOUNTER — Ambulatory Visit
Admission: RE | Admit: 2019-01-13 | Discharge: 2019-01-13 | Disposition: A | Discharge status: Home / Self Care | Payer: Medicare Other | Source: Ambulatory Visit | Attending: Vascular Surgery | Admitting: Vascular Surgery

## 2019-01-13 ENCOUNTER — Other Ambulatory Visit: Payer: Self-pay

## 2019-01-13 DIAGNOSIS — I70221 Atherosclerosis of native arteries of extremities with rest pain, right leg: Secondary | ICD-10-CM | POA: Insufficient documentation

## 2019-01-13 DIAGNOSIS — K219 Gastro-esophageal reflux disease without esophagitis: Secondary | ICD-10-CM | POA: Diagnosis not present

## 2019-01-13 DIAGNOSIS — Z96643 Presence of artificial hip joint, bilateral: Secondary | ICD-10-CM | POA: Diagnosis not present

## 2019-01-13 DIAGNOSIS — Z794 Long term (current) use of insulin: Secondary | ICD-10-CM | POA: Diagnosis not present

## 2019-01-13 DIAGNOSIS — Z90710 Acquired absence of both cervix and uterus: Secondary | ICD-10-CM | POA: Diagnosis not present

## 2019-01-13 DIAGNOSIS — F17210 Nicotine dependence, cigarettes, uncomplicated: Secondary | ICD-10-CM | POA: Insufficient documentation

## 2019-01-13 DIAGNOSIS — I70219 Atherosclerosis of native arteries of extremities with intermittent claudication, unspecified extremity: Secondary | ICD-10-CM

## 2019-01-13 DIAGNOSIS — Z888 Allergy status to other drugs, medicaments and biological substances status: Secondary | ICD-10-CM | POA: Insufficient documentation

## 2019-01-13 DIAGNOSIS — N183 Chronic kidney disease, stage 3 (moderate): Secondary | ICD-10-CM | POA: Diagnosis not present

## 2019-01-13 DIAGNOSIS — Y831 Surgical operation with implant of artificial internal device as the cause of abnormal reaction of the patient, or of later complication, without mention of misadventure at the time of the procedure: Secondary | ICD-10-CM | POA: Insufficient documentation

## 2019-01-13 DIAGNOSIS — Z8249 Family history of ischemic heart disease and other diseases of the circulatory system: Secondary | ICD-10-CM | POA: Diagnosis not present

## 2019-01-13 DIAGNOSIS — Z833 Family history of diabetes mellitus: Secondary | ICD-10-CM | POA: Diagnosis not present

## 2019-01-13 DIAGNOSIS — I129 Hypertensive chronic kidney disease with stage 1 through stage 4 chronic kidney disease, or unspecified chronic kidney disease: Secondary | ICD-10-CM | POA: Insufficient documentation

## 2019-01-13 DIAGNOSIS — E785 Hyperlipidemia, unspecified: Secondary | ICD-10-CM | POA: Diagnosis not present

## 2019-01-13 DIAGNOSIS — T82868A Thrombosis of vascular prosthetic devices, implants and grafts, initial encounter: Secondary | ICD-10-CM | POA: Insufficient documentation

## 2019-01-13 DIAGNOSIS — E1169 Type 2 diabetes mellitus with other specified complication: Secondary | ICD-10-CM | POA: Insufficient documentation

## 2019-01-13 DIAGNOSIS — I70202 Unspecified atherosclerosis of native arteries of extremities, left leg: Secondary | ICD-10-CM | POA: Diagnosis not present

## 2019-01-13 DIAGNOSIS — E1122 Type 2 diabetes mellitus with diabetic chronic kidney disease: Secondary | ICD-10-CM | POA: Insufficient documentation

## 2019-01-13 DIAGNOSIS — T82856A Stenosis of peripheral vascular stent, initial encounter: Secondary | ICD-10-CM

## 2019-01-13 HISTORY — PX: LOWER EXTREMITY ANGIOGRAPHY: CATH118251

## 2019-01-13 LAB — CREATININE, SERUM
Creatinine, Ser: 0.79 mg/dL (ref 0.44–1.00)
GFR calc Af Amer: >60 mL/min (ref >60)
GFR calc non Af Amer: >60 mL/min (ref >60)

## 2019-01-13 LAB — GLUCOSE, CAPILLARY
Glucose-Capillary: 126 mg/dL — ABNORMAL HIGH (ref 70–99)
Glucose-Capillary: 205 mg/dL — ABNORMAL HIGH (ref 70–99)
Glucose-Capillary: 49 mg/dL — ABNORMAL LOW (ref 70–99)

## 2019-01-13 LAB — BUN: BUN: 12 mg/dL (ref 8–23)

## 2019-01-13 SURGERY — LOWER EXTREMITY ANGIOGRAPHY
Anesthesia: Moderate Sedation | Laterality: Right

## 2019-01-13 MED ORDER — METHYLPREDNISOLONE SODIUM SUCC 125 MG IJ SOLR: 125.0000 mg | Freq: Once | INTRAMUSCULAR | Status: DC | PRN

## 2019-01-13 MED ORDER — FENTANYL CITRATE (PF) 100 MCG/2ML IJ SOLN
INTRAMUSCULAR | Status: AC
  Filled 2019-01-13: qty 2

## 2019-01-13 MED ORDER — ALTEPLASE 2 MG IJ SOLR
INTRAMUSCULAR | Status: DC | PRN
  Administered 2019-01-13: 8 mg

## 2019-01-13 MED ORDER — SODIUM CHLORIDE 0.9 % IV SOLN: 250.0000 mL | INTRAVENOUS | Status: DC | PRN

## 2019-01-13 MED ORDER — MIDAZOLAM HCL 5 MG/5ML IJ SOLN
INTRAMUSCULAR | Status: AC
  Filled 2019-01-13: qty 5

## 2019-01-13 MED ORDER — SODIUM CHLORIDE 0.9 % IV SOLN: INTRAVENOUS | Status: AC

## 2019-01-13 MED ORDER — SODIUM CHLORIDE 0.9% FLUSH: 3.0000 mL | INTRAVENOUS | Status: DC | PRN

## 2019-01-13 MED ORDER — ONDANSETRON HCL 4 MG/2ML IJ SOLN: 4.0000 mg | Freq: Four times a day (QID) | INTRAMUSCULAR | Status: DC | PRN

## 2019-01-13 MED ORDER — DIPHENHYDRAMINE HCL 50 MG/ML IJ SOLN
INTRAMUSCULAR | Status: AC
  Filled 2019-01-13: qty 1

## 2019-01-13 MED ORDER — DEXTROSE 50 % IV SOLN
INTRAVENOUS | Status: AC
  Administered 2019-01-13: 50 mL via INTRAVENOUS
  Filled 2019-01-13: qty 50

## 2019-01-13 MED ORDER — HYDRALAZINE HCL 20 MG/ML IJ SOLN: 5.0000 mg | INTRAMUSCULAR | Status: DC | PRN

## 2019-01-13 MED ORDER — HEPARIN SODIUM (PORCINE) 1000 UNIT/ML IJ SOLN
INTRAMUSCULAR | Status: DC | PRN
  Administered 2019-01-13: 5000 [IU] via INTRAVENOUS

## 2019-01-13 MED ORDER — MIDAZOLAM HCL 2 MG/2ML IJ SOLN
INTRAMUSCULAR | Status: DC | PRN
  Administered 2019-01-13: 2 mg via INTRAVENOUS
  Administered 2019-01-13 (×3): 0.5 mg via INTRAVENOUS

## 2019-01-13 MED ORDER — SODIUM CHLORIDE 0.9% FLUSH: 3.0000 mL | Freq: Two times a day (BID) | INTRAVENOUS | Status: DC

## 2019-01-13 MED ORDER — MORPHINE SULFATE (PF) 4 MG/ML IV SOLN: 2.0000 mg | INTRAVENOUS | Status: DC | PRN

## 2019-01-13 MED ORDER — ACETAMINOPHEN 325 MG PO TABS: 650.0000 mg | ORAL_TABLET | ORAL | Status: DC | PRN

## 2019-01-13 MED ORDER — MIDAZOLAM HCL 2 MG/ML PO SYRP: 8.0000 mg | ORAL_SOLUTION | Freq: Once | ORAL | Status: DC | PRN

## 2019-01-13 MED ORDER — SODIUM CHLORIDE 0.9 % IV SOLN
INTRAVENOUS | Status: DC
  Administered 2019-01-13: 09:00:00 via INTRAVENOUS

## 2019-01-13 MED ORDER — FENTANYL CITRATE (PF) 100 MCG/2ML IJ SOLN
INTRAMUSCULAR | Status: DC | PRN
  Administered 2019-01-13 (×4): 25 ug via INTRAVENOUS

## 2019-01-13 MED ORDER — LABETALOL HCL 5 MG/ML IV SOLN: 10.0000 mg | INTRAVENOUS | Status: DC | PRN

## 2019-01-13 MED ORDER — DEXTROSE 50 % IV SOLN
1.0000 | Freq: Once | INTRAVENOUS | Status: AC
  Administered 2019-01-13: 13:00:00 50 mL via INTRAVENOUS

## 2019-01-13 MED ORDER — FAMOTIDINE 20 MG PO TABS: 40.0000 mg | ORAL_TABLET | Freq: Once | ORAL | Status: DC | PRN

## 2019-01-13 MED ORDER — OXYCODONE HCL 5 MG PO TABS: 5.0000 mg | ORAL_TABLET | ORAL | Status: DC | PRN

## 2019-01-13 MED ORDER — HEPARIN SODIUM (PORCINE) 1000 UNIT/ML IJ SOLN
INTRAMUSCULAR | Status: AC
  Filled 2019-01-13: qty 1

## 2019-01-13 MED ORDER — HYDROMORPHONE HCL 1 MG/ML IJ SOLN: 1.0000 mg | Freq: Once | INTRAMUSCULAR | Status: DC | PRN

## 2019-01-13 MED ORDER — DIPHENHYDRAMINE HCL 50 MG/ML IJ SOLN: 50.0000 mg | Freq: Once | INTRAMUSCULAR | Status: DC | PRN

## 2019-01-13 MED ORDER — DIPHENHYDRAMINE HCL 50 MG/ML IJ SOLN
INTRAMUSCULAR | Status: DC | PRN
  Administered 2019-01-13: 12.5 mg via INTRAVENOUS

## 2019-01-13 SURGICAL SUPPLY — 35 items
BALLN LUTONIX 018 5X220X130 (BALLOONS) ×6
BALLN LUTONIX 018 6X100X130 (BALLOONS) ×6
BALLN LUTONIX 018 6X40X130 (BALLOONS) ×3
BALLN LUTONIX 018 6X80X130 (BALLOONS) ×3
BALLN ULTRASCORE 6X40X130 (BALLOONS) ×3
BALLOON LUTONIX 018 5X220X130 (BALLOONS) IMPLANT
BALLOON LUTONIX 018 6X100X130 (BALLOONS) IMPLANT
BALLOON LUTONIX 018 6X40X130 (BALLOONS) IMPLANT
BALLOON LUTONIX 018 6X80X130 (BALLOONS) IMPLANT
BALLOON ULTRASCORE 6X40X130 (BALLOONS) IMPLANT
CANISTER PENUMBRA ENGINE (MISCELLANEOUS) ×2 IMPLANT
CATH BEACON 5 .035 65 KMP TIP (CATHETERS) ×2 IMPLANT
CATH INDIGO CAT6 KIT (CATHETERS) ×2 IMPLANT
CATH INFUS 135CMX30CM (CATHETERS) ×2 IMPLANT
CATH PIG 70CM (CATHETERS) ×2 IMPLANT
CATH VERT 5FR 125CM (CATHETERS) ×2 IMPLANT
COVER PROBE U/S 5X48 (MISCELLANEOUS) ×2 IMPLANT
DEVICE PRESTO INFLATION (MISCELLANEOUS) ×2 IMPLANT
DEVICE TORQUE .025-.038 (MISCELLANEOUS) ×2 IMPLANT
NDL ENTRY 21GA 7CM ECHOTIP (NEEDLE) IMPLANT
NEEDLE ENTRY 21GA 7CM ECHOTIP (NEEDLE) ×3 IMPLANT
PACK ANGIOGRAPHY (CUSTOM PROCEDURE TRAY) ×3 IMPLANT
SET INTRO CAPELLA COAXIAL (SET/KITS/TRAYS/PACK) ×2 IMPLANT
SHEATH ANL2 6FRX45 HC (SHEATH) ×2 IMPLANT
SHEATH BRITE TIP 5FRX11 (SHEATH) ×2 IMPLANT
SHEATH PINNACLE ST 6F 45CM (SHEATH) ×2 IMPLANT
STENT LIFESTENT 5F 6X100X135 (Permanent Stent) ×2 IMPLANT
STENT LIFESTENT 5F 6X40X135 (Permanent Stent) ×2 IMPLANT
STENT LIFESTENT 5F 6X60X135 (Permanent Stent) ×2 IMPLANT
SYR MEDRAD MARK V 150ML (SYRINGE) ×2 IMPLANT
TUBING CONTRAST HIGH PRESS 72 (TUBING) ×2 IMPLANT
WIRE AQUATRACK .035X260CM (WIRE) ×2 IMPLANT
WIRE J 3MM .035X145CM (WIRE) ×2 IMPLANT
WIRE MAGIC TORQUE 260C (WIRE) ×2 IMPLANT
WIRE RUNTHROUGH .014X300CM (WIRE) ×2 IMPLANT

## 2019-01-13 NOTE — Progress Notes (Signed)
BG: 205 now. Pt. Remains asymptomatic. Call to pt. Daughter Lequita Halt: RN spoke with pt. Daughter re: post DC instructions with verbalization of understanding. Pt. Also spoke with daughter on phone (secondary to [place] 19)

## 2019-01-13 NOTE — Progress Notes (Signed)
BG: 49. Pt. Asymptomatic. Call to MD : orders received. Pt. Med. With 1 amp D50 W IV slow push. Also given 120 ml grape juice p.o.

## 2019-01-13 NOTE — Op Note (Signed)
[facility] Percutaneous Study/Intervention Procedural Note   Date of Surgery: 01/13/2019  Surgeon:  Katha Cabal, MD.  Pre-operative Diagnosis: Atherosclerotic occlusive disease bilateral lower extremities with rest pain of the right lower extremity; complication vascular device with occlusion of SFA stents  Post-operative diagnosis: Same  Procedure(s) Performed: 1. Introduction catheter into right lower extremity 3rd order catheter placement  2. Contrast injection right lower extremity for distal runoff   3. Percutaneous transluminal angioplasty and stent placement right superficial femoral artery and popliteal 4. Mechanical thrombectomy of occluded SFA stents using 8 mg of TPA in association with the penumbra CAT 6 thrombectomy catheter               Anesthesia: Conscious sedation was administered under my direct supervision by the interventional radiology RN. IV Versed plus fentanyl were utilized. Continuous ECG, pulse oximetry and blood pressure was monitored throughout the entire procedure.  Conscious sedation was for a total of 140 minutes.  Sheath: 6 French destination retrograde left common femoral  Contrast: 130 cc  Fluoroscopy Time: 23.6 minutes  Indications: Hannah Conway presents with increasing pain of the right lower extremity.  Noninvasive studies as well as physical examination suggested occlusion of the previously placed stents.  Given her worsening symptoms revascularization was recommended.  The risks and benefits are reviewed all questions answered patient agrees to proceed.  Procedure: Hannah Conway is a 85 y.o. y.o. female who was identified and appropriate procedural time out was performed. The patient was then placed supine on the table and prepped and draped in the usual sterile fashion.   Ultrasound was placed in the sterile sleeve and the left groin was evaluated the left  common femoral artery was echolucent and pulsatile indicating patency.  Image was recorded for the permanent record and under real-time visualization a microneedle was inserted into the common femoral artery microwire followed by a micro-sheath.  A J-wire was then advanced through the micro-sheath and a  5 [place] sheath was then inserted over a J-wire. J-wire was then advanced and a 5 French pigtail catheter was positioned at the level of T12. AP projection of the aorta was then obtained. Pigtail catheter was repositioned to above the bifurcation and a LAO view of the pelvis was obtained.  Subsequently a pigtail catheter with the stiff angle Glidewire was used to cross the aortic bifurcation the catheter wire were advanced down into the right distal external iliac artery. Oblique view of the femoral bifurcation was then obtained and subsequently the wire was reintroduced and the pigtail catheter negotiated into the SFA representing third order catheter placement. Distal runoff was then performed.  Diagnostic interpretation: The abdominal aorta is opacified with a bolus injection of contrast.  No hemodynamically significant lesions are noted.  The common iliac arteries are patent bilaterally without evidence of hemodynamically significant stenosis.  There is mild to moderate disease in the proximal right external iliac artery but it does not appear to have a hemodynamically significant lesion.  On the left previously placed stent within the left external iliac artery appears to have approximately 60% in-stent restenosis.  The right common femoral demonstrates mild to moderate disease with approximately 50% stenosis at the origin of the profunda distally the profunda appears diseased but patent.  There are moderate collaterals filling the distal runoff.  The SFA appears to be a flush occlusion and the previously placed stents are occluded.  The popliteal artery is reconstituted at the level of the tibioperoneal  trunk and the peers to be significant disease at the trifurcation although the anterior tibial is patent and appears to be open down to the foot filling the dorsalis pedis.  Tibioperoneal trunk demonstrates moderate to severe disease in the peroneal and posterior tibial appears to occlude.  Based on these images I elected to move forward with intervention.  5000 units of heparin was then given and allowed to circulate and a 6 [place] destination sheath was advanced up and over the bifurcation and positioned in the femoral artery  KMP  catheter and stiff angle Glidewire were then negotiated down into the distal popliteal.  Intraluminal position was then verified by hand injection through the catheter.   A infusion catheter with 30 cm infusion length was then selected and advanced over the wire and positioned across the SFA and popliteal.  A milligrams TPA was then reconstituted and infused into the SFA and popliteal.  Dwell time was allowed to be 30 minutes.  Follow-up imaging demonstrated there was now trickle flow.  Therefore, the penumbra cat 6 device was used multiple passes were performed beginning in the common femoral and extending down to the mid popliteal.  Follow-up imaging with hand-injection now demonstrated improved flow but multiple greater than 70% stenoses were identified throughout the stent probably the most severe of which was at the origin of the SFA.  The wire was then reintroduced and initially two 5 mm x 220 mm Lutonix drug-eluting balloons were used to angioplasty the superficial femoral and popliteal arteries in serial fashion.. Inflations were to 12 atmospheres for 2 minutes. Follow-up imaging demonstrated patency with adequate preparation of the vessel for a drug-coated balloon.  I then used a 6 mm x 40 mm ultra score balloon at the origin of the SFA inflation was to 12 atm for 1 minute.  Follow-up imaging demonstrated greater than 40% residual stenosis and I deployed a 6 mm x 40  mm life stent positioning the proximal edge of the stent at the origin of the SFA.  Subsequently, a 6 mm x 40 Lutonix balloon was utilized inflating to 12 atm for 1 minute. Follow-up imaging demonstrated greater than 50% residual stenosis at 2 more locations and therefore a 6 mm x 100 mm life stent and a 6 mm x 80 mm life stent stent was deployed and subsequently postdilated with a 6 mm Lutonix drug-eluting balloons.  Inflations were to 12 atm for 1 minute each.  Distal runoff was then reassessed.  After review of these images the sheath is pulled into the left external iliac oblique of the common femoral is obtained.  The sheath is then pulled and pressure is held.  There no immediate complications.   Findings:  The abdominal aorta is opacified with a bolus injection of contrast.  No hemodynamically significant lesions are noted.  The common iliac arteries are patent bilaterally without evidence of hemodynamically significant stenosis.  There is mild to moderate disease in the proximal right external iliac artery but it does not appear to have a hemodynamically significant lesion.  On the left previously placed stent within the left external iliac artery appears to have approximately 60% in-stent restenosis.  The right common femoral demonstrates mild to moderate disease with approximately 50% stenosis at the origin of the profunda distally the profunda appears diseased but patent.  There are moderate collaterals filling the distal runoff.  The SFA appears to be a flush occlusion and the previously placed stents are occluded.  The popliteal artery is reconstituted  at the level of the tibioperoneal trunk and the peers to be significant disease at the trifurcation although the anterior tibial is patent and appears to be open down to the foot filling the dorsalis pedis.  Tibioperoneal trunk demonstrates moderate to severe disease in the peroneal and posterior tibial appears to occlude.  The TPA followed by  mechanical thrombectomy reestablished flow through the entire stented length of the SFA and proximal popliteal.  It uncovered multiple lesions with 3 distinct lesions one at the origin 1 approximately one third of the way down one distally at Hunter's canal all of which were greater than 70%.  Following angioplasty the origin lesion remains greater than 40% as do the 2 other mentioned lesions and therefore life stents were deployed as described above and postdilated to 6 mm with Lutonix drug-eluting balloons.  At the conclusion there is now wide patency of the common femoral SFA and popliteal.  Distal runoff is preserved it essentially is single-vessel via the anterior tibial which is continuous to the foot.  There is now less than 10% residual stenosis.   Summary: Successful recanalization right lower extremity for limb salvage.  In the recovery room the patient has a palpable dorsalis pedis pulse    Disposition: Patient was taken to the recovery room in stable condition having tolerated the procedure well.  Hannah Conway, Hannah Conway 01/13/2019,12:44 PM

## 2019-01-13 NOTE — H&P (Signed)
[facility] History & Physical Update  The patient was interviewed and re-examined.  The patient's previous History and Physical has been reviewed and is unchanged.  There is no change in the plan of care. We plan to proceed with the scheduled procedure.  Hortencia Pilar, MD  01/13/2019, 8:23 AM

## 2019-01-13 NOTE — Progress Notes (Signed)
Dr. Delana Meyer at bedside, speaking with pt. Re: procedural results. Pt. Verbalized understanding. No cardiac, vascular or subjective c/o at present.

## 2019-01-16 ENCOUNTER — Encounter: Payer: Self-pay | Admitting: Vascular Surgery

## 2019-01-30 ENCOUNTER — Ambulatory Visit (INDEPENDENT_AMBULATORY_CARE_PROVIDER_SITE_OTHER): Payer: Medicare Other

## 2019-01-30 ENCOUNTER — Other Ambulatory Visit: Payer: Self-pay

## 2019-01-30 ENCOUNTER — Ambulatory Visit (INDEPENDENT_AMBULATORY_CARE_PROVIDER_SITE_OTHER): Payer: Medicare Other | Admitting: Vascular Surgery

## 2019-01-30 ENCOUNTER — Other Ambulatory Visit (INDEPENDENT_AMBULATORY_CARE_PROVIDER_SITE_OTHER): Payer: Self-pay | Admitting: Vascular Surgery

## 2019-01-30 ENCOUNTER — Encounter (INDEPENDENT_AMBULATORY_CARE_PROVIDER_SITE_OTHER): Payer: Self-pay | Admitting: Vascular Surgery

## 2019-01-30 VITALS — BP 123/67 | HR 88 | Resp 16

## 2019-01-30 DIAGNOSIS — Z95820 Peripheral vascular angioplasty status with implants and grafts: Secondary | ICD-10-CM | POA: Diagnosis not present

## 2019-01-30 DIAGNOSIS — E1122 Type 2 diabetes mellitus with diabetic chronic kidney disease: Secondary | ICD-10-CM | POA: Diagnosis not present

## 2019-01-30 DIAGNOSIS — I70221 Atherosclerosis of native arteries of extremities with rest pain, right leg: Secondary | ICD-10-CM

## 2019-01-30 DIAGNOSIS — I10 Essential (primary) hypertension: Secondary | ICD-10-CM

## 2019-01-30 DIAGNOSIS — I70213 Atherosclerosis of native arteries of extremities with intermittent claudication, bilateral legs: Secondary | ICD-10-CM

## 2019-01-30 DIAGNOSIS — E1169 Type 2 diabetes mellitus with other specified complication: Secondary | ICD-10-CM

## 2019-01-30 DIAGNOSIS — Z794 Long term (current) use of insulin: Secondary | ICD-10-CM

## 2019-01-30 DIAGNOSIS — E1159 Type 2 diabetes mellitus with other circulatory complications: Secondary | ICD-10-CM | POA: Diagnosis not present

## 2019-01-30 DIAGNOSIS — K219 Gastro-esophageal reflux disease without esophagitis: Secondary | ICD-10-CM

## 2019-01-30 DIAGNOSIS — E785 Hyperlipidemia, unspecified: Secondary | ICD-10-CM

## 2019-01-30 DIAGNOSIS — I152 Hypertension secondary to endocrine disorders: Secondary | ICD-10-CM

## 2019-01-30 DIAGNOSIS — N183 Chronic kidney disease, stage 3 (moderate): Secondary | ICD-10-CM

## 2019-01-30 NOTE — Progress Notes (Signed)
MRN : [id number]  Hannah Conway is a 85 y.o. (1929-10-26) female who presents with chief complaint of  Chief Complaint  Patient presents with   Follow-up    [facility] 2week abi  .  History of Present Illness:   The patient returns to the office for followup and review of the noninvasive studies. There have been no interval changes in lower extremity symptoms. No interval shortening of the patient's claudication distance or development of rest pain symptoms. No new ulcers or wounds have occurred since the last visit.  There have been no significant changes to the patient's overall health care.  The patient denies amaurosis fugax or recent TIA symptoms. There are no recent neurological changes noted. The patient denies history of DVT, PE or superficial thrombophlebitis. The patient denies recent episodes of angina or shortness of breath.   Current Meds  Medication Sig   acetaminophen (TYLENOL) 500 MG tablet Take 1,000 mg by mouth every 6 (six) hours as needed for moderate pain.    aspirin EC 81 MG tablet Take 81 mg by mouth daily.    benazepril-hydrochlorthiazide (LOTENSIN HCT) 20-12.5 MG tablet Take 1 tablet by mouth daily.   buPROPion (WELLBUTRIN XL) 150 MG 24 hr tablet Take 150 mg by mouth daily.   Calcium Citrate-Vitamin D (CALCIUM CITRATE + D3 PO) Take 2 tablets by mouth daily with lunch.   Cinnamon 500 MG TABS Take 500 mg by mouth daily.   clopidogrel (PLAVIX) 75 MG tablet Take 1 tablet (75 mg total) by mouth daily with breakfast.   Multiple Vitamin (MULTIVITAMIN WITH MINERALS) TABS tablet Take 1 tablet by mouth daily. Complete Multivitamin   NOVOLOG MIX 70/30 FLEXPEN (70-30) 100 UNIT/ML FlexPen Inject 35-55 Units into the skin See admin instructions. Inject 55 units subcutaneously in the morning and 35 in the evening   Omega-3 Fatty Acids (FISH OIL) 1000 MG CAPS Take 1,000 mg by mouth 2 (two) times daily.   pravastatin (PRAVACHOL) 20 MG tablet Take 20 mg by mouth every  evening.     Past Medical History:  Diagnosis Date   Diabetes mellitus without complication (HCC)    Hypertension    Peripheral artery disease (HCC)     Past Surgical History:  Procedure Laterality Date   ABDOMINAL HYSTERECTOMY     85 y.o. at time   CHOLECYSTECTOMY  1965   JOINT REPLACEMENT  2016   Bilat. Hip Replacements    LOWER EXTREMITY ANGIOGRAPHY Right 08/09/2018   Procedure: LOWER EXTREMITY ANGIOGRAPHY;  Surgeon: Renford DillsSchnier, Terisa Belardo G, MD;  Location: [facility];  Service: Cardiovascular;  Laterality: Right;   LOWER EXTREMITY ANGIOGRAPHY Right 01/13/2019   Procedure: LOWER EXTREMITY ANGIOGRAPHY;  Surgeon: Renford DillsSchnier, Kyndra Condron G, MD;  Location: [facility];  Service: Cardiovascular;  Laterality: Right;    Social History Social History   Tobacco Use   Smoking status: Current Every Day Smoker    Packs/day: 1.00    Years: 30.00    Pack years: 30.00    Types: Cigarettes   Smokeless tobacco: Never Used  Substance Use Topics   Alcohol use: Never    Frequency: Never   Drug use: Not Currently    Family History Family History  Problem Relation Age of Onset   Congestive Heart Failure Mother    Diabetes Father    Diabetes Sister     Allergies  Allergen Reactions   Atorvastatin Other (See Comments)    "Felt as if she would pass out"  Simvastatin Other (See Comments)    "Felt as if she would pass out"      REVIEW OF SYSTEMS (Negative unless checked)  Constitutional: [] Weight loss  [] Fever  [] Chills Cardiac: [] Chest pain   [] Chest pressure   [] Palpitations   [] Shortness of breath when laying flat   [] Shortness of breath with exertion. Vascular:  [x] Pain in legs with walking   [] Pain in legs at rest  [] History of DVT   [] Phlebitis   [] Swelling in legs   [] Varicose veins   [] Non-healing ulcers Pulmonary:   [] Uses home oxygen   [] Productive cough   [] Hemoptysis   [] Wheeze  [] COPD   [] Asthma Neurologic:  [] Dizziness   [] Seizures    [] History of stroke   [] History of TIA  [] Aphasia   [] Vissual changes   [] Weakness or numbness in arm   [] Weakness or numbness in leg Musculoskeletal:   [] Joint swelling   [] Joint pain   [] Low back pain Hematologic:  [] Easy bruising  [] Easy bleeding   [] Hypercoagulable state   [] Anemic Gastrointestinal:  [] Diarrhea   [] Vomiting  [x] Gastroesophageal reflux/heartburn   [] Difficulty swallowing. Genitourinary:  [] Chronic kidney disease   [] Difficult urination  [] Frequent urination   [] Blood in urine Skin:  [] Rashes   [] Ulcers  Psychological:  [] History of anxiety   []  History of major depression.  Physical Examination  Vitals:   01/30/19 1608  BP: 123/67  Pulse: 88  Resp: 16   There is no height or weight on file to calculate BMI. Gen: WD/WN, NAD Head: [place]/AT, No temporalis wasting.  Ear/Nose/Throat: Hearing grossly intact, nares w/o erythema or drainage Eyes: PER, EOMI, sclera nonicteric.  Neck: Supple, no large masses.   Pulmonary:  Good air movement, no audible wheezing bilaterally, no use of accessory muscles.  Cardiac: RRR, no JVD Vascular:  Vessel Right Left  Radial Palpable Palpable  PT Palpable Not Palpable  DP Palpable Not Palpable  Gastrointestinal: Non-distended. No guarding/no peritoneal signs.  Musculoskeletal: M/S 5/5 throughout.  No deformity or atrophy.  Neurologic: CN 2-12 intact. Symmetrical.  Speech is fluent. Motor exam as listed above. Psychiatric: Judgment intact, Mood & affect appropriate for pt's clinical situation. Dermatologic: No rashes or ulcers noted.  No changes consistent with cellulitis. Lymph : No lichenification or skin changes of chronic lymphedema.  CBC Lab Results  Component Value Date   WBC 13.7 (H) 08/10/2018   HGB 11.0 (L) 08/10/2018   HCT 33.2 (L) 08/10/2018   MCV 94.1 08/10/2018   PLT 253 08/10/2018    BMET    Component Value Date/Time   NA 134 (L) 08/10/2018 0418   NA 133 (L) 08/24/2013 0735   K 4.6 08/10/2018 0418   K 4.0  09/27/2014 1517   CL 99 08/10/2018 0418   CL 98 08/24/2013 0735   CO2 28 08/10/2018 0418   CO2 29 08/24/2013 0735   GLUCOSE 204 (H) 08/10/2018 0418   GLUCOSE 125 (H) 08/24/2013 0735   BUN 12 01/13/2019 0909   BUN 16 08/24/2013 0735   CREATININE 0.79 01/13/2019 0909   CREATININE 0.91 08/24/2013 0735   CALCIUM 9.3 08/10/2018 0418   CALCIUM 10.0 08/24/2013 0735   GFRNONAA >60 01/13/2019 0909   GFRNONAA 59 (L) 08/24/2013 0735   GFRAA >60 01/13/2019 0909   GFRAA >60 08/24/2013 0735   CrCl cannot be calculated (Unknown ideal weight.).  [facility] Lab Results  Component Value Date   INR 1.0 07/25/2013    Radiology Vas [place] Vanice Sarahbi With/wo Tbi  Result Date: 01/05/2019 LOWER  EXTREMITY DOPPLER STUDY Indications: Peripheral artery disease, and 08/09/2018              Bilat CIA PTA stents              Left EIA PTA stent              Right SFA to Pop PTA stent.  Comparison Study: 08/24/2018 Performing Technologist: Charlane Ferretti RT (R)(VS)  Examination Guidelines: A complete evaluation includes at minimum, Doppler waveform signals and systolic blood pressure reading at the level of bilateral brachial, anterior tibial, and posterior tibial arteries, when vessel segments are accessible. Bilateral testing is considered an integral part of a complete examination. Photoelectric Plethysmograph (PPG) waveforms and toe systolic pressure readings are included as required and additional duplex testing as needed. Limited examinations for reoccurring indications may be performed as noted.  ABI Findings: +---------+------------------+-----+----------+--------+  Right     Rt Pressure (mmHg) Index Waveform   Comment   +---------+------------------+-----+----------+--------+  Brachial  140                                           +---------+------------------+-----+----------+--------+  ATA       60                 0.43  biphasic             +---------+------------------+-----+----------+--------+  PTA       62                  0.44  monophasic           +---------+------------------+-----+----------+--------+  Great Toe 20                 0.14  Abnormal             +---------+------------------+-----+----------+--------+ +---------+------------------+-----+----------+-------+  Left      Lt Pressure (mmHg) Index Waveform   Comment  +---------+------------------+-----+----------+-------+  Brachial  137                                          +---------+------------------+-----+----------+-------+  ATA       81                 0.58  biphasic            +---------+------------------+-----+----------+-------+  PTA       68                 0.49  monophasic          +---------+------------------+-----+----------+-------+  Great Toe 65                 0.46  Abnormal            +---------+------------------+-----+----------+-------+ +-------+-----------+-----------+------------+------------+  ABI/TBI Today's ABI Today's TBI Previous ABI Previous TBI  +-------+-----------+-----------+------------+------------+  Right   .44         .14         .95          .63           +-------+-----------+-----------+------------+------------+  Left    .58         .46         .59          .53           +-------+-----------+-----------+------------+------------+  Right ABIs appear decreased compared to prior study on 08/24/2018. Left ABIs appear essentially unchanged compared to prior study on 08/24/2018. Bilateral TBI's appear decreased from the previoue exam on 08/24/2018. Summary: Right: Resting right ankle-brachial index indicates severe right lower extremity arterial disease. The right toe-brachial index is abnormal. Left: Resting left ankle-brachial index indicates moderate left lower extremity arterial disease. The left toe-brachial index is abnormal.  *See table(s) above for measurements and observations.  Electronically signed by Levora DredgeGregory Dezmon Conover MD on 01/05/2019 at 5:52:34 PM.   Final    Vas [place] Lower Extremity Arterial Duplex  Result Date: 01/30/2019 LOWER  EXTREMITY ARTERIAL DUPLEX STUDY Indications: Peripheral artery disease, and 08/09/2018              Bilat CIA PTA stents              Left EIA PTA stent              Right SFA to Pop PTA stent.  Vascular Interventions: 01/13/2019: PTA and Stent to the Right SFA and Popliteal                         Artery. Mechanical Thrombectomy of Occluded Rt SFA                         stents. Current ABI:            Rt 1.04, Lt .73 Comparison Study: 01/05/2019 Performing Technologist: Debbe BalesSolomon Mcclary RVS  Examination Guidelines: A complete evaluation includes B-mode imaging, spectral Doppler, color Doppler, and power Doppler as needed of all accessible portions of each vessel. Bilateral testing is considered an integral part of a complete examination. Limited examinations for reoccurring indications may be performed as noted.  +-----------+--------+-----+--------+--------+----------+  RIGHT       PSV cm/s Ratio Stenosis Waveform Comments    +-----------+--------+-----+--------+--------+----------+  CFA Distal  94                      biphasic             +-----------+--------+-----+--------+--------+----------+  DFA         218                     biphasic             +-----------+--------+-----+--------+--------+----------+  SFA Prox    157                     biphasic Stent Prox  +-----------+--------+-----+--------+--------+----------+  SFA Mid     123                     biphasic             +-----------+--------+-----+--------+--------+----------+  SFA Distal  125                     biphasic             +-----------+--------+-----+--------+--------+----------+  POP Distal  87                      biphasic             +-----------+--------+-----+--------+--------+----------+  ATA Distal  72                      biphasic             +-----------+--------+-----+--------+--------+----------+  PTA Distal  39                      biphasic             +-----------+--------+-----+--------+--------+----------+  PERO Distal 54                       biphasic             +-----------+--------+-----+--------+--------+----------+  +-----------+--------+-----+--------+--------+--------+  LEFT        PSV cm/s Ratio Stenosis Waveform Comments  +-----------+--------+-----+--------+--------+--------+  CFA Distal  140                     biphasic           +-----------+--------+-----+--------+--------+--------+  DFA         66                      biphasic           +-----------+--------+-----+--------+--------+--------+  SFA Prox    138                     biphasic           +-----------+--------+-----+--------+--------+--------+  SFA Mid     144                     biphasic           +-----------+--------+-----+--------+--------+--------+  SFA Distal  51                      biphasic           +-----------+--------+-----+--------+--------+--------+  POP Distal  47                      biphasic           +-----------+--------+-----+--------+--------+--------+  ATA Distal  27                      biphasic           +-----------+--------+-----+--------+--------+--------+  PTA Distal  28                      biphasic           +-----------+--------+-----+--------+--------+--------+  PERO Distal 19                      biphasic           +-----------+--------+-----+--------+--------+--------+  Summary: Right: Biphasic Flow seen Predominantly in the Right Lower Extremity compared to Monophasic on the [place] Exam. Left: Biphasic Flow seen Predominantly in the Left Lower Extremity compared to Monophasic on the [place] Exam.  See table(s) above for measurements and observations. Electronically signed by Levora Dredge MD on 01/30/2019 at 5:14:37 PM.    Final    Vas [place] Lower Extremity Arterial Duplex  Result Date: 01/05/2019 LOWER EXTREMITY ARTERIAL DUPLEX STUDY Indications: Rest pain, and peripheral artery disease.  Current ABI: Right = .44 Left = .58 Performing Technologist: Reece Agar RT (R)(VS)  Examination Guidelines: A complete evaluation includes  B-mode imaging, spectral Doppler, color Doppler, and power Doppler as needed of all accessible portions of each vessel. Bilateral testing is considered an integral part of a complete examination. Limited examinations for reoccurring indications may be performed as noted.  +-----------+--------+-----+---------------+----------+-----------+  LEFT  PSV cm/s Ratio Stenosis        Waveform   Comments     +-----------+--------+-----+---------------+----------+-----------+  CFA Prox    200                            triphasic               +-----------+--------+-----+---------------+----------+-----------+  DFA         121                            triphasic               +-----------+--------+-----+---------------+----------+-----------+  SFA Prox    130                            monophasic              +-----------+--------+-----+---------------+----------+-----------+  SFA Mid     293            50-74% stenosis monophasic ratio = 2.3  +-----------+--------+-----+---------------+----------+-----------+  SFA Distal  342            50-74% stenosis monophasic ratio = 1.2  +-----------+--------+-----+---------------+----------+-----------+  POP Prox    153                            monophasic              +-----------+--------+-----+---------------+----------+-----------+  POP Distal  50                             monophasic              +-----------+--------+-----+---------------+----------+-----------+  ATA Distal  30                             monophasic              +-----------+--------+-----+---------------+----------+-----------+  PTA Distal  20                             monophasic              +-----------+--------+-----+---------------+----------+-----------+  PERO Distal 22                             monophasic              +-----------+--------+-----+---------------+----------+-----------+  Summary: Left: 50-74% stenosis noted in the superficial femoral artery. 30-49% stenosis noted in the popliteal  artery. Plaque visualized throughout the CFA and femoral arteries.  See table(s) above for measurements and observations. Electronically signed by Levora Dredge MD on 01/05/2019 at 5:52:37 PM.    Final      Assessment/Plan 1. Atherosclerosis of native artery of both lower extremities with intermittent claudication (HCC) Recommend:  The patient is status post successful angiogram with intervention.  The patient reports that the claudication symptoms and leg pain is essentially gone.   The patient denies lifestyle limiting changes at this point in time.  No further invasive studies, angiography or surgery at this time The patient should continue walking and begin a more formal exercise program.  The patient should continue antiplatelet  therapy and aggressive treatment of the lipid abnormalities  Smoking cessation was again discussed  The patient should continue wearing graduated compression socks 10-15 mmHg strength to control the mild edema.  Patient should undergo noninvasive studies as ordered. The patient will follow up with me after the studies.   - VAS [place] ABI WITH/WO TBI; Future - VAS [place] LOWER EXTREMITY ARTERIAL DUPLEX; Future  2. Hypertension associated with type 2 diabetes mellitus (HCC) Continue antihypertensive medications as already ordered, these medications have been reviewed and there are no changes at this time.   3. Hyperlipidemia associated with type 2 diabetes mellitus (HCC) Continue statin as ordered and reviewed, no changes at this time   4. Type 2 diabetes mellitus with stage 3 chronic kidney disease, with long-term current use of insulin (HCC) Continue hypoglycemic medications as already ordered, these medications have been reviewed and there are no changes at this time.  Hgb A1C to be monitored as already arranged by primary service   5. Gastroesophageal reflux disease without esophagitis Continue PPI as already ordered, this medication has been reviewed  and there are no changes at this time.  Avoidence of caffeine and alcohol  Moderate elevation of the head of the bed    Levora Dredge, MD  01/30/2019 8:45 PM

## 2019-03-13 ENCOUNTER — Encounter (INDEPENDENT_AMBULATORY_CARE_PROVIDER_SITE_OTHER): Payer: Medicare Other

## 2019-03-13 ENCOUNTER — Ambulatory Visit (INDEPENDENT_AMBULATORY_CARE_PROVIDER_SITE_OTHER): Payer: Medicare Other | Admitting: Vascular Surgery

## 2019-03-23 ENCOUNTER — Encounter (INDEPENDENT_AMBULATORY_CARE_PROVIDER_SITE_OTHER): Payer: Medicare Other

## 2019-03-23 ENCOUNTER — Ambulatory Visit (INDEPENDENT_AMBULATORY_CARE_PROVIDER_SITE_OTHER): Payer: Medicare Other | Admitting: Nurse Practitioner

## 2019-08-01 ENCOUNTER — Other Ambulatory Visit (INDEPENDENT_AMBULATORY_CARE_PROVIDER_SITE_OTHER): Payer: Self-pay | Admitting: Vascular Surgery

## 2019-08-03 ENCOUNTER — Ambulatory Visit (INDEPENDENT_AMBULATORY_CARE_PROVIDER_SITE_OTHER): Payer: Medicare Other | Admitting: Vascular Surgery

## 2019-08-03 ENCOUNTER — Encounter (INDEPENDENT_AMBULATORY_CARE_PROVIDER_SITE_OTHER): Payer: Medicare Other

## 2019-11-01 ENCOUNTER — Other Ambulatory Visit (INDEPENDENT_AMBULATORY_CARE_PROVIDER_SITE_OTHER): Payer: Self-pay | Admitting: Nurse Practitioner

## 2020-01-31 ENCOUNTER — Ambulatory Visit (INDEPENDENT_AMBULATORY_CARE_PROVIDER_SITE_OTHER): Payer: Medicare Other | Admitting: Nurse Practitioner

## 2020-01-31 ENCOUNTER — Ambulatory Visit (INDEPENDENT_AMBULATORY_CARE_PROVIDER_SITE_OTHER): Payer: Medicare Other

## 2020-01-31 ENCOUNTER — Other Ambulatory Visit: Payer: Self-pay

## 2020-01-31 ENCOUNTER — Encounter (INDEPENDENT_AMBULATORY_CARE_PROVIDER_SITE_OTHER): Payer: Self-pay | Admitting: Nurse Practitioner

## 2020-01-31 VITALS — BP 127/66 | HR 83 | Resp 16

## 2020-01-31 DIAGNOSIS — E1169 Type 2 diabetes mellitus with other specified complication: Secondary | ICD-10-CM | POA: Diagnosis not present

## 2020-01-31 DIAGNOSIS — E1159 Type 2 diabetes mellitus with other circulatory complications: Secondary | ICD-10-CM

## 2020-01-31 DIAGNOSIS — I10 Essential (primary) hypertension: Secondary | ICD-10-CM | POA: Diagnosis not present

## 2020-01-31 DIAGNOSIS — I70213 Atherosclerosis of native arteries of extremities with intermittent claudication, bilateral legs: Secondary | ICD-10-CM

## 2020-01-31 DIAGNOSIS — E785 Hyperlipidemia, unspecified: Secondary | ICD-10-CM

## 2020-02-01 ENCOUNTER — Telehealth (INDEPENDENT_AMBULATORY_CARE_PROVIDER_SITE_OTHER): Payer: Self-pay

## 2020-02-01 NOTE — Telephone Encounter (Signed)
Spoke with the patient's daughter and she is scheduled with Dr. Gilda Crease for a LLE angio on 02/06/20 with a 10:00 am arrival time to the [facility]. Covid testing is on 02/02/20 between 8-1 pm at the [facility]. Pre-procedure instructions were discussed and will be placed at the front desk for pick up.

## 2020-02-02 ENCOUNTER — Other Ambulatory Visit: Payer: Self-pay

## 2020-02-02 ENCOUNTER — Other Ambulatory Visit
Admission: RE | Admit: 2020-02-02 | Discharge: 2020-02-02 | Disposition: A | Discharge status: Home / Self Care | Payer: Medicare Other | Source: Ambulatory Visit | Attending: Vascular Surgery | Admitting: Vascular Surgery

## 2020-02-02 DIAGNOSIS — Z20822 Contact with and (suspected) exposure to covid-19: Secondary | ICD-10-CM | POA: Diagnosis not present

## 2020-02-02 DIAGNOSIS — Z01812 Encounter for preprocedural laboratory examination: Secondary | ICD-10-CM | POA: Insufficient documentation

## 2020-02-03 LAB — SARS CORONAVIRUS 2 (TAT 6-24 HRS): SARS Coronavirus 2: NEGATIVE

## 2020-02-05 ENCOUNTER — Encounter (INDEPENDENT_AMBULATORY_CARE_PROVIDER_SITE_OTHER): Payer: Self-pay | Admitting: Nurse Practitioner

## 2020-02-05 NOTE — Progress Notes (Signed)
Subjective:    Patient ID: Hannah Conway, female    DOB: [birth year], 86 y.o.   MRN: [id number] Chief Complaint  Patient presents with  . Follow-up    u/s follow up    The patient returns to the office for followup and review of the noninvasive studies. There has been a significant deterioration in the lower extremity symptoms.  The patient notes interval shortening of their claudication distance and development of mild rest pain symptoms. No new ulcers or wounds have occurred since the last visit.  There have been no significant changes to the patient's overall health care.  The patient denies amaurosis fugax or recent TIA symptoms. There are no recent neurological changes noted. The patient denies history of DVT, PE or superficial thrombophlebitis. The patient denies recent episodes of angina or shortness of breath.   ABI's Rt=1.04 and Lt=0.75 (previous ABI's Rt=1.04 and Lt=0.73) Duplex US of the lower extremity arterial system shows biphasic waveforms of the right lower extremity with good toe waveforms.  The left lower extremity also has biphasic waveforms however more dampened in the peroneal with acceptable toe waveforms.   Review of Systems  Cardiovascular:       Claudication  Neurological: Positive for weakness.  All other systems reviewed and are negative.      Objective:   Physical Exam Vitals reviewed.  HENT:     Head: Normocephalic.  Cardiovascular:     Rate and Rhythm: Normal rate and regular rhythm.     Pulses: Normal pulses.  Pulmonary:     Effort: Pulmonary effort is normal.  Skin:    Capillary Refill: Capillary refill takes less than 2 seconds.  Neurological:     Mental Status: She is alert and oriented to person, place, and time. Mental status is at baseline.     Motor: Weakness present.     Gait: Gait abnormal.  Psychiatric:        Mood and Affect: Mood normal.        Behavior: Behavior normal.        Thought Content: Thought content normal.         Judgment: Judgment normal.     BP 127/66 (BP Location: Right Arm)   Pulse 83   Resp 16   Past Medical History:  Diagnosis Date  . Diabetes mellitus without complication (HCC)   . Hypertension   . Peripheral artery disease (HCC)     Social History   Socioeconomic History  . Marital status: Married    Spouse name: Burman Riis   . Number of children: 5  . Years of education: Not on file  . 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  Tobacco Use  . Smoking status: Current Every Day Smoker    Packs/day: 1.00    Years: 30.00    Pack years: 30.00    Types: Cigarettes  . Smokeless tobacco: Never Used  Vaping Use  . Vaping Use: Never used  Substance and Sexual Activity  . Alcohol use: Never  . Drug use: Not Currently  . Sexual activity: Not on file  Other Topics Concern  . Not on file  Social History Narrative  . Not on file   Social Determinants of Health   Financial Resource Strain:   . Difficulty of Paying Living Expenses:   Food Insecurity:   . Worried About Programme researcher, broadcasting/film/video in the Last  Year:   . Ran Out of Food in the Last Year:   Transportation Needs:   . Freight forwarder (Medical):   Marland Kitchen Lack of Transportation (Non-Medical):   Physical Activity:   . Days of Exercise per Week:   . Minutes of Exercise per Session:   Stress:   . Feeling of Stress :   Social Connections:   . Frequency of Communication with Friends and Family:   . Frequency of Social Gatherings with Friends and Family:   . Attends Religious Services:   . Active Member of Clubs or Organizations:   . Attends Banker Meetings:   Marland Kitchen Marital Status:   Intimate Partner Violence:   . Fear of Current or Ex-Partner:   . Emotionally Abused:   Marland Kitchen Physically Abused:   . Sexually Abused:     Past Surgical History:  Procedure Laterality Date  . ABDOMINAL HYSTERECTOMY     86 y.o. at time  .  CHOLECYSTECTOMY  1965  . JOINT REPLACEMENT  2016   Bilat. Hip Replacements   . LOWER EXTREMITY ANGIOGRAPHY Right 08/09/2018   Procedure: LOWER EXTREMITY ANGIOGRAPHY;  Surgeon: Renford Dills, MD;  Location: [facility] INVASIVE CV LAB;  Service: Cardiovascular;  Laterality: Right;  . LOWER EXTREMITY ANGIOGRAPHY Right 01/13/2019   Procedure: LOWER EXTREMITY ANGIOGRAPHY;  Surgeon: Renford Dills, MD;  Location: [facility] INVASIVE CV LAB;  Service: Cardiovascular;  Laterality: Right;    Family History  Problem Relation Age of Onset  . Congestive Heart Failure Mother   . Diabetes Father   . Diabetes Sister     Allergies  Allergen Reactions  . Atorvastatin Other (See Comments)    "Felt as if she would pass out"  . Simvastatin Other (See Comments)    "Felt as if she would pass out"        Assessment & Plan:   1. Atherosclerosis of native artery of both lower extremities with intermittent claudication (HCC) Recommend:  The patient has experienced increased symptoms and is now describing lifestyle limiting claudication and mild rest pain.  Based on some of the patient's description of symptoms I suspect she may have a component of iliac level disease.   Given the severity of the patient's lower extremity symptoms the patient should undergo angiography and intervention.  Risk and benefits were reviewed the patient.  Indications for the procedure were reviewed.  All questions were answered, the patient agrees to proceed.   The patient should continue walking and begin a more formal exercise program.  The patient should continue antiplatelet therapy and aggressive treatment of the lipid abnormalities  The patient will follow up with me after the angiogram.   2. Hypertension associated with type 2 diabetes mellitus (HCC) Continue antihypertensive medications as already ordered, these medications have been reviewed and there are no changes at this time.   3. Hyperlipidemia associated with  type 2 diabetes mellitus (HCC) Continue statin as ordered and reviewed, no changes at this time    Current Outpatient Medications on File Prior to Visit  Medication Sig Dispense Refill  . acetaminophen (TYLENOL) 500 MG tablet Take 1,000 mg by mouth every 6 (six) hours as needed for moderate pain.     Marland Kitchen aspirin EC 81 MG tablet Take 81 mg by mouth daily.     . benazepril-hydrochlorthiazide (LOTENSIN HCT) 20-12.5 MG tablet Take 1 tablet by mouth daily.    Marland Kitchen buPROPion (WELLBUTRIN XL) 150 MG 24 hr tablet Take 150 mg by mouth daily.    Marland Kitchen  Calcium Citrate-Vitamin D (CALCIUM CITRATE + D3 PO) Take 2 tablets by mouth daily with lunch.    . Cinnamon 500 MG TABS Take 500 mg by mouth daily.    . clopidogrel (PLAVIX) 75 MG tablet Take 1 tablet by mouth once daily with breakfast 90 tablet 0  . Multiple Vitamin (MULTIVITAMIN WITH MINERALS) TABS tablet Take 1 tablet by mouth daily. Complete Multivitamin    . NOVOLOG MIX 70/30 FLEXPEN (70-30) 100 UNIT/ML FlexPen Inject 35-55 Units into the skin See admin instructions. Inject 55 units subcutaneously in the morning and 35 in the evening  1  . Omega-3 Fatty Acids (FISH OIL) 1000 MG CAPS Take 1,000 mg by mouth 2 (two) times daily.    . pravastatin (PRAVACHOL) 20 MG tablet Take 20 mg by mouth every evening.     Marland Kitchen oxyCODONE (OXY IR/ROXICODONE) 5 MG immediate release tablet Take 1 tablet (5 mg total) by mouth every 6 (six) hours as needed for moderate pain or severe pain. (Patient not taking: Reported on 01/05/2019) 20 tablet 0   No current facility-administered medications on file prior to visit.    There are no Patient Instructions on file for this visit. No follow-ups on file.   Georgiana Spinner, NP

## 2020-02-06 ENCOUNTER — Other Ambulatory Visit (INDEPENDENT_AMBULATORY_CARE_PROVIDER_SITE_OTHER): Payer: Self-pay | Admitting: Nurse Practitioner

## 2020-02-07 ENCOUNTER — Other Ambulatory Visit (INDEPENDENT_AMBULATORY_CARE_PROVIDER_SITE_OTHER): Payer: Self-pay | Admitting: Nurse Practitioner

## 2020-02-09 ENCOUNTER — Other Ambulatory Visit
Admission: RE | Admit: 2020-02-09 | Discharge: 2020-02-09 | Disposition: A | Discharge status: Home / Self Care | Payer: Medicare Other | Source: Ambulatory Visit | Attending: Vascular Surgery | Admitting: Vascular Surgery

## 2020-02-09 ENCOUNTER — Other Ambulatory Visit: Payer: Self-pay

## 2020-02-09 DIAGNOSIS — Z20822 Contact with and (suspected) exposure to covid-19: Secondary | ICD-10-CM | POA: Diagnosis not present

## 2020-02-09 DIAGNOSIS — Z01812 Encounter for preprocedural laboratory examination: Secondary | ICD-10-CM | POA: Diagnosis present

## 2020-02-10 LAB — SARS CORONAVIRUS 2 (TAT 6-24 HRS): SARS Coronavirus 2: NEGATIVE

## 2020-02-12 ENCOUNTER — Other Ambulatory Visit (INDEPENDENT_AMBULATORY_CARE_PROVIDER_SITE_OTHER): Payer: Self-pay | Admitting: Nurse Practitioner

## 2020-02-13 ENCOUNTER — Other Ambulatory Visit: Payer: Self-pay

## 2020-02-13 ENCOUNTER — Emergency Department: Payer: Medicare Other

## 2020-02-13 ENCOUNTER — Inpatient Hospital Stay
Admission: EM | Admit: 2020-02-13 | Discharge: 2020-02-22 | DRG: 280 | Disposition: A | Discharge status: Hospice | Payer: Medicare Other | Attending: Student | Admitting: Student

## 2020-02-13 ENCOUNTER — Inpatient Hospital Stay (HOSPITAL_COMMUNITY)
Admit: 2020-02-13 | Discharge: 2020-02-13 | Disposition: A | Discharge status: Home / Self Care | Payer: Medicare Other | Attending: Family Medicine | Admitting: Family Medicine

## 2020-02-13 ENCOUNTER — Encounter
Admission: EM | Disposition: A | Discharge status: Hospice | Payer: Self-pay | Source: Home / Self Care | Attending: Internal Medicine

## 2020-02-13 ENCOUNTER — Ambulatory Visit: Admission: RE | Admit: 2020-02-13 | Payer: Medicare Other | Source: Ambulatory Visit | Admitting: Vascular Surgery

## 2020-02-13 DIAGNOSIS — E1165 Type 2 diabetes mellitus with hyperglycemia: Secondary | ICD-10-CM | POA: Diagnosis present

## 2020-02-13 DIAGNOSIS — I509 Heart failure, unspecified: Secondary | ICD-10-CM

## 2020-02-13 DIAGNOSIS — E1151 Type 2 diabetes mellitus with diabetic peripheral angiopathy without gangrene: Secondary | ICD-10-CM | POA: Diagnosis present

## 2020-02-13 DIAGNOSIS — D509 Iron deficiency anemia, unspecified: Secondary | ICD-10-CM | POA: Diagnosis present

## 2020-02-13 DIAGNOSIS — I70219 Atherosclerosis of native arteries of extremities with intermittent claudication, unspecified extremity: Secondary | ICD-10-CM

## 2020-02-13 DIAGNOSIS — I351 Nonrheumatic aortic (valve) insufficiency: Secondary | ICD-10-CM

## 2020-02-13 DIAGNOSIS — N183 Chronic kidney disease, stage 3 unspecified: Secondary | ICD-10-CM

## 2020-02-13 DIAGNOSIS — J9601 Acute respiratory failure with hypoxia: Secondary | ICD-10-CM | POA: Diagnosis present

## 2020-02-13 DIAGNOSIS — F329 Major depressive disorder, single episode, unspecified: Secondary | ICD-10-CM | POA: Diagnosis present

## 2020-02-13 DIAGNOSIS — K921 Melena: Secondary | ICD-10-CM | POA: Diagnosis not present

## 2020-02-13 DIAGNOSIS — F17210 Nicotine dependence, cigarettes, uncomplicated: Secondary | ICD-10-CM | POA: Diagnosis present

## 2020-02-13 DIAGNOSIS — I5031 Acute diastolic (congestive) heart failure: Secondary | ICD-10-CM | POA: Diagnosis not present

## 2020-02-13 DIAGNOSIS — I4510 Unspecified right bundle-branch block: Secondary | ICD-10-CM | POA: Diagnosis present

## 2020-02-13 DIAGNOSIS — Z515 Encounter for palliative care: Secondary | ICD-10-CM | POA: Diagnosis not present

## 2020-02-13 DIAGNOSIS — I214 Non-ST elevation (NSTEMI) myocardial infarction: Principal | ICD-10-CM | POA: Diagnosis present

## 2020-02-13 DIAGNOSIS — E871 Hypo-osmolality and hyponatremia: Secondary | ICD-10-CM | POA: Diagnosis present

## 2020-02-13 DIAGNOSIS — I70211 Atherosclerosis of native arteries of extremities with intermittent claudication, right leg: Secondary | ICD-10-CM | POA: Diagnosis present

## 2020-02-13 DIAGNOSIS — I10 Essential (primary) hypertension: Secondary | ICD-10-CM | POA: Diagnosis not present

## 2020-02-13 DIAGNOSIS — J810 Acute pulmonary edema: Secondary | ICD-10-CM

## 2020-02-13 DIAGNOSIS — I480 Paroxysmal atrial fibrillation: Secondary | ICD-10-CM | POA: Diagnosis not present

## 2020-02-13 DIAGNOSIS — R7989 Other specified abnormal findings of blood chemistry: Secondary | ICD-10-CM | POA: Diagnosis not present

## 2020-02-13 DIAGNOSIS — I5041 Acute combined systolic (congestive) and diastolic (congestive) heart failure: Secondary | ICD-10-CM | POA: Diagnosis not present

## 2020-02-13 DIAGNOSIS — Z66 Do not resuscitate: Secondary | ICD-10-CM | POA: Diagnosis not present

## 2020-02-13 DIAGNOSIS — R0902 Hypoxemia: Secondary | ICD-10-CM

## 2020-02-13 DIAGNOSIS — I083 Combined rheumatic disorders of mitral, aortic and tricuspid valves: Secondary | ICD-10-CM | POA: Diagnosis present

## 2020-02-13 DIAGNOSIS — E1122 Type 2 diabetes mellitus with diabetic chronic kidney disease: Secondary | ICD-10-CM | POA: Diagnosis present

## 2020-02-13 DIAGNOSIS — I130 Hypertensive heart and chronic kidney disease with heart failure and stage 1 through stage 4 chronic kidney disease, or unspecified chronic kidney disease: Secondary | ICD-10-CM | POA: Diagnosis present

## 2020-02-13 DIAGNOSIS — E669 Obesity, unspecified: Secondary | ICD-10-CM | POA: Diagnosis present

## 2020-02-13 DIAGNOSIS — R059 Cough, unspecified: Secondary | ICD-10-CM

## 2020-02-13 DIAGNOSIS — I4891 Unspecified atrial fibrillation: Secondary | ICD-10-CM | POA: Diagnosis present

## 2020-02-13 DIAGNOSIS — J441 Chronic obstructive pulmonary disease with (acute) exacerbation: Secondary | ICD-10-CM | POA: Diagnosis present

## 2020-02-13 DIAGNOSIS — Z7189 Other specified counseling: Secondary | ICD-10-CM

## 2020-02-13 DIAGNOSIS — I429 Cardiomyopathy, unspecified: Secondary | ICD-10-CM | POA: Diagnosis present

## 2020-02-13 DIAGNOSIS — I5043 Acute on chronic combined systolic (congestive) and diastolic (congestive) heart failure: Secondary | ICD-10-CM | POA: Diagnosis present

## 2020-02-13 DIAGNOSIS — Z9049 Acquired absence of other specified parts of digestive tract: Secondary | ICD-10-CM

## 2020-02-13 DIAGNOSIS — E785 Hyperlipidemia, unspecified: Secondary | ICD-10-CM | POA: Diagnosis present

## 2020-02-13 DIAGNOSIS — Z20822 Contact with and (suspected) exposure to covid-19: Secondary | ICD-10-CM | POA: Diagnosis present

## 2020-02-13 DIAGNOSIS — N1831 Chronic kidney disease, stage 3a: Secondary | ICD-10-CM | POA: Diagnosis present

## 2020-02-13 DIAGNOSIS — K219 Gastro-esophageal reflux disease without esophagitis: Secondary | ICD-10-CM | POA: Diagnosis present

## 2020-02-13 DIAGNOSIS — Z7902 Long term (current) use of antithrombotics/antiplatelets: Secondary | ICD-10-CM

## 2020-02-13 DIAGNOSIS — Z794 Long term (current) use of insulin: Secondary | ICD-10-CM

## 2020-02-13 DIAGNOSIS — N179 Acute kidney failure, unspecified: Secondary | ICD-10-CM | POA: Diagnosis not present

## 2020-02-13 DIAGNOSIS — I340 Nonrheumatic mitral (valve) insufficiency: Secondary | ICD-10-CM | POA: Diagnosis not present

## 2020-02-13 DIAGNOSIS — I350 Nonrheumatic aortic (valve) stenosis: Secondary | ICD-10-CM | POA: Diagnosis not present

## 2020-02-13 DIAGNOSIS — F419 Anxiety disorder, unspecified: Secondary | ICD-10-CM | POA: Diagnosis present

## 2020-02-13 DIAGNOSIS — Z90710 Acquired absence of both cervix and uterus: Secondary | ICD-10-CM

## 2020-02-13 DIAGNOSIS — Z79899 Other long term (current) drug therapy: Secondary | ICD-10-CM

## 2020-02-13 DIAGNOSIS — D649 Anemia, unspecified: Secondary | ICD-10-CM | POA: Diagnosis not present

## 2020-02-13 DIAGNOSIS — Z6827 Body mass index (BMI) 27.0-27.9, adult: Secondary | ICD-10-CM

## 2020-02-13 DIAGNOSIS — I700 Atherosclerosis of aorta: Secondary | ICD-10-CM | POA: Diagnosis present

## 2020-02-13 DIAGNOSIS — E1121 Type 2 diabetes mellitus with diabetic nephropathy: Secondary | ICD-10-CM | POA: Diagnosis not present

## 2020-02-13 DIAGNOSIS — E1159 Type 2 diabetes mellitus with other circulatory complications: Secondary | ICD-10-CM | POA: Diagnosis not present

## 2020-02-13 DIAGNOSIS — R0602 Shortness of breath: Secondary | ICD-10-CM | POA: Diagnosis not present

## 2020-02-13 DIAGNOSIS — Z96653 Presence of artificial knee joint, bilateral: Secondary | ICD-10-CM | POA: Diagnosis present

## 2020-02-13 DIAGNOSIS — I152 Hypertension secondary to endocrine disorders: Secondary | ICD-10-CM | POA: Diagnosis present

## 2020-02-13 DIAGNOSIS — R062 Wheezing: Secondary | ICD-10-CM | POA: Diagnosis not present

## 2020-02-13 DIAGNOSIS — Z8249 Family history of ischemic heart disease and other diseases of the circulatory system: Secondary | ICD-10-CM

## 2020-02-13 DIAGNOSIS — Z7982 Long term (current) use of aspirin: Secondary | ICD-10-CM

## 2020-02-13 DIAGNOSIS — R05 Cough: Secondary | ICD-10-CM

## 2020-02-13 DIAGNOSIS — Z833 Family history of diabetes mellitus: Secondary | ICD-10-CM

## 2020-02-13 LAB — GLUCOSE, CAPILLARY
Glucose-Capillary: 301 mg/dL — ABNORMAL HIGH (ref 70–99)
Glucose-Capillary: 251 mg/dL — ABNORMAL HIGH (ref 70–99)
Glucose-Capillary: 198 mg/dL — ABNORMAL HIGH (ref 70–99)
Glucose-Capillary: 209 mg/dL — ABNORMAL HIGH (ref 70–99)

## 2020-02-13 LAB — BLOOD GAS, VENOUS
Acid-Base Excess: 1.3 mmol/L (ref 0.0–2.0)
Bicarbonate: 25.9 mmol/L (ref 20.0–28.0)
Delivery systems: POSITIVE
FIO2: 0.5
O2 Saturation: 96.5 %
Patient temperature: 37
pCO2, Ven: 40 mmHg — ABNORMAL LOW (ref 44.0–60.0)
pH, Ven: 7.42 (ref 7.250–7.430)
pO2, Ven: 84 mmHg — ABNORMAL HIGH (ref 32.0–45.0)

## 2020-02-13 LAB — COMPREHENSIVE METABOLIC PANEL
ALT: 21 U/L (ref 0–44)
AST: 48 U/L — ABNORMAL HIGH (ref 15–41)
Albumin: 3.4 g/dL — ABNORMAL LOW (ref 3.5–5.0)
Alkaline Phosphatase: 91 U/L (ref 38–126)
Anion gap: 12 (ref 5–15)
BUN: 24 mg/dL — ABNORMAL HIGH (ref 8–23)
CO2: 27 mmol/L (ref 22–32)
Calcium: 9.9 mg/dL (ref 8.9–10.3)
Chloride: 91 mmol/L — ABNORMAL LOW (ref 98–111)
Creatinine, Ser: 1.19 mg/dL — ABNORMAL HIGH (ref 0.44–1.00)
GFR calc Af Amer: 47 mL/min — ABNORMAL LOW (ref >60)
GFR calc non Af Amer: 41 mL/min — ABNORMAL LOW (ref >60)
Glucose, Bld: 173 mg/dL — ABNORMAL HIGH (ref 70–99)
Potassium: 4.2 mmol/L (ref 3.5–5.1)
Sodium: 130 mmol/L — ABNORMAL LOW (ref 135–145)
Total Bilirubin: 0.5 mg/dL (ref 0.3–1.2)
Total Protein: 7.8 g/dL (ref 6.5–8.1)

## 2020-02-13 LAB — CBC WITH DIFFERENTIAL/PLATELET
Abs Immature Granulocytes: 0.07 10*3/uL (ref 0.00–0.07)
Basophils Absolute: 0.1 10*3/uL (ref 0.0–0.1)
Basophils Relative: 1 %
Eosinophils Absolute: 0.3 10*3/uL (ref 0.0–0.5)
Eosinophils Relative: 2 %
HCT: 26.1 % — ABNORMAL LOW (ref 36.0–46.0)
Hemoglobin: 8 g/dL — ABNORMAL LOW (ref 12.0–15.0)
Immature Granulocytes: 1 %
Lymphocytes Relative: 26 %
Lymphs Abs: 3.7 10*3/uL (ref 0.7–4.0)
MCH: 24.4 pg — ABNORMAL LOW (ref 26.0–34.0)
MCHC: 30.7 g/dL (ref 30.0–36.0)
MCV: 79.6 fL — ABNORMAL LOW (ref 80.0–100.0)
Monocytes Absolute: 1.5 10*3/uL — ABNORMAL HIGH (ref 0.1–1.0)
Monocytes Relative: 10 %
Neutro Abs: 8.9 10*3/uL — ABNORMAL HIGH (ref 1.7–7.7)
Neutrophils Relative %: 60 %
Platelets: 469 10*3/uL — ABNORMAL HIGH (ref 150–400)
RBC: 3.28 MIL/uL — ABNORMAL LOW (ref 3.87–5.11)
RDW: 15.7 % — ABNORMAL HIGH (ref 11.5–15.5)
WBC: 14.5 10*3/uL — ABNORMAL HIGH (ref 4.0–10.5)
nRBC: 0 % (ref 0.0–0.2)

## 2020-02-13 LAB — MAGNESIUM
Magnesium: 2.8 mg/dL — ABNORMAL HIGH (ref 1.7–2.4)
Magnesium: 2.1 mg/dL (ref 1.7–2.4)

## 2020-02-13 LAB — PROTIME-INR
INR: 1 (ref 0.8–1.2)
Prothrombin Time: 12.9 seconds (ref 11.4–15.2)

## 2020-02-13 LAB — ECHOCARDIOGRAM COMPLETE
AR max vel: 0.69 cm2
AV Area VTI: 0.65 cm2
AV Area mean vel: 0.6 cm2
AV Mean grad: 40 mmHg
AV Peak grad: 63.4 mmHg
Ao pk vel: 3.98 m/s
Area-P 1/2: 3.3 cm2
Calc EF: 48.5 %
Height: 62 in
S' Lateral: 2.47 cm
Single Plane A2C EF: 50.1 %
Single Plane A4C EF: 47.8 %
Weight: 2384.5 oz

## 2020-02-13 LAB — TSH: TSH: 0.763 u[IU]/mL (ref 0.350–4.500)

## 2020-02-13 LAB — SARS CORONAVIRUS 2 BY RT PCR (HOSPITAL ORDER, PERFORMED IN ~~LOC~~ HOSPITAL LAB): SARS Coronavirus 2: NEGATIVE

## 2020-02-13 LAB — APTT: aPTT: 35 seconds (ref 24–36)

## 2020-02-13 LAB — TROPONIN I (HIGH SENSITIVITY)
Troponin I (High Sensitivity): 2180 ng/L (ref <18)
Troponin I (High Sensitivity): 1848 ng/L (ref <18)

## 2020-02-13 LAB — PROCALCITONIN: Procalcitonin: <0.1 ng/mL

## 2020-02-13 LAB — HEPARIN LEVEL (UNFRACTIONATED)
Heparin Unfractionated: 0.37 IU/mL (ref 0.30–0.70)
Heparin Unfractionated: 0.25 IU/mL — ABNORMAL LOW (ref 0.30–0.70)

## 2020-02-13 LAB — BRAIN NATRIURETIC PEPTIDE
B Natriuretic Peptide: 1085.6 pg/mL — ABNORMAL HIGH (ref 0.0–100.0)
B Natriuretic Peptide: 1492.8 pg/mL — ABNORMAL HIGH (ref 0.0–100.0)

## 2020-02-13 SURGERY — LOWER EXTREMITY ANGIOGRAPHY
Anesthesia: Moderate Sedation | Site: Leg Lower | Laterality: Left

## 2020-02-13 MED ORDER — CALCIUM CARBONATE-VITAMIN D 500-200 MG-UNIT PO TABS
1.0000 | ORAL_TABLET | Freq: Every day | ORAL | Status: DC
  Administered 2020-02-14 – 2020-02-21 (×8): 1 via ORAL
  Filled 2020-02-13 (×9): qty 1

## 2020-02-13 MED ORDER — OMEGA-3-ACID ETHYL ESTERS 1 G PO CAPS
1.0000 g | ORAL_CAPSULE | Freq: Every day | ORAL | Status: DC
  Administered 2020-02-13 – 2020-02-22 (×10): 1 g via ORAL
  Filled 2020-02-13 (×9): qty 1

## 2020-02-13 MED ORDER — CINNAMON 500 MG PO TABS: 500.0000 mg | ORAL_TABLET | Freq: Every day | ORAL | Status: DC

## 2020-02-13 MED ORDER — BUPROPION HCL ER (XL) 150 MG PO TB24
150.0000 mg | ORAL_TABLET | Freq: Every day | ORAL | Status: DC
  Administered 2020-02-13 – 2020-02-22 (×10): 150 mg via ORAL
  Filled 2020-02-13 (×10): qty 1

## 2020-02-13 MED ORDER — ONDANSETRON HCL 4 MG/2ML IJ SOLN
INTRAMUSCULAR | Status: AC
  Administered 2020-02-13: 4 mg
  Filled 2020-02-13: qty 2

## 2020-02-13 MED ORDER — FUROSEMIDE 10 MG/ML IJ SOLN
20.0000 mg | Freq: Once | INTRAMUSCULAR | Status: AC
  Administered 2020-02-13: 20 mg via INTRAVENOUS
  Filled 2020-02-13: qty 4

## 2020-02-13 MED ORDER — BENAZEPRIL-HYDROCHLOROTHIAZIDE 20-12.5 MG PO TABS: 1.0000 | ORAL_TABLET | Freq: Every day | ORAL | Status: DC

## 2020-02-13 MED ORDER — SODIUM CHLORIDE 0.9% FLUSH
3.0000 mL | Freq: Two times a day (BID) | INTRAVENOUS | Status: DC
  Administered 2020-02-13 – 2020-02-20 (×14): 3 mL via INTRAVENOUS

## 2020-02-13 MED ORDER — PERFLUTREN LIPID MICROSPHERE
1.0000 mL | INTRAVENOUS | Status: AC | PRN
  Administered 2020-02-13: 2 mL via INTRAVENOUS
  Filled 2020-02-13: qty 10

## 2020-02-13 MED ORDER — BENAZEPRIL HCL 20 MG PO TABS
20.0000 mg | ORAL_TABLET | Freq: Every day | ORAL | Status: DC
  Administered 2020-02-13 – 2020-02-14 (×2): 20 mg via ORAL
  Filled 2020-02-13 (×3): qty 1

## 2020-02-13 MED ORDER — SODIUM CHLORIDE 0.9 % IV SOLN
250.0000 mL | INTRAVENOUS | Status: DC | PRN
  Administered 2020-02-21: 250 mL via INTRAVENOUS

## 2020-02-13 MED ORDER — INSULIN ASPART PROT & ASPART (70-30 MIX) 100 UNIT/ML PEN: 35.0000 [IU] | PEN_INJECTOR | SUBCUTANEOUS | Status: DC

## 2020-02-13 MED ORDER — PRAVASTATIN SODIUM 40 MG PO TABS
40.0000 mg | ORAL_TABLET | Freq: Every day | ORAL | Status: DC
  Administered 2020-02-13 – 2020-02-21 (×9): 40 mg via ORAL
  Filled 2020-02-13 (×10): qty 1

## 2020-02-13 MED ORDER — INSULIN ASPART PROT & ASPART (70-30 MIX) 100 UNIT/ML ~~LOC~~ SUSP
55.0000 [IU] | Freq: Every day | SUBCUTANEOUS | Status: DC
  Administered 2020-02-13: 55 [IU] via SUBCUTANEOUS
  Filled 2020-02-13 (×3): qty 10

## 2020-02-13 MED ORDER — ONDANSETRON HCL 4 MG/2ML IJ SOLN
4.0000 mg | Freq: Four times a day (QID) | INTRAMUSCULAR | Status: DC | PRN
  Filled 2020-02-13: qty 2

## 2020-02-13 MED ORDER — ZOLPIDEM TARTRATE 5 MG PO TABS
5.0000 mg | ORAL_TABLET | Freq: Every evening | ORAL | Status: DC | PRN
  Administered 2020-02-20: 5 mg via ORAL
  Filled 2020-02-13: qty 1

## 2020-02-13 MED ORDER — ASPIRIN EC 81 MG PO TBEC
81.0000 mg | DELAYED_RELEASE_TABLET | Freq: Every day | ORAL | Status: DC
  Administered 2020-02-13 – 2020-02-22 (×10): 81 mg via ORAL
  Filled 2020-02-13 (×10): qty 1

## 2020-02-13 MED ORDER — ALPRAZOLAM 0.25 MG PO TABS
0.2500 mg | ORAL_TABLET | Freq: Two times a day (BID) | ORAL | Status: DC | PRN
  Administered 2020-02-15 – 2020-02-21 (×7): 0.25 mg via ORAL
  Filled 2020-02-13 (×8): qty 1

## 2020-02-13 MED ORDER — FUROSEMIDE 10 MG/ML IJ SOLN
40.0000 mg | Freq: Two times a day (BID) | INTRAMUSCULAR | Status: DC
  Administered 2020-02-13 – 2020-02-16 (×7): 40 mg via INTRAVENOUS
  Filled 2020-02-13 (×7): qty 4

## 2020-02-13 MED ORDER — INSULIN ASPART PROT & ASPART (70-30 MIX) 100 UNIT/ML ~~LOC~~ SUSP
35.0000 [IU] | Freq: Every day | SUBCUTANEOUS | Status: DC
  Administered 2020-02-13: 35 [IU] via SUBCUTANEOUS
  Filled 2020-02-13: qty 10

## 2020-02-13 MED ORDER — ASPIRIN 81 MG PO CHEW
324.0000 mg | CHEWABLE_TABLET | Freq: Once | ORAL | Status: AC
  Administered 2020-02-13: 324 mg via ORAL
  Filled 2020-02-13: qty 4

## 2020-02-13 MED ORDER — CLOPIDOGREL BISULFATE 75 MG PO TABS
75.0000 mg | ORAL_TABLET | Freq: Every day | ORAL | Status: DC
  Administered 2020-02-13 – 2020-02-22 (×10): 75 mg via ORAL
  Filled 2020-02-13 (×10): qty 1

## 2020-02-13 MED ORDER — ACETAMINOPHEN 325 MG PO TABS
650.0000 mg | ORAL_TABLET | ORAL | Status: DC | PRN
  Administered 2020-02-15: 650 mg via ORAL
  Filled 2020-02-13: qty 2

## 2020-02-13 MED ORDER — HEPARIN BOLUS VIA INFUSION
4000.0000 [IU] | Freq: Once | INTRAVENOUS | Status: AC
  Administered 2020-02-13: 4000 [IU] via INTRAVENOUS
  Filled 2020-02-13: qty 4000

## 2020-02-13 MED ORDER — HEPARIN (PORCINE) 25000 UT/250ML-% IV SOLN
900.0000 [IU]/h | INTRAVENOUS | Status: DC
  Administered 2020-02-13: 800 [IU]/h via INTRAVENOUS
  Administered 2020-02-14 – 2020-02-17 (×3): 900 [IU]/h via INTRAVENOUS
  Filled 2020-02-13 (×5): qty 250

## 2020-02-13 MED ORDER — ADULT MULTIVITAMIN W/MINERALS CH
1.0000 | ORAL_TABLET | Freq: Every day | ORAL | Status: DC
  Administered 2020-02-13 – 2020-02-22 (×10): 1 via ORAL
  Filled 2020-02-13 (×10): qty 1

## 2020-02-13 MED ORDER — SODIUM CHLORIDE 0.9% FLUSH: 3.0000 mL | INTRAVENOUS | Status: DC | PRN

## 2020-02-13 MED ORDER — GUAIFENESIN-DM 100-10 MG/5ML PO SYRP
5.0000 mL | ORAL_SOLUTION | ORAL | Status: DC | PRN
  Administered 2020-02-13 – 2020-02-15 (×6): 5 mL via ORAL
  Filled 2020-02-13 (×6): qty 5

## 2020-02-13 MED ORDER — HYDROCHLOROTHIAZIDE 12.5 MG PO CAPS
12.5000 mg | ORAL_CAPSULE | Freq: Every day | ORAL | Status: DC
  Administered 2020-02-13 – 2020-02-14 (×2): 12.5 mg via ORAL
  Filled 2020-02-13 (×2): qty 1

## 2020-02-13 NOTE — Progress Notes (Signed)
PHARMACIST - PHYSICIAN ORDER COMMUNICATION  CONCERNING: P&T Medication Policy on Herbal Medications  DESCRIPTION:  This patient's order for:  Cinnamon  has been noted.  This product(s) is classified as an "herbal" or natural product. Due to a lack of definitive safety studies or FDA approval, nonstandard manufacturing practices, plus the potential risk of unknown drug-drug interactions while on inpatient medications, the Pharmacy and Therapeutics Committee does not permit the use of "herbal" or natural products of this type within [facility].   ACTION TAKEN: The pharmacy department is unable to verify this order at this time. Please reevaluate patient's clinical condition at discharge and address if the herbal or natural product(s) should be resumed at that time.  [facility], RPh 02/13/2020 8:20 AM

## 2020-02-13 NOTE — ED Provider Notes (Signed)
[facility] Emergency Department Provider Note  ____________________________________________   First MD Initiated Contact with Patient 02/13/20 0139     (approximate)  I have reviewed the triage vital signs and the nursing notes.   HISTORY  Chief Complaint Respiratory Distress  Level 5 caveat:  history/ROS limited by acute/critical illness  HPI Hannah Conway is a 86 y.o. female with medical history as listed below which includes COPD per verbal report but not indicated in her medical record.  She presents via EMS for evaluation of acute and severe dyspnea.  The symptoms have been gradually worsening over the last 4 days.  Her oxygen level was in the low 80s on room air with no baseline supplemental oxygen requirement.  She was treated by EMS for COPD with Solu-Medrol 125 mg IV, magnesium 2 g IV, and breathing treatments and CPAP.  She is feeling better on the CPAP.  She denies pain.  She denies recent fever, sore throat, chest pain, nausea, vomiting, and abdominal pain.  She is able to speak in complete sentences now on the CPAP.  She has received both doses of COVID-19 vaccination.  Her symptoms were severe but now they are moderate.   She reports that she smoked until a week ago.        Past Medical History:  Diagnosis Date  . Diabetes mellitus without complication (HCC)   . Hypertension   . Peripheral artery disease [facility])     Patient Active Problem List   Diagnosis Date Noted  . Vitamin D deficiency 12/26/2018  . Atherosclerosis of native arteries of extremity with rest pain (HCC) 08/24/2018  . Hematoma of groin 08/09/2018  . Atherosclerosis of native arteries of extremity with intermittent claudication (HCC) 07/25/2018  . Claudication of calf muscles (HCC) 02/03/2018  . [place] retinal degeneration, bilateral 09/10/2017  . Age-related nuclear cataract of both eyes 09/10/2017  . Chronic diarrhea 12/23/2016  . Bilateral nonexudative age-related  macular degeneration 12/23/2016  . Aortic atherosclerosis (HCC) 06/18/2016  . High risk medication use 06/18/2014  . Edema 01/17/2014  . GERD (gastroesophageal reflux disease) 12/14/2013  . Hyperlipidemia associated with type 2 diabetes mellitus (HCC) 12/14/2013  . Hypertension associated with type 2 diabetes mellitus (HCC) 12/14/2013  . Type 2 diabetes mellitus with stage 3 chronic kidney disease, with long-term current use of insulin (HCC) 12/14/2013  . Aortic stenosis 12/14/2013    Past Surgical History:  Procedure Laterality Date  . ABDOMINAL HYSTERECTOMY     86 y.o. at time  . CHOLECYSTECTOMY  1965  . JOINT REPLACEMENT  2016   Bilat. Hip Replacements   . LOWER EXTREMITY ANGIOGRAPHY Right 08/09/2018   Procedure: LOWER EXTREMITY ANGIOGRAPHY;  Surgeon: Renford Dills, MD;  Location: [facility] INVASIVE CV LAB;  Service: Cardiovascular;  Laterality: Right;  . LOWER EXTREMITY ANGIOGRAPHY Right 01/13/2019   Procedure: LOWER EXTREMITY ANGIOGRAPHY;  Surgeon: Renford Dills, MD;  Location: [facility] INVASIVE CV LAB;  Service: Cardiovascular;  Laterality: Right;    Prior to Admission medications   Medication Sig Start Date End Date Taking? Authorizing Provider  acetaminophen (TYLENOL) 500 MG tablet Take 1,000 mg by mouth every 6 (six) hours as needed for moderate pain.     [provider]  aspirin EC 81 MG tablet Take 81 mg by mouth daily.     [provider]  benazepril-hydrochlorthiazide (LOTENSIN HCT) 20-12.5 MG tablet Take 1 tablet by mouth daily. 11/27/17   [provider]  buPROPion (WELLBUTRIN XL) 150 MG 24  hr tablet Take 150 mg by mouth daily.    [provider]  Calcium Citrate-Vitamin D (CALCIUM CITRATE + D3 PO) Take 2 tablets by mouth daily with lunch.    [provider]  Cinnamon 500 MG TABS Take 500 mg by mouth daily.    [provider]  clopidogrel (PLAVIX) 75 MG tablet Take 1 tablet by mouth once daily with breakfast Patient  taking differently: Take 75 mg by mouth daily.  02/07/20   Georgiana Spinner, NP  Multiple Vitamin (MULTIVITAMIN WITH MINERALS) TABS tablet Take 1 tablet by mouth daily. Complete Multivitamin    [provider]  NOVOLOG MIX 70/30 FLEXPEN (70-30) 100 UNIT/ML FlexPen Inject 35-55 Units into the skin See admin instructions. Inject 55 units subcutaneously in the morning and 35 in the evening 02/01/18   [provider]  Omega-3 Fatty Acids (FISH OIL) 1000 MG CAPS Take 1,000 mg by mouth 2 (two) times daily.    [provider]  oxyCODONE (OXY IR/ROXICODONE) 5 MG immediate release tablet Take 1 tablet (5 mg total) by mouth every 6 (six) hours as needed for moderate pain or severe pain. Patient not taking: Reported on 01/05/2019 08/10/18   Annice Needy, MD  pravastatin (PRAVACHOL) 40 MG tablet Take 40 mg by mouth daily. 01/23/20   [provider]    Allergies Atorvastatin and Simvastatin  Family History  Problem Relation Age of Onset  . Congestive Heart Failure Mother   . Diabetes Father   . Diabetes Sister     Social History Social History   Tobacco Use  . Smoking status: Current Every Day Smoker    Packs/day: 1.00    Years: 30.00    Pack years: 30.00    Types: Cigarettes  . Smokeless tobacco: Never Used  Vaping Use  . Vaping Use: Never used  Substance Use Topics  . Alcohol use: Never  . Drug use: Not Currently    Review of Systems Level 5 caveat:  history/ROS limited by acute/critical illness  Constitutional: No fever/chills Eyes: No visual changes. ENT: No sore throat. Cardiovascular: Denies chest pain. Respiratory: +shortness of breath. Gastrointestinal: No abdominal pain.  No nausea, no vomiting.  No diarrhea.  No constipation. Genitourinary: Negative for dysuria. Musculoskeletal: Negative for neck pain.  Negative for back pain. Integumentary: Negative for rash. Neurological: Negative for headaches, focal weakness or  numbness.   ____________________________________________   PHYSICAL EXAM:  VITAL SIGNS: ED Triage Vitals  Enc Vitals Group     BP 02/13/20 0158 (!) 149/53     Pulse Rate 02/13/20 0158 83     Resp 02/13/20 0158 19     Temp 02/13/20 0158 (!) 97.2 F (36.2 C)     Temp Source 02/13/20 0158 Axillary     SpO2 02/13/20 0141 100 %     Weight 02/13/20 0141 67.6 kg (149 lb 0.5 oz)     Height 02/13/20 0141 1.575 m (5\' 2" )     Head Circumference --      Peak Flow --      Pain Score 02/13/20 0146 0     Pain Loc --      Pain Edu? --      Excl. in GC? --     Constitutional: Alert and oriented.  Eyes: Conjunctivae are normal.  Head: Atraumatic. Nose: No congestion/rhinnorhea. Mouth/Throat: Patient is wearing a mask. Neck: No stridor.  No meningeal signs.   Cardiovascular: Normal rate, regular rhythm. Good peripheral circulation. Grossly normal heart  sounds. Respiratory: Increased respiratory effort with minimal intercostal muscle usage and retractions on CPAP.  Coarse breath sounds throughout all lung fields. Gastrointestinal: Soft and nontender. No distention.  Musculoskeletal: No lower extremity tenderness nor edema. No gross deformities of extremities. Neurologic:  Normal speech and language. No gross focal neurologic deficits are appreciated.  Skin:  Skin is warm, dry and intact. Psychiatric: Mood and affect are normal. Speech and behavior are normal.  ____________________________________________   LABS (all labs ordered are listed, but only abnormal results are displayed)  Labs Reviewed  BLOOD GAS, VENOUS - Abnormal; Notable for the following components:      Result Value   pCO2, Ven 40 (*)    pO2, Ven 84.0 (*)    All other components within normal limits  BRAIN NATRIURETIC PEPTIDE - Abnormal; Notable for the following components:   B Natriuretic Peptide 1,085.6 (*)    All other components within normal limits  CBC WITH DIFFERENTIAL/PLATELET - Abnormal; Notable for the  following components:   WBC 14.5 (*)    RBC 3.28 (*)    Hemoglobin 8.0 (*)    HCT 26.1 (*)    MCV 79.6 (*)    MCH 24.4 (*)    RDW 15.7 (*)    Platelets 469 (*)    Neutro Abs 8.9 (*)    Monocytes Absolute 1.5 (*)    All other components within normal limits  COMPREHENSIVE METABOLIC PANEL - Abnormal; Notable for the following components:   Sodium 130 (*)    Chloride 91 (*)    Glucose, Bld 173 (*)    BUN 24 (*)    Creatinine, Ser 1.19 (*)    Albumin 3.4 (*)    AST 48 (*)    GFR calc non Af Amer 41 (*)    GFR calc Af Amer 47 (*)    All other components within normal limits  MAGNESIUM - Abnormal; Notable for the following components:   Magnesium 2.8 (*)    All other components within normal limits  TROPONIN I (HIGH SENSITIVITY) - Abnormal; Notable for the following components:   Troponin I (High Sensitivity) 2,180 (*)    All other components within normal limits  SARS CORONAVIRUS 2 BY RT PCR (HOSPITAL ORDER, PERFORMED IN  HOSPITAL LAB)  PROTIME-INR  PROCALCITONIN  APTT   ____________________________________________  EKG  ED ECG REPORT I, Loleta Rose, the attending physician, personally viewed and interpreted this ECG.  Date: 02/13/2020 EKG Time: 1:39 AM Rate: 85 Rhythm: Sinus rhythm with atrial premature complexes and incomplete right bundle branch block QRS Axis: normal Intervals: Slightly prolonged QTC at 503 ms ST/T Wave abnormalities: ST segment depression in leads V4, V5, and V6, likely demand ischemia in the setting of respiratory failure Narrative Interpretation: Probable demand ischemia, does not meet criteria for STEMI.   ____________________________________________  RADIOLOGY I, Loleta Rose, personally viewed and evaluated these images (plain radiographs) as part of my medical decision making, as well as reviewing the written report by the radiologist.  ED MD interpretation: Diffuse interstitial opacity, possible pulmonary edema.  Official  radiology report(s): DG Chest Portable 1 View  Result Date: 02/13/2020 CLINICAL DATA:  Dyspnea EXAM: PORTABLE CHEST 1 VIEW COMPARISON:  04/28/2013 FINDINGS: Diffuse interstitial opacity, worst in the lower lobes. No pneumothorax or sizable pleural effusion. IMPRESSION: Diffuse interstitial opacity, worst in the lower lobes, likely indicating pulmonary edema. Electronically Signed   By: Deatra Robinson M.D.   On: 02/13/2020 02:09    ____________________________________________   PROCEDURES  Procedure(s) performed (including Critical Care):  .Critical Care Performed by: Zaniyah Wernette, MD Authorized by: Loleta RoseForbach, Avelino Herren, MD   CritLoleta Roseical care provider statement:    Critical care time (minutes):  45   Critical care time was exclusive of:  Separately billable procedures and treating other patients   Critical care was necessary to treat or prevent imminent or life-threatening deterioration of the following conditions:  Respiratory failure and cardiac failure   Critical care was time spent personally by me on the following activities:  Development of treatment plan with patient or surrogate, discussions with consultants, evaluation of patient's response to treatment, examination of patient, obtaining history from patient or surrogate, ordering and performing treatments and interventions, ordering and review of laboratory studies, ordering and review of radiographic studies, pulse oximetry, re-evaluation of patient's condition and review of old charts .1-3 Lead EKG Interpretation Performed by: Loleta RoseForbach, Khrystyne Arpin, MD Authorized by: Loleta RoseForbach, Altheria Shadoan, MD     Interpretation: normal     ECG rate:  83   ECG rate assessment: normal     Rhythm: sinus rhythm     Ectopy: PVCs     Conduction: normal       ____________________________________________   INITIAL IMPRESSION / MDM / ASSESSMENT AND PLAN / ED COURSE  As part of my medical decision making, I reviewed the following data within the electronic medical  record:  Nursing notes reviewed and incorporated, Labs reviewed , EKG interpreted , Old chart reviewed, Radiograph reviewed , Discussed with admitting physician (Dr. Arville CareMansy) and reviewed Notes from prior ED visits   Differential diagnosis includes, but is not limited to, COPD exacerbation, heart failure (new onset or exacerbation), acute infection including pneumonia or UTI, PE, ACS.  The patient is on the cardiac monitor to evaluate for evidence of arrhythmia and/or significant heart rate changes.  Starting patient on BiPAP.  EKG is suggestive of ischemia but the patient has no chest pain and I suspect this is demand ischemia in the setting of respiratory failure.  Vital signs are generally reassuring.  Patient is feeling better and positive pressure ventilation.  Blood gas and standard lab work and chest x-ray are pending.  Patient received COPD treatment prior to arrival and I will hold off on additional treatment (such as nebulizer) for now.  COVID-19 PCR test is pending.     Clinical Course as of Feb 13 307  Tue Feb 13, 2020  0201 Generally reassuring blood gas with actually slightly decreased PCO2 rather than demonstrating retention and hypercapnia.  Blood gas, venous(!) [CF]  0231 WBC(!): 14.5 [CF]  0246 Highly concerning for NSTEMI rather than just demand ischemia.  Starting heparin bolus + infusion, giving ASA 324 mg PO, diuresis with furosemide 20 mg IV given appearance of CXR.  Consulting hospitalist for admission.  Troponin I (High Sensitivity)(!!): 2,180 [CF]  0248 suggestive of new onset heart failure in the setting of NSTEMI, ordering furosemide 20 mg IV as per previous note  B Natriuretic Peptide(!): 1,085.6 [CF]  0308 Discussed case by phone with Dr. Arville CareMansy who will admit.   [CF]    Clinical Course User Index [CF] Loleta RoseForbach, Garen Woolbright, MD     ____________________________________________  FINAL CLINICAL IMPRESSION(S) / ED DIAGNOSES  Final diagnoses:  NSTEMI (non-ST elevated  myocardial infarction) (HCC)  New onset of congestive heart failure (HCC)  Acute pulmonary edema (HCC)  Acute respiratory failure with hypoxemia (HCC)     MEDICATIONS GIVEN DURING THIS VISIT:  Medications  heparin bolus via infusion 4,000 Units (has no  administration in time range)    Followed by  heparin ADULT infusion 100 units/mL (25000 units/229mL sodium chloride 0.45%) (has no administration in time range)  aspirin chewable tablet 324 mg (324 mg Oral Given 02/13/20 0259)  furosemide (LASIX) injection 20 mg (20 mg Intravenous Given 02/13/20 0256)  ondansetron (ZOFRAN) 4 MG/2ML injection (4 mg  Given 02/13/20 0259)     ED Discharge Orders    None      *Please note:  Hannah Conway was evaluated in Emergency Department on 02/13/2020 for the symptoms described in the history of present illness. She was evaluated in the context of the global COVID-19 pandemic, which necessitated consideration that the patient might be at risk for infection with the SARS-CoV-2 virus that causes COVID-19. Institutional protocols and algorithms that pertain to the evaluation of patients at risk for COVID-19 are in a state of rapid change based on information released by regulatory bodies including the CDC and federal and state organizations. These policies and algorithms were followed during the patient's care in the ED.  Some ED evaluations and interventions may be delayed as a result of limited staffing during and after the pandemic.*  Note:  This document was prepared using Dragon voice recognition software and may include unintentional dictation errors.   Loleta Rose, MD 02/13/20 [phone]

## 2020-02-13 NOTE — Progress Notes (Signed)
ANTICOAGULATION CONSULT NOTE  Pharmacy Consult for Heparin Indication: chest pain/ACS  Allergies  Allergen Reactions   Atorvastatin Other (See Comments)    "Felt as if she would pass out"   Simvastatin Other (See Comments)    "Felt as if she would pass out"     Patient Measurements: Height: 5\' 2"  (157.5 cm) Weight: 71.8 kg (158 lb 4.8 oz) IBW/kg (Calculated) : 50.1 HEPARIN DW (KG): 65.4  Vital Signs: Temp: 98.5 F (36.9 C) (08/17 2012) Temp Source: Oral (08/17 2012) BP: 96/64 (08/17 2012) Pulse Rate: 87 (08/17 2012)  Labs: Recent Labs    02/13/20 0137 02/13/20 0434 02/13/20 1241 02/13/20 2051  HGB 8.0*  --   --   --   HCT 26.1*  --   --   --   PLT 469*  --   --   --   APTT 35  --   --   --   LABPROT 12.9  --   --   --   INR 1.0  --   --   --   HEPARINUNFRC  --   --  0.37 0.25*  CREATININE 1.19*  --   --   --   TROPONINIHS 2,180* 1,848*  --   --     Estimated Creatinine Clearance: 30.3 mL/min (A) (by C-G formula based on SCr of 1.19 mg/dL (H)).  Medical History: Past Medical History:  Diagnosis Date   Diabetes mellitus without complication (HCC)    Hypertension    Peripheral artery disease (HCC)     Medications:  Medications Prior to Admission  Medication Sig Dispense Refill Last Dose   acetaminophen (TYLENOL) 500 MG tablet Take 1,000 mg by mouth every 6 (six) hours as needed for moderate pain.    02/12/2020 at 2000   aspirin EC 81 MG tablet Take 81 mg by mouth daily.    02/12/2020 at 1000   benazepril-hydrochlorthiazide (LOTENSIN HCT) 20-12.5 MG tablet Take 1 tablet by mouth daily.   02/12/2020 at 1000   buPROPion (WELLBUTRIN XL) 150 MG 24 hr tablet Take 150 mg by mouth daily.   02/12/2020 at 1000   Calcium Citrate-Vitamin D (CALCIUM CITRATE + D3 PO) Take 2 tablets by mouth daily with lunch.   02/12/2020 at 1800   Cinnamon 500 MG TABS Take 500 mg by mouth daily.   02/12/2020 at 1000   clopidogrel (PLAVIX) 75 MG tablet Take 1 tablet by mouth once  daily with breakfast (Patient taking differently: Take 75 mg by mouth daily. ) 90 tablet 0 02/12/2020 at 1000   Multiple Vitamin (MULTIVITAMIN WITH MINERALS) TABS tablet Take 1 tablet by mouth daily. Complete Multivitamin   02/12/2020 at 1000   NOVOLOG MIX 70/30 FLEXPEN (70-30) 100 UNIT/ML FlexPen Inject 35-55 Units into the skin See admin instructions. Inject 55 units subcutaneously in the morning and 35 in the evening  1 02/12/2020 at 1800   Omega-3 Fatty Acids (FISH OIL) 1000 MG CAPS Take 1,000 mg by mouth 2 (two) times daily.   02/12/2020 at 2000   pravastatin (PRAVACHOL) 40 MG tablet Take 40 mg by mouth daily.   02/12/2020 at 2000   oxyCODONE (OXY IR/ROXICODONE) 5 MG immediate release tablet Take 1 tablet (5 mg total) by mouth every 6 (six) hours as needed for moderate pain or severe pain. (Patient not taking: Reported on 01/05/2019) 20 tablet 0     Assessment: Heparin initiated for ACS/STEMI.  No anticoagulants PTA per med list. Baseline CBC with Hgb of 8  which appears c/w patient's baseline. Baseline aPTT, PT-INR WNL.   Goal of Therapy:  Heparin level 0.3-0.7 units/ml Monitor platelets by anticoagulation protocol: Yes   Plan:  --8/17 at 2211 HL = 0.25, subtherapeutic. Will increase heparin infusion to 900 units/hr. Notified nursing of changes.  --Re-check HL in 6 hours  --Daily CBC per protocol  Erma Raiche R Melonie Germani 02/13/2020,10:21 PM

## 2020-02-13 NOTE — Telephone Encounter (Signed)
  A  call came in from the daughter stating that the patient had a heart attack and is in the ED, she stated she will call back when the patient is ready to be rescheduled for her angio. Patient was canceled off the schedule for now.

## 2020-02-13 NOTE — Progress Notes (Signed)
*  PRELIMINARY RESULTS* Echocardiogram 2D Echocardiogram has been performed.  Cristela Blue 02/13/2020, 9:57 AM

## 2020-02-13 NOTE — ED Notes (Signed)
Blood culture x2, rainbow sent to lab

## 2020-02-13 NOTE — Progress Notes (Signed)
ANTICOAGULATION CONSULT NOTE - Initial Consult  Pharmacy Consult for Heparin Indication: chest pain/ACS  Allergies  Allergen Reactions  . Atorvastatin Other (See Comments)    "Felt as if she would pass out"  . Simvastatin Other (See Comments)    "Felt as if she would pass out"     Patient Measurements: Height: 5\' 2"  (157.5 cm) Weight: 67.6 kg (149 lb 0.5 oz) IBW/kg (Calculated) : 50.1 HEPARIN DW (KG): 64.1  Vital Signs: Temp: 97.2 F (36.2 C) (08/17 0158) Temp Source: Axillary (08/17 0158) BP: 149/53 (08/17 0158) Pulse Rate: 83 (08/17 0158)  Labs: Recent Labs    02/13/20 0137  HGB 8.0*  HCT 26.1*  PLT 469*  CREATININE 1.19*  TROPONINIHS 2,180*    Estimated Creatinine Clearance: 29.5 mL/min (A) (by C-G formula based on SCr of 1.19 mg/dL (H)).  Medical History: Past Medical History:  Diagnosis Date  . Diabetes mellitus without complication (HCC)   . Hypertension   . Peripheral artery disease (HCC)     Medications:  (Not in a hospital admission)   Assessment: Heparin initiated for ACS/STEMI.  No anticoagulants PTA per med list.  Baseline labs pending.  Goal of Therapy:  Heparin level 0.3-0.7 units/ml Monitor platelets by anticoagulation protocol: Yes   Plan:  Heparin 4000 units bolus the infusion at 800 units/hr Check HL ~ 8 hours after infusion started.  02/15/20, Jaquay Morneault A 02/13/2020,2:55 AM

## 2020-02-13 NOTE — Progress Notes (Signed)
This patient is a 86 yr old woman who presented to [facility] ED on 02/12/2020 with complaints of acute onset of dyspnea over the past 4 days. It became acutely worse last night. The patient was found to have an NSTEMI with CHF exacerbation (EF 45-50% and Grade II diastolic dysfunction). Troponins increased from 67 to 667. Cardiology has been consulted. The patient is on a heparin drip. She is receiving asa and plavix. She is getting sublingual nitroglycerin and morphine sulfate. Echocardiogram has been obtained which demonstrated EF 45-50%. LV has mildly decreased function. There are regional wall motion abnormalities and moderate left ventricular hypertrophy. Grade II diastolic dysfunction and severe hypokinesis of the left ventricular and entire anterolateral wall and inferolateral wall.   Vital signs are stable. The patient is currently requiring 4L to maintain saturations of 100%. She is resting comfortably. There are no new complaints. Heart sounds are within normal limits. There are rales at bases of both lungs bilaterally. Abdomen is soft, non-tender, non-distended. Positive bowel sounds. There is 2-3 + pitting edema of lower extremities bilaterally.   Cardiology has been consulted. The patient will be admitted to a progressive bed and will be monitored on telemetry.

## 2020-02-13 NOTE — Progress Notes (Signed)
ANTICOAGULATION CONSULT NOTE  Pharmacy Consult for Heparin Indication: chest pain/ACS  Allergies  Allergen Reactions   Atorvastatin Other (See Comments)    "Felt as if she would pass out"   Simvastatin Other (See Comments)    "Felt as if she would pass out"     Patient Measurements: Height: 5\' 2"  (157.5 cm) Weight: 67.6 kg (149 lb 0.5 oz) IBW/kg (Calculated) : 50.1 HEPARIN DW (KG): 64.1  Vital Signs: Temp: 97.2 F (36.2 C) (08/17 0158) Temp Source: Axillary (08/17 0158) BP: 118/61 (08/17 1200) Pulse Rate: 85 (08/17 1201)  Labs: Recent Labs    02/13/20 0137 02/13/20 0434 02/13/20 1241  HGB 8.0*  --   --   HCT 26.1*  --   --   PLT 469*  --   --   APTT 35  --   --   LABPROT 12.9  --   --   INR 1.0  --   --   HEPARINUNFRC  --   --  0.37  CREATININE 1.19*  --   --   TROPONINIHS 2,180* 1,848*  --     Estimated Creatinine Clearance: 29.5 mL/min (A) (by C-G formula based on SCr of 1.19 mg/dL (H)).  Medical History: Past Medical History:  Diagnosis Date   Diabetes mellitus without complication (HCC)    Hypertension    Peripheral artery disease (HCC)     Medications:  (Not in a hospital admission)   Assessment: Heparin initiated for ACS/STEMI.  No anticoagulants PTA per med list. Baseline CBC with Hgb of 8 which appears c/w patient's baseline. Baseline aPTT, PT-INR WNL.   Goal of Therapy:  Heparin level 0.3-0.7 units/ml Monitor platelets by anticoagulation protocol: Yes   Plan:  --8/17 at 1241 HL = 0.37, therapeutic x 1. Continue heparin at 800 units/hr --Re-check confirmatory HL at 2100 --Daily CBC per protocol  2101 02/13/2020,1:27 PM

## 2020-02-13 NOTE — H&P (Signed)
[place]   PATIENT NAME: Hannah Conway    MR#:  [id number]  DATE OF BIRTH:  Sep 16, 1929  DATE OF ADMISSION:  02/13/2020  PRIMARY CARE PHYSICIAN: Mickey Farber, MD   REQUESTING/REFERRING PHYSICIAN: Loleta Rose, MD  CHIEF COMPLAINT:   Chief Complaint  Patient presents with  . Respiratory Distress    HISTORY OF PRESENT ILLNESS:  Hannah Conway  is a 86 y.o. Caucasian female with a known history of type diabetes mellitus, hypertension and peripheral arterial disease, presented to the emergency room with acute onset of worsening dyspnea respiratory distress over the last 4 days that have gotten significantly worse tonight.  She had no significant worsening in lower extremity edema.  She admits to nausea without vomiting.  No chest pain or palpitations.  No headache or dizziness or blurred vision.  She has been having occasional dry cough.  No fever or chills.  No dysuria, oliguria or hematuria or flank pain.  Upon presentation to the emergency room, blood pressure was 149/53 and pulse 70 was 90% on CPAP with otherwise normal vital signs.  Labs revealed VBG with pH 7.42 and bicarbonate of 25.9.  CMP was unremarkable.  BNP was 1085.6.  High-sensitivity troponin I was 2180 and later 1848 with procalcitonin less than 0.1.  CBC showed leukocytosis of 14.5 with anemia with hemoglobin of 8 hematocrit 26.1.  Chest x-ray showed diffuse interstitial opacity worse in the lower lobes likely indicating pulmonary edema., EKG showed sinus rhythm with rate of 85 with PACs, incomplete right bundle branch block and prolonged QT interval with QTC of 503 MS.  The patient was given 40 aspirin as well as 20 mg of IV Lasix, IV heparin bolus and drip.  She will be admitted to a progressive unit bed for further evaluation and management.    PAST MEDICAL HISTORY:   Past Medical History:  Diagnosis Date  . Diabetes mellitus without complication (HCC)   . Hypertension   . Peripheral artery disease (HCC)      PAST SURGICAL HISTORY:   Past Surgical History:  Procedure Laterality Date  . ABDOMINAL HYSTERECTOMY     86 y.o. at time  . CHOLECYSTECTOMY  1965  . JOINT REPLACEMENT  2016   Bilat. Hip Replacements   . LOWER EXTREMITY ANGIOGRAPHY Right 08/09/2018   Procedure: LOWER EXTREMITY ANGIOGRAPHY;  Surgeon: Renford Dills, MD;  Location: [facility] INVASIVE CV LAB;  Service: Cardiovascular;  Laterality: Right;  . LOWER EXTREMITY ANGIOGRAPHY Right 01/13/2019   Procedure: LOWER EXTREMITY ANGIOGRAPHY;  Surgeon: Renford Dills, MD;  Location: [facility] INVASIVE CV LAB;  Service: Cardiovascular;  Laterality: Right;    SOCIAL HISTORY:   Social History   Tobacco Use  . Smoking status: Current Every Day Smoker    Packs/day: 1.00    Years: 30.00    Pack years: 30.00    Types: Cigarettes  . Smokeless tobacco: Never Used  Substance Use Topics  . Alcohol use: Never    FAMILY HISTORY:   Family History  Problem Relation Age of Onset  . Congestive Heart Failure Mother   . Diabetes Father   . Diabetes Sister     DRUG ALLERGIES:   Allergies  Allergen Reactions  . Atorvastatin Other (See Comments)    "Felt as if she would pass out"  . Simvastatin Other (See Comments)    "Felt as if she would pass out"     REVIEW OF SYSTEMS:   ROS As per history of present illness. All  pertinent systems were reviewed above. Constitutional, HEENT, cardiovascular, respiratory, GI, GU, musculoskeletal, neuro, psychiatric, endocrine, integumentary and hematologic systems were reviewed and are otherwise negative/unremarkable except for positive findings mentioned above in the HPI.   MEDICATIONS AT HOME:   Prior to Admission medications   Medication Sig Start Date End Date Taking? Authorizing Provider  acetaminophen (TYLENOL) 500 MG tablet Take 1,000 mg by mouth every 6 (six) hours as needed for moderate pain.     [provider]  aspirin EC 81 MG tablet Take 81 mg by mouth daily.     [provider]  benazepril-hydrochlorthiazide (LOTENSIN HCT) 20-12.5 MG tablet Take 1 tablet by mouth daily. 11/27/17   [provider]  buPROPion (WELLBUTRIN XL) 150 MG 24 hr tablet Take 150 mg by mouth daily.    [provider]  Calcium Citrate-Vitamin D (CALCIUM CITRATE + D3 PO) Take 2 tablets by mouth daily with lunch.    [provider]  Cinnamon 500 MG TABS Take 500 mg by mouth daily.    [provider]  clopidogrel (PLAVIX) 75 MG tablet Take 1 tablet by mouth once daily with breakfast Patient taking differently: Take 75 mg by mouth daily.  02/07/20   Georgiana Spinner, NP  Multiple Vitamin (MULTIVITAMIN WITH MINERALS) TABS tablet Take 1 tablet by mouth daily. Complete Multivitamin    [provider]  NOVOLOG MIX 70/30 FLEXPEN (70-30) 100 UNIT/ML FlexPen Inject 35-55 Units into the skin See admin instructions. Inject 55 units subcutaneously in the morning and 35 in the evening 02/01/18   [provider]  Omega-3 Fatty Acids (FISH OIL) 1000 MG CAPS Take 1,000 mg by mouth 2 (two) times daily.    [provider]  oxyCODONE (OXY IR/ROXICODONE) 5 MG immediate release tablet Take 1 tablet (5 mg total) by mouth every 6 (six) hours as needed for moderate pain or severe pain. Patient not taking: Reported on 01/05/2019 08/10/18   Annice Needy, MD  pravastatin (PRAVACHOL) 40 MG tablet Take 40 mg by mouth daily. 01/23/20   [provider]      VITAL SIGNS:  Blood pressure (!) 149/53, pulse 83, temperature (!) 97.2 F (36.2 C), temperature source Axillary, resp. rate 19, height 5\' 2"  (1.575 m), weight 67.6 kg, SpO2 98 %.  PHYSICAL EXAMINATION:  Physical Exam  GENERAL:  86 y.o.-year-old Caucasian female patient lying in the bed with no acute distress.  EYES: Pupils equal, round, reactive to light and accommodation. No scleral icterus. Extraocular muscles intact.  HEENT: Head atraumatic, normocephalic. Oropharynx and nasopharynx clear.    NECK:  Supple, no jugular venous distention. No thyroid enlargement, no tenderness.  LUNGS: Diminished bibasal breath sounds with bibasal rales. CARDIOVASCULAR: Regular rate and rhythm, S1, S2 normal. No murmurs, rubs, or gallops.  ABDOMEN: Soft, nondistended, nontender. Bowel sounds present. No organomegaly or mass.  EXTREMITIES: No pedal edema, cyanosis, or clubbing.  NEUROLOGIC: Cranial nerves II through XII are intact. Muscle strength 5/5 in all extremities. Sensation intact. Gait not checked.  PSYCHIATRIC: The patient is alert and oriented x 3.  Normal affect and good eye contact. SKIN: No obvious rash, lesion, or ulcer.   LABORATORY PANEL:   CBC Recent Labs  Lab 02/13/20 0137  WBC 14.5*  HGB 8.0*  HCT 26.1*  PLT 469*   ------------------------------------------------------------------------------------------------------------------  Chemistries  Recent Labs  Lab 02/13/20 0137  NA 130*  K 4.2  CL 91*  CO2 27  GLUCOSE 173*  BUN 24*  CREATININE 1.19*  CALCIUM 9.9  MG 2.8*  AST 48*  ALT 21  ALKPHOS 91  BILITOT 0.5   ------------------------------------------------------------------------------------------------------------------  Cardiac Enzymes No results for input(s): TROPONINI in the last 168 hours. ------------------------------------------------------------------------------------------------------------------  RADIOLOGY:  DG Chest Portable 1 View  Result Date: 02/13/2020 CLINICAL DATA:  Dyspnea EXAM: PORTABLE CHEST 1 VIEW COMPARISON:  04/28/2013 FINDINGS: Diffuse interstitial opacity, worst in the lower lobes. No pneumothorax or sizable pleural effusion. IMPRESSION: Diffuse interstitial opacity, worst in the lower lobes, likely indicating pulmonary edema. Electronically Signed   By: Deatra Robinson M.D.   On: 02/13/2020 02:09      IMPRESSION AND PLAN:   1.  Non-ST elevation myocardial infarction with subsequent acute CHF possibly diastolic. -The  patient will be admitted to a progressive unit bed. -We will continue him on IV heparin drip. -He will be continued on aspirin and Plavix as well as as placed on as needed sublingual nitroglycerin and morphine sulfate. -We will continue statin therapy and check fasting lipids. -We will follow serial troponin I's. -We will diurese with IV Lasix. -We will obtain a 2D echo this a.m. -We will obtain a cardiology consultation this a.m. -I notified Dr. Juliann Pares with about the patient.  2.  Essential hypertension. -The patient will be continued on benazepril HCT.  3.  Dyslipidemia. -We will continue statin therapy.  4.  Type 2 diabetes mellitus. -We will place the patient on supplement coverage with NovoLog and continue basal coverage it can be cut to half home dose when the patient is n.p.o.  5.  Depression. -We will continue Wellbutrin XL.  6.  DVT prophylaxis. -The patient will be on IV heparin drip.    All the records are reviewed and case discussed with ED provider. The plan of care was discussed in details with the patient (and family). I answered all questions. The patient agreed to proceed with the above mentioned plan. Further management will depend upon hospital course.   CODE STATUS: Full code  Status is: Inpatient  Remains inpatient appropriate because:Ongoing diagnostic testing needed not appropriate for outpatient work up, Unsafe d/c plan, IV treatments appropriate due to intensity of illness or inability to take PO and Inpatient level of care appropriate due to severity of illness   Dispo: The patient is from: Home              Anticipated d/c is to: Home              Anticipated d/c date is: 2 days              Patient currently is not medically stable to d/c.   TOTAL TIME TAKING CARE OF THIS PATIENT: 55 minutes.    Hannah Beat M.D on 02/13/2020 at 3:19 AM  [facility]   From 7 PM-7 AM, contact night-coverage [email or web address]  CC: Primary care  physician; Mickey Farber, MD   Note: This dictation was prepared with Dragon dictation along with smaller phrase technology. Any transcriptional typo errors that result from this process are unintentional.

## 2020-02-13 NOTE — Consult Note (Signed)
CARDIOLOGY CONSULT NOTE               Patient ID: COTINA FREEDMAN MRN: [id number] DOB/AGE: [birth year] 84 y.o.  Admit date: 02/13/2020 Referring Physician Dr. Valente David Primary Physician Dr. Hal Morales Primary Cardiologist Dr. Darrold Junker cardiologist Reason for Consultation acute congestive heart failure possible non-STEMI  HPI: Patient is a 86 year old female who has significant medical problems including diabetes hypertension peripheral vascular disease was preop for angiogram of lower extremities with Dr. Odella Aquas but the patient became acutely short of breath started 4 days ago but got much worse today so she finally presented to the emergency room with occasional dry cough no fever chills or sweats no significant chest pain but was found to have significant dyspnea EKG was nondiagnostic nonspecific findings she reportedly has known previous preserved left ventricular function has also had diastolic congestive heart failure in the past has had diabetes and hyperlipidemia now presents with elevated troponins possible non-STEMI and acute on chronic congestive heart failure diastolic dysfunction  Review of systems complete and found to be negative unless listed above     Past Medical History:  Diagnosis Date  . Diabetes mellitus without complication (HCC)   . Hypertension   . Peripheral artery disease [facility])     Past Surgical History:  Procedure Laterality Date  . ABDOMINAL HYSTERECTOMY     86 y.o. at time  . CHOLECYSTECTOMY  1965  . JOINT REPLACEMENT  2016   Bilat. Hip Replacements   . LOWER EXTREMITY ANGIOGRAPHY Right 08/09/2018   Procedure: LOWER EXTREMITY ANGIOGRAPHY;  Surgeon: Renford Dills, MD;  Location: [facility] INVASIVE CV LAB;  Service: Cardiovascular;  Laterality: Right;  . LOWER EXTREMITY ANGIOGRAPHY Right 01/13/2019   Procedure: LOWER EXTREMITY ANGIOGRAPHY;  Surgeon: Renford Dills, MD;  Location: [facility] INVASIVE CV LAB;  Service: Cardiovascular;  Laterality: Right;     Medications Prior to Admission  Medication Sig Dispense Refill Last Dose  . acetaminophen (TYLENOL) 500 MG tablet Take 1,000 mg by mouth every 6 (six) hours as needed for moderate pain.    02/12/2020 at 2000  . aspirin EC 81 MG tablet Take 81 mg by mouth daily.    02/12/2020 at 1000  . benazepril-hydrochlorthiazide (LOTENSIN HCT) 20-12.5 MG tablet Take 1 tablet by mouth daily.   02/12/2020 at 1000  . buPROPion (WELLBUTRIN XL) 150 MG 24 hr tablet Take 150 mg by mouth daily.   02/12/2020 at 1000  . Calcium Citrate-Vitamin D (CALCIUM CITRATE + D3 PO) Take 2 tablets by mouth daily with lunch.   02/12/2020 at 1800  . Cinnamon 500 MG TABS Take 500 mg by mouth daily.   02/12/2020 at 1000  . clopidogrel (PLAVIX) 75 MG tablet Take 1 tablet by mouth once daily with breakfast (Patient taking differently: Take 75 mg by mouth daily. ) 90 tablet 0 02/12/2020 at 1000  . Multiple Vitamin (MULTIVITAMIN WITH MINERALS) TABS tablet Take 1 tablet by mouth daily. Complete Multivitamin   02/12/2020 at 1000  . NOVOLOG MIX 70/30 FLEXPEN (70-30) 100 UNIT/ML FlexPen Inject 35-55 Units into the skin See admin instructions. Inject 55 units subcutaneously in the morning and 35 in the evening  1 02/12/2020 at 1800  . Omega-3 Fatty Acids (FISH OIL) 1000 MG CAPS Take 1,000 mg by mouth 2 (two) times daily.   02/12/2020 at 2000  . pravastatin (PRAVACHOL) 40 MG tablet Take 40 mg by mouth daily.   02/12/2020 at 2000  . oxyCODONE (OXY IR/ROXICODONE) 5 MG immediate release tablet  Take 1 tablet (5 mg total) by mouth every 6 (six) hours as needed for moderate pain or severe pain. (Patient not taking: Reported on 01/05/2019) 20 tablet 0    Social History   Socioeconomic History  . Marital status: Married    Spouse name: Burman Riis   . Number of children: 5  . Years of education: Not on file  . 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 [employer]  Tobacco Use  .  Smoking status: Current Every Day Smoker    Packs/day: 1.00    Years: 30.00    Pack years: 30.00    Types: Cigarettes  . Smokeless tobacco: Never Used  Vaping Use  . Vaping Use: Never used  Substance and Sexual Activity  . Alcohol use: Never  . Drug use: Not Currently  . Sexual activity: Not on file  Other Topics Concern  . Not on file  Social History Narrative  . Not on file   Social Determinants of Health   Financial Resource Strain:   . Difficulty of Paying Living Expenses:   Food Insecurity:   . Worried About Programme researcher, broadcasting/film/video in the Last Year:   . Barista in the Last Year:   Transportation Needs:   . Freight forwarder (Medical):   Marland Kitchen Lack of Transportation (Non-Medical):   Physical Activity:   . Days of Exercise per Week:   . Minutes of Exercise per Session:   Stress:   . Feeling of Stress :   Social Connections:   . Frequency of Communication with Friends and Family:   . Frequency of Social Gatherings with Friends and Family:   . Attends Religious Services:   . Active Member of Clubs or Organizations:   . Attends Banker Meetings:   Marland Kitchen Marital Status:   Intimate Partner Violence:   . Fear of Current or Ex-Partner:   . Emotionally Abused:   Marland Kitchen Physically Abused:   . Sexually Abused:     Family History  Problem Relation Age of Onset  . Congestive Heart Failure Mother   . Diabetes Father   . Diabetes Sister       Review of systems complete and found to be negative unless listed above      PHYSICAL EXAM  General: Well developed, well nourished, in no acute distress HEENT:  Normocephalic and atramatic Neck:  No JVD.  Lungs: Clear bilaterally to auscultation and percussion.  Mild diffuse crackles Heart: HRRR . Normal S1 and S2 without gallops or murmurs.  Abdomen: Bowel sounds are positive, abdomen soft and non-tender  Msk:  Back normal, normal gait. Normal strength and tone for age. Extremities: No clubbing, cyanosis or  edema.   Neuro: Alert and oriented X 3. Psych:  Good affect, responds appropriately  Labs:   Lab Results  Component Value Date   WBC 14.5 (H) 02/13/2020   HGB 8.0 (L) 02/13/2020   HCT 26.1 (L) 02/13/2020   MCV 79.6 (L) 02/13/2020   PLT 469 (H) 02/13/2020    Recent Labs  Lab 02/13/20 0137  NA 130*  K 4.2  CL 91*  CO2 27  BUN 24*  CREATININE 1.19*  CALCIUM 9.9  PROT 7.8  BILITOT 0.5  ALKPHOS 91  ALT 21  AST 48*  GLUCOSE 173*   Lab Results  Component Value Date   TROPONINI < 0.02 12/20/2012   No results  found for: CHOL No results found for: HDL No results found for: LDLCALC No results found for: TRIG No results found for: CHOLHDL No results found for: LDLDIRECT    Radiology: DG Chest Portable 1 View  Result Date: 02/13/2020 CLINICAL DATA:  Dyspnea EXAM: PORTABLE CHEST 1 VIEW COMPARISON:  04/28/2013 FINDINGS: Diffuse interstitial opacity, worst in the lower lobes. No pneumothorax or sizable pleural effusion. IMPRESSION: Diffuse interstitial opacity, worst in the lower lobes, likely indicating pulmonary edema. Electronically Signed   By: Deatra Robinson M.D.   On: 02/13/2020 02:09   VAS [place] ABI WITH/WO TBI  Result Date: 02/01/2020 LOWER EXTREMITY DOPPLER STUDY Indications: Peripheral artery disease, and 08/09/2018              Bilat CIA PTA stents              Left EIA PTA stent              Right SFA to Pop PTA stent.  Vascular Interventions: 01/13/2019: PTA and Stent to the Right SFA and Popliteal                         Artery. Mechanical Thrombectomy of Occluded Rt SFA                         stents. Comparison Study: 01/30/2019 Performing Technologist: Debbe Bales RVS  Examination Guidelines: A complete evaluation includes at minimum, Doppler waveform signals and systolic blood pressure reading at the level of bilateral brachial, anterior tibial, and posterior tibial arteries, when vessel segments are accessible. Bilateral testing is considered an integral part of a  complete examination. Photoelectric Plethysmograph (PPG) waveforms and toe systolic pressure readings are included as required and additional duplex testing as needed. Limited examinations for reoccurring indications may be performed as noted.  ABI Findings: +---------+------------------+-----+--------+--------+ Right    Rt Pressure (mmHg)IndexWaveformComment  +---------+------------------+-----+--------+--------+ Brachial 132                                     +---------+------------------+-----+--------+--------+ ATA      143               1.04 biphasic         +---------+------------------+-----+--------+--------+ PTA      130               0.94 biphasic         +---------+------------------+-----+--------+--------+ Great Toe110               0.80 Normal           +---------+------------------+-----+--------+--------+ +---------+------------------+-----+--------+-------+ Left     Lt Pressure (mmHg)IndexWaveformComment +---------+------------------+-----+--------+-------+ Brachial 138                                    +---------+------------------+-----+--------+-------+ ATA      99                0.72 biphasic        +---------+------------------+-----+--------+-------+ PERO     103               0.75 biphasic        +---------+------------------+-----+--------+-------+ Great Toe71                0.51 Abnormal        +---------+------------------+-----+--------+-------+ +-------+-----------+-----------+------------+------------+  ABI/TBIToday's ABIToday's TBIPrevious ABIPrevious TBI +-------+-----------+-----------+------------+------------+ Right  1.04       .80        1.04        .55          +-------+-----------+-----------+------------+------------+ Left   .75        .51        .73         .83          +-------+-----------+-----------+------------+------------+ Right ABIs appear essentially unchanged compared to prior study on  01/30/2019. Left ABIs appear essentially unchanged compared to prior study on 01/30/2019. Rt TBIs appear to be increased compared to prior study on 01/30/2019. Lt TBIs appear to be decreased compared to prior study on 01/30/2019.  Summary: Right: Resting right ankle-brachial index is within normal range. No evidence of significant right lower extremity arterial disease. The right toe-brachial index is normal. Left: Resting left ankle-brachial index indicates moderate left lower extremity arterial disease. The left toe-brachial index is abnormal.  *See table(s) above for measurements and observations.  Electronically signed by Levora Dredge MD on 02/01/2020 at 11:46:14 AM.    Final    ECHOCARDIOGRAM COMPLETE  Result Date: 02/13/2020    ECHOCARDIOGRAM REPORT   Patient Name:   JAYLA MACKIE Date of Exam: 02/13/2020 Medical Rec #:  [id number]     Height:       62.0 in Accession #:    [id number]    Weight:       149.0 lb Date of Birth:  1930-04-23     BSA:          1.687 m Patient Age:    89 years      BP:           149/53 mmHg Patient Gender: F             HR:           81 bpm. Exam Location:  [facility] Procedure: Color Doppler, Cardiac Doppler, 2D Echo and Intracardiac            Opacification Agent Indications:     NSTEMI 121.4  History:         Patient has no prior history of Echocardiogram examinations.                  Risk Factors:Hypertension and Diabetes. PAD.  Sonographer:     Cristela Blue RDCS (AE) Referring Phys:  [id number] Vernetta Honey MANSY Diagnosing Phys: Cristal Deer End MD  Sonographer Comments: No subcostal window, suboptimal apical window and suboptimal parasternal window. Image acquisition challenging due to COPD. IMPRESSIONS  1. Left ventricular ejection fraction, by estimation, is 45 to 50%. The left ventricle has mildly decreased function. The left ventricle demonstrates regional wall motion abnormalities (see scoring diagram/findings for description). There is moderate left ventricular hypertrophy. Left  ventricular diastolic parameters are consistent with Grade II diastolic dysfunction (pseudonormalization). Elevated left atrial pressure. There is severe hypokinesis of the left ventricular, entire anterolateral wall and inferolateral wall.  2. Right ventricular systolic function is normal. The right ventricular size is normal. Tricuspid regurgitation signal is inadequate for assessing PA pressure.  3. Left atrial size was mildly dilated.  4. The mitral valve is grossly normal. Moderate mitral valve regurgitation. No evidence of mitral stenosis.  5. Unable to determine valve morphology due to image quality. Aortic valve regurgitation is moderate. Severe aortic valve stenosis. Aortic valve mean gradient measures 40.0 mmHg. Aortic valve Vmax measures 3.98 m/s.  6.  Pulmonic valve regurgitation not well assessed. FINDINGS  Left Ventricle: Left ventricular ejection fraction, by estimation, is 45 to 50%. The left ventricle has mildly decreased function. The left ventricle demonstrates regional wall motion abnormalities. Severe hypokinesis of the left ventricular, entire anterolateral wall and inferolateral wall. Definity contrast agent was given IV to delineate the left ventricular endocardial borders. The left ventricular internal cavity size was normal in size. There is moderate left ventricular hypertrophy. Left ventricular diastolic parameters are consistent with Grade II diastolic dysfunction (pseudonormalization). Elevated left atrial pressure. Right Ventricle: The right ventricular size is normal. Right vetricular wall thickness was not assessed. Right ventricular systolic function is normal. Tricuspid regurgitation signal is inadequate for assessing PA pressure. Left Atrium: Left atrial size was mildly dilated. Right Atrium: Right atrial size was normal in size. Pericardium: The pericardium was not well visualized. Mitral Valve: The mitral valve is grossly normal. Moderate mitral annular calcification. Moderate  mitral valve regurgitation. No evidence of mitral valve stenosis. Tricuspid Valve: The tricuspid valve is not well visualized. Tricuspid valve regurgitation is trivial. Aortic Valve: Unable to determine valve morphology due to image quality.. There is severe thickening and mild calcification of the aortic valve. Aortic valve regurgitation is moderate. Severe aortic stenosis is present. There is severe thickening of the aortic valve. There is mild calcification of the aortic valve. Aortic valve mean gradient measures 40.0 mmHg. Aortic valve peak gradient measures 63.4 mmHg. Aortic valve area, by VTI measures 0.65 cm. Pulmonic Valve: The pulmonic valve was not well visualized. Pulmonic valve regurgitation not well assessed. Aorta: The aortic root is normal in size and structure. Pulmonary Artery: The pulmonary artery is not well seen. Venous: The inferior vena cava was not well visualized. IAS/Shunts: The interatrial septum was not well visualized.  LEFT VENTRICLE PLAX 2D LVIDd:         3.50 cm      Diastology LVIDs:         2.47 cm      LV e' lateral:   3.92 cm/s LV PW:         1.22 cm      LV E/e' lateral: 37.0 LV IVS:        1.05 cm      LV e' medial:    4.57 cm/s LVOT diam:     2.00 cm      LV E/e' medial:  31.7 LV SV:         57 LV SV Index:   34 LVOT Area:     3.14 cm  LV Volumes (MOD) LV vol d, MOD A2C: 96.8 ml LV vol d, MOD A4C: 110.0 ml LV vol s, MOD A2C: 48.3 ml LV vol s, MOD A4C: 57.4 ml LV SV MOD A2C:     48.5 ml LV SV MOD A4C:     110.0 ml LV SV MOD BP:      50.5 ml RIGHT VENTRICLE RV Basal diam:  2.67 cm RV S prime:     13.60 cm/s TAPSE (M-mode): 2.5 cm LEFT ATRIUM             Index       RIGHT ATRIUM           Index LA diam:        4.40 cm 2.61 cm/m  RA Area:     13.20 cm LA Vol (A2C):   82.9 ml 49.14 ml/m RA Volume:   30.10 ml  17.84 ml/m LA Vol (A4C):  49.6 ml 29.40 ml/m LA Biplane Vol: 64.4 ml 38.17 ml/m  AORTIC VALVE                    PULMONIC VALVE AV Area (Vmax):    0.69 cm     RVOT  Peak grad: 6 mmHg AV Area (Vmean):   0.60 cm AV Area (VTI):     0.65 cm AV Vmax:           398.00 cm/s AV Vmean:          283.333 cm/s AV VTI:            0.889 m AV Peak Grad:      63.4 mmHg AV Mean Grad:      40.0 mmHg LVOT Vmax:         87.50 cm/s LVOT Vmean:        53.800 cm/s LVOT VTI:          0.183 m LVOT/AV VTI ratio: 0.21  AORTA Ao Root diam: 2.80 cm MITRAL VALVE MV Area (PHT): 3.30 cm     SHUNTS MV Decel Time: 230 msec     Systemic VTI:  0.18 m MV E velocity: 145.00 cm/s  Systemic Diam: 2.00 cm MV A velocity: 119.00 cm/s MV E/A ratio:  1.22 Cristal Deer End MD Electronically signed by Yvonne Kendall MD Signature Date/Time: 02/13/2020/10:47:14 AM    Final    VAS [place] LOWER EXTREMITY ARTERIAL DUPLEX  Result Date: 02/01/2020 LOWER EXTREMITY ARTERIAL DUPLEX STUDY Indications: Peripheral artery disease, and 08/09/2018              Bilat CIA PTA stents              Left EIA PTA stent              Right SFA to Pop PTA stent.  Vascular Interventions: 01/13/2019: PTA and Stent to the Right SFA and Popliteal                         Artery. Mechanical Thrombectomy of Occluded Rt SFA                         stents. Current ABI:            Rt 1.04, Lt .75 Comparison Study: 01/30/2019 Performing Technologist: Debbe Bales RVS  Examination Guidelines: A complete evaluation includes B-mode imaging, spectral Doppler, color Doppler, and power Doppler as needed of all accessible portions of each vessel. Bilateral testing is considered an integral part of a complete examination. Limited examinations for reoccurring indications may be performed as noted.  +-----------+--------+-----+--------+--------+--------+ RIGHT      PSV cm/sRatioStenosisWaveformComments +-----------+--------+-----+--------+--------+--------+ CFA Distal 178                  biphasic         +-----------+--------+-----+--------+--------+--------+ DFA        213                  biphasic          +-----------+--------+-----+--------+--------+--------+ SFA Prox   303                  biphasicStent    +-----------+--------+-----+--------+--------+--------+ SFA Mid    95                   biphasicstent    +-----------+--------+-----+--------+--------+--------+ SFA Distal 69  biphasic         +-----------+--------+-----+--------+--------+--------+ POP Distal 102                  biphasic         +-----------+--------+-----+--------+--------+--------+ ATA Distal 85                   biphasic         +-----------+--------+-----+--------+--------+--------+ PTA Distal 14                   biphasic         +-----------+--------+-----+--------+--------+--------+ PERO Distal41                   biphasic         +-----------+--------+-----+--------+--------+--------+  +-----------+--------+-----+--------+--------+--------+ LEFT       PSV cm/sRatioStenosisWaveformComments +-----------+--------+-----+--------+--------+--------+ CFA Distal 135                  biphasic         +-----------+--------+-----+--------+--------+--------+ DFA        87                   biphasic         +-----------+--------+-----+--------+--------+--------+ SFA Prox   89                   biphasic         +-----------+--------+-----+--------+--------+--------+ SFA Mid    195                  biphasic         +-----------+--------+-----+--------+--------+--------+ SFA Distal 46                   biphasic         +-----------+--------+-----+--------+--------+--------+ POP Distal 70                   biphasic         +-----------+--------+-----+--------+--------+--------+ ATA Distal 49                   biphasic         +-----------+--------+-----+--------+--------+--------+ PTA Distal 0                    Absent           +-----------+--------+-----+--------+--------+--------+ PERO Distal26                    biphasic         +-----------+--------+-----+--------+--------+--------+   Summary: Right: Imaging and Waveforms obtained throughout in the Right Lower Extremity. Biphasic Waveforms seen predominantly in the Right Lower Extremity. Left: Imaging and Waveforms obtained throughout in the Left Lower Extremity. Biphasic Waveforms seen predominantly in the Left Lower Extremity.  See table(s) above for measurements and observations. Electronically signed by Levora DredgeGregory Schnier MD on 02/01/2020 at 11:46:17 AM.    Final     EKG: Normal sinus rhythm nonspecific T2 changes  ASSESSMENT AND PLAN:  Possible non-STEMI Acute congestive heart failure diastolic dysfunction Elevated troponins Hypertension Hyperlipidemia Diabetes type 2 Elevated BNP suggestive of heart failure Peripheral vascular disease Preop for peripheral angiogram Anemia . Plan Agree with admit follow-up troponins Continue to follow-up EKGs and telemetry Recommend echocardiogram for assessment of left ventricular function IV heparin therapy for anticoagulation temporarily Treat heart failure with diuretics Continue aspirin Plavix therapy Maintain diabetes management and control Recommend statin therapy with Pravachol Elevated BNP we will continue to follow as  patient is treated  Signed: Alwyn Pea MD  02/13/2020, 5:35 PM

## 2020-02-13 NOTE — ED Triage Notes (Signed)
Pt arrives [facility] from home w cc of respiratory distress. Pt has hx copd and emphysema, no O2 at home. Respiratory symptoms ongoing for 4 days. Low 80s ra at home. Now cpap.  Pt given 2 duonebs, 125 solumedrol, 2 magnesium sulfate running.   BP 130/80 HR 80s

## 2020-02-13 NOTE — Progress Notes (Signed)
Inpatient Diabetes Program Recommendations  AACE/ADA: New Consensus Statement on Inpatient Glycemic Control (2015)  Target Ranges:  Prepandial:   less than 140 mg/dL      Peak postprandial:   less than 180 mg/dL (1-2 hours)      Critically ill patients:  140 - 180 mg/dL   Lab Results  Component Value Date   GLUCAP 301 (H) 02/13/2020   HGBA1C 7.1 (H) 08/31/2013    Review of Glycemic Control Results for Hannah Conway, Hannah Conway (MRN [id number]) as of 02/13/2020 10:32  Ref. Range 02/13/2020 10:24  Glucose-Capillary Latest Ref Range: 70 - 99 mg/dL 037 (H)   Diabetes history: DM 2 Outpatient Diabetes medications:  Novolog 70/30 55 units q AM and 35 units 70/30 in the PM Current orders for Inpatient glycemic control:  Novolog 70/30 55 units in the AM and 35 units 70/30 in the PM  Inpatient Diabetes Program Recommendations:   Note that patient is NPO.  She has already received 70/30 this AM.  Recommend holding 70/30 while patient is NPO and instead add Novolog sensitive correction q 4 hours.  If patient remains NPO, May need to add basal insulin while 70/30 held.  Will follow.   Thanks,  Beryl Meager, RN, BC-ADM Inpatient Diabetes Coordinator Pager [phone] (8a-5p)

## 2020-02-14 DIAGNOSIS — N179 Acute kidney failure, unspecified: Secondary | ICD-10-CM

## 2020-02-14 DIAGNOSIS — I509 Heart failure, unspecified: Secondary | ICD-10-CM

## 2020-02-14 DIAGNOSIS — J810 Acute pulmonary edema: Secondary | ICD-10-CM

## 2020-02-14 LAB — GLUCOSE, CAPILLARY
Glucose-Capillary: 103 mg/dL — ABNORMAL HIGH (ref 70–99)
Glucose-Capillary: 146 mg/dL — ABNORMAL HIGH (ref 70–99)
Glucose-Capillary: 194 mg/dL — ABNORMAL HIGH (ref 70–99)
Glucose-Capillary: 165 mg/dL — ABNORMAL HIGH (ref 70–99)

## 2020-02-14 LAB — BASIC METABOLIC PANEL
Anion gap: 11 (ref 5–15)
BUN: 45 mg/dL — ABNORMAL HIGH (ref 8–23)
CO2: 27 mmol/L (ref 22–32)
Calcium: 9.5 mg/dL (ref 8.9–10.3)
Chloride: 92 mmol/L — ABNORMAL LOW (ref 98–111)
Creatinine, Ser: 1.51 mg/dL — ABNORMAL HIGH (ref 0.44–1.00)
GFR calc Af Amer: 35 mL/min — ABNORMAL LOW (ref >60)
GFR calc non Af Amer: 31 mL/min — ABNORMAL LOW (ref >60)
Glucose, Bld: 52 mg/dL — ABNORMAL LOW (ref 70–99)
Potassium: 4.3 mmol/L (ref 3.5–5.1)
Sodium: 130 mmol/L — ABNORMAL LOW (ref 135–145)

## 2020-02-14 LAB — CBC WITH DIFFERENTIAL/PLATELET
Abs Immature Granulocytes: 0.16 10*3/uL — ABNORMAL HIGH (ref 0.00–0.07)
Basophils Absolute: 0.1 10*3/uL (ref 0.0–0.1)
Basophils Relative: 0 %
Eosinophils Absolute: 0.6 10*3/uL — ABNORMAL HIGH (ref 0.0–0.5)
Eosinophils Relative: 3 %
HCT: 22.3 % — ABNORMAL LOW (ref 36.0–46.0)
Hemoglobin: 6.7 g/dL — ABNORMAL LOW (ref 12.0–15.0)
Immature Granulocytes: 1 %
Lymphocytes Relative: 25 %
Lymphs Abs: 4.8 10*3/uL — ABNORMAL HIGH (ref 0.7–4.0)
MCH: 24.1 pg — ABNORMAL LOW (ref 26.0–34.0)
MCHC: 30 g/dL (ref 30.0–36.0)
MCV: 80.2 fL (ref 80.0–100.0)
Monocytes Absolute: 2.1 10*3/uL — ABNORMAL HIGH (ref 0.1–1.0)
Monocytes Relative: 11 %
Neutro Abs: 11.4 10*3/uL — ABNORMAL HIGH (ref 1.7–7.7)
Neutrophils Relative %: 60 %
Platelets: 417 10*3/uL — ABNORMAL HIGH (ref 150–400)
RBC: 2.78 MIL/uL — ABNORMAL LOW (ref 3.87–5.11)
RDW: 15.9 % — ABNORMAL HIGH (ref 11.5–15.5)
WBC: 19.1 10*3/uL — ABNORMAL HIGH (ref 4.0–10.5)
nRBC: 0 % (ref 0.0–0.2)

## 2020-02-14 LAB — IRON AND TIBC
Iron: 19 ug/dL — ABNORMAL LOW (ref 28–170)
Saturation Ratios: 4 % — ABNORMAL LOW (ref 10.4–31.8)
TIBC: 454 ug/dL — ABNORMAL HIGH (ref 250–450)
UIBC: 435 ug/dL

## 2020-02-14 LAB — LIPID PANEL
Cholesterol: 114 mg/dL (ref 0–200)
HDL: 64 mg/dL (ref >40)
LDL Cholesterol: 39 mg/dL (ref 0–99)
Total CHOL/HDL Ratio: 1.8 RATIO
Triglycerides: 57 mg/dL (ref <150)
VLDL: 11 mg/dL (ref 0–40)

## 2020-02-14 LAB — CBC
HCT: 23.5 % — ABNORMAL LOW (ref 36.0–46.0)
Hemoglobin: 7.4 g/dL — ABNORMAL LOW (ref 12.0–15.0)
MCH: 24.3 pg — ABNORMAL LOW (ref 26.0–34.0)
MCHC: 31.5 g/dL (ref 30.0–36.0)
MCV: 77.3 fL — ABNORMAL LOW (ref 80.0–100.0)
Platelets: 420 10*3/uL — ABNORMAL HIGH (ref 150–400)
RBC: 3.04 MIL/uL — ABNORMAL LOW (ref 3.87–5.11)
RDW: 16.1 % — ABNORMAL HIGH (ref 11.5–15.5)
WBC: 20.1 10*3/uL — ABNORMAL HIGH (ref 4.0–10.5)
nRBC: 0.1 % (ref 0.0–0.2)

## 2020-02-14 LAB — HEPARIN LEVEL (UNFRACTIONATED)
Heparin Unfractionated: 0.34 IU/mL (ref 0.30–0.70)
Heparin Unfractionated: 0.33 IU/mL (ref 0.30–0.70)

## 2020-02-14 LAB — HEMOGLOBIN AND HEMATOCRIT, BLOOD
HCT: 25.5 % — ABNORMAL LOW (ref 36.0–46.0)
Hemoglobin: 7.9 g/dL — ABNORMAL LOW (ref 12.0–15.0)

## 2020-02-14 LAB — FERRITIN: Ferritin: 13 ng/mL (ref 11–307)

## 2020-02-14 LAB — ABO/RH: ABO/RH(D): O POS

## 2020-02-14 LAB — TROPONIN I (HIGH SENSITIVITY)
Troponin I (High Sensitivity): 1392 ng/L (ref <18)
Troponin I (High Sensitivity): 1517 ng/L (ref <18)

## 2020-02-14 LAB — PREPARE RBC (CROSSMATCH)

## 2020-02-14 MED ORDER — SODIUM CHLORIDE 0.9% IV SOLUTION: Freq: Once | INTRAVENOUS | Status: AC

## 2020-02-14 MED ORDER — INSULIN GLARGINE 100 UNIT/ML ~~LOC~~ SOLN
25.0000 [IU] | Freq: Every day | SUBCUTANEOUS | Status: DC
  Administered 2020-02-15: 25 [IU] via SUBCUTANEOUS
  Filled 2020-02-14 (×4): qty 0.25

## 2020-02-14 MED ORDER — INSULIN ASPART 100 UNIT/ML ~~LOC~~ SOLN
0.0000 [IU] | Freq: Three times a day (TID) | SUBCUTANEOUS | Status: DC
  Administered 2020-02-14: 2 [IU] via SUBCUTANEOUS
  Administered 2020-02-15 (×2): 1 [IU] via SUBCUTANEOUS
  Administered 2020-02-15: 5 [IU] via SUBCUTANEOUS
  Administered 2020-02-16: 2 [IU] via SUBCUTANEOUS
  Administered 2020-02-16: 3 [IU] via SUBCUTANEOUS
  Filled 2020-02-14 (×6): qty 1

## 2020-02-14 MED ORDER — FUROSEMIDE 10 MG/ML IJ SOLN
20.0000 mg | Freq: Once | INTRAMUSCULAR | Status: AC
  Administered 2020-02-14: 20 mg via INTRAVENOUS
  Filled 2020-02-14: qty 2

## 2020-02-14 NOTE — Progress Notes (Addendum)
Inpatient Diabetes Program Recommendations  AACE/ADA: New Consensus Statement on Inpatient Glycemic Control (2015)  Target Ranges:  Prepandial:   less than 140 mg/dL      Peak postprandial:   less than 180 mg/dL (1-2 hours)      Critically ill patients:  140 - 180 mg/dL   Lab Results  Component Value Date   GLUCAP 146 (H) 02/14/2020   HGBA1C 7.1 (H) 08/31/2013    Review of Glycemic Control Results for Hannah Conway, Hannah Conway (MRN [id number]) as of 02/14/2020 12:57  Ref. Range 02/13/2020 12:24 02/13/2020 17:36 02/13/2020 17:56 02/14/2020 08:57 02/14/2020 11:49  Glucose-Capillary Latest Ref Range: 70 - 99 mg/dL 155 (H) 208 (H) 022 (H) 103 (H) 146 (H)  Diabetes history: DM 2 Outpatient Diabetes medications:  Novolog 70/30 55 units q AM and 35 units 70/30 in the PM Current orders for Inpatient glycemic control:  Novolog 70/30 35 units in the AM and 35 units 70/30 in the PM Diabetes history:  Inpatient Diabetes Program Recommendations:    Consider d/c of 70/30 while patient is in the hospital.  Consider adding Lantus 25 units q HS tonight.   Thanks  Beryl Meager, RN, BC-ADM Inpatient Diabetes Coordinator Pager [phone] (8a-5p)

## 2020-02-14 NOTE — Progress Notes (Signed)
ANTICOAGULATION CONSULT NOTE  Pharmacy Consult for Heparin Indication: chest pain/ACS  Allergies  Allergen Reactions  . Atorvastatin Other (See Comments)    "Felt as if she would pass out"  . Simvastatin Other (See Comments)    "Felt as if she would pass out"     Patient Measurements: Height: 5\' 2"  (157.5 cm) Weight: 73.6 kg (162 lb 4.8 oz) IBW/kg (Calculated) : 50.1 HEPARIN DW (KG): 65.4  Vital Signs: Temp: 98.6 F (37 C) (08/18 1208) Temp Source: Oral (08/18 1208) BP: 102/41 (08/18 1208) Pulse Rate: 72 (08/18 1208)  Labs: Recent Labs    02/13/20 0137 02/13/20 0137 02/13/20 0434 02/13/20 1241 02/13/20 2051 02/14/20 0518 02/14/20 0707 02/14/20 1116  HGB 8.0*   < >  --   --   --  6.7* 7.4*  --   HCT 26.1*  --   --   --   --  22.3* 23.5*  --   PLT 469*  --   --   --   --  417* 420*  --   APTT 35  --   --   --   --   --   --   --   LABPROT 12.9  --   --   --   --   --   --   --   INR 1.0  --   --   --   --   --   --   --   HEPARINUNFRC  --   --   --    < > 0.25* 0.34  --  0.33  CREATININE 1.19*  --   --   --   --   --  1.51*  --   TROPONINIHS 2,180*  --  1,848*  --   --   --   --   --    < > = values in this interval not displayed.    Estimated Creatinine Clearance: 24.2 mL/min (A) (by C-G formula based on SCr of 1.51 mg/dL (H)).  Medical History: Past Medical History:  Diagnosis Date  . Diabetes mellitus without complication (HCC)   . Hypertension   . Peripheral artery disease (HCC)     Medications:  Medications Prior to Admission  Medication Sig Dispense Refill Last Dose  . acetaminophen (TYLENOL) 500 MG tablet Take 1,000 mg by mouth every 6 (six) hours as needed for moderate pain.    02/12/2020 at 2000  . aspirin EC 81 MG tablet Take 81 mg by mouth daily.    02/12/2020 at 1000  . benazepril-hydrochlorthiazide (LOTENSIN HCT) 20-12.5 MG tablet Take 1 tablet by mouth daily.   02/12/2020 at 1000  . buPROPion (WELLBUTRIN XL) 150 MG 24 hr tablet Take 150 mg by  mouth daily.   02/12/2020 at 1000  . Calcium Citrate-Vitamin D (CALCIUM CITRATE + D3 PO) Take 2 tablets by mouth daily with lunch.   02/12/2020 at 1800  . Cinnamon 500 MG TABS Take 500 mg by mouth daily.   02/12/2020 at 1000  . clopidogrel (PLAVIX) 75 MG tablet Take 1 tablet by mouth once daily with breakfast (Patient taking differently: Take 75 mg by mouth daily. ) 90 tablet 0 02/12/2020 at 1000  . Multiple Vitamin (MULTIVITAMIN WITH MINERALS) TABS tablet Take 1 tablet by mouth daily. Complete Multivitamin   02/12/2020 at 1000  . NOVOLOG MIX 70/30 FLEXPEN (70-30) 100 UNIT/ML FlexPen Inject 35-55 Units into the skin See admin instructions. Inject 55 units subcutaneously in the morning  and 35 in the evening  1 02/12/2020 at 1800  . Omega-3 Fatty Acids (FISH OIL) 1000 MG CAPS Take 1,000 mg by mouth 2 (two) times daily.   02/12/2020 at 2000  . pravastatin (PRAVACHOL) 40 MG tablet Take 40 mg by mouth daily.   02/12/2020 at 2000  . oxyCODONE (OXY IR/ROXICODONE) 5 MG immediate release tablet Take 1 tablet (5 mg total) by mouth every 6 (six) hours as needed for moderate pain or severe pain. (Patient not taking: Reported on 01/05/2019) 20 tablet 0     Assessment: Heparin initiated for ACS/STEMI.  No anticoagulants PTA per med list. Baseline CBC with Hgb of 8 which appears c/w patient's baseline. Baseline aPTT, PT-INR WNL.   8/18 0518 HL 0.34  8/18 1116 HL 0.32  Goal of Therapy:  Heparin level 0.3-0.7 units/ml Monitor platelets by anticoagulation protocol: Yes   Plan:  Heparin level is therapeutic x 2. Hgb 6.8 > 7.4, PLTs stable.  Will continue heparin infusion at 900 units/hr. Recheck heparin level and CBC with AM labs.    Ronnald Ramp, PharmD, BCPS 02/14/2020,12:43 PM

## 2020-02-14 NOTE — Progress Notes (Addendum)
ANTICOAGULATION CONSULT NOTE  Pharmacy Consult for Heparin Indication: chest pain/ACS  Allergies  Allergen Reactions  . Atorvastatin Other (See Comments)    "Felt as if she would pass out"  . Simvastatin Other (See Comments)    "Felt as if she would pass out"     Patient Measurements: Height: 5\' 2"  (157.5 cm) Weight: 73.6 kg (162 lb 4.8 oz) IBW/kg (Calculated) : 50.1 HEPARIN DW (KG): 65.4  Vital Signs: Temp: 97.5 F (36.4 C) (08/18 0404) Temp Source: Oral (08/18 0404) BP: 146/56 (08/18 0404) Pulse Rate: 81 (08/18 0404)  Labs: Recent Labs    02/13/20 0137 02/13/20 0434 02/13/20 1241 02/13/20 2051 02/14/20 0518  HGB 8.0*  --   --   --  6.7*  HCT 26.1*  --   --   --  22.3*  PLT 469*  --   --   --  417*  APTT 35  --   --   --   --   LABPROT 12.9  --   --   --   --   INR 1.0  --   --   --   --   HEPARINUNFRC  --   --  0.37 0.25* 0.34  CREATININE 1.19*  --   --   --   --   TROPONINIHS 2,180* 1,848*  --   --   --     Estimated Creatinine Clearance: 30.7 mL/min (A) (by C-G formula based on SCr of 1.19 mg/dL (H)).  Medical History: Past Medical History:  Diagnosis Date  . Diabetes mellitus without complication (HCC)   . Hypertension   . Peripheral artery disease (HCC)     Medications:  Medications Prior to Admission  Medication Sig Dispense Refill Last Dose  . acetaminophen (TYLENOL) 500 MG tablet Take 1,000 mg by mouth every 6 (six) hours as needed for moderate pain.    02/12/2020 at 2000  . aspirin EC 81 MG tablet Take 81 mg by mouth daily.    02/12/2020 at 1000  . benazepril-hydrochlorthiazide (LOTENSIN HCT) 20-12.5 MG tablet Take 1 tablet by mouth daily.   02/12/2020 at 1000  . buPROPion (WELLBUTRIN XL) 150 MG 24 hr tablet Take 150 mg by mouth daily.   02/12/2020 at 1000  . Calcium Citrate-Vitamin D (CALCIUM CITRATE + D3 PO) Take 2 tablets by mouth daily with lunch.   02/12/2020 at 1800  . Cinnamon 500 MG TABS Take 500 mg by mouth daily.   02/12/2020 at 1000  .  clopidogrel (PLAVIX) 75 MG tablet Take 1 tablet by mouth once daily with breakfast (Patient taking differently: Take 75 mg by mouth daily. ) 90 tablet 0 02/12/2020 at 1000  . Multiple Vitamin (MULTIVITAMIN WITH MINERALS) TABS tablet Take 1 tablet by mouth daily. Complete Multivitamin   02/12/2020 at 1000  . NOVOLOG MIX 70/30 FLEXPEN (70-30) 100 UNIT/ML FlexPen Inject 35-55 Units into the skin See admin instructions. Inject 55 units subcutaneously in the morning and 35 in the evening  1 02/12/2020 at 1800  . Omega-3 Fatty Acids (FISH OIL) 1000 MG CAPS Take 1,000 mg by mouth 2 (two) times daily.   02/12/2020 at 2000  . pravastatin (PRAVACHOL) 40 MG tablet Take 40 mg by mouth daily.   02/12/2020 at 2000  . oxyCODONE (OXY IR/ROXICODONE) 5 MG immediate release tablet Take 1 tablet (5 mg total) by mouth every 6 (six) hours as needed for moderate pain or severe pain. (Patient not taking: Reported on 01/05/2019) 20 tablet 0  Assessment: Heparin initiated for ACS/STEMI.  No anticoagulants PTA per med list. Baseline CBC with Hgb of 8 which appears c/w patient's baseline. Baseline aPTT, PT-INR WNL.   Goal of Therapy:  Heparin level 0.3-0.7 units/ml Monitor platelets by anticoagulation protocol: Yes   Plan:  --8/18 at 0518 HL = 0.34, therapeutic x 1. H/H worse, PLTs stable.  Will continue heparin infusion at 900 units/hr.  --Re-check HL in 6 hours to confirm --Daily CBC per protocol  Valrie Hart A 02/14/2020,6:54 AM

## 2020-02-14 NOTE — Progress Notes (Addendum)
[facility] Cardiology    SUBJECTIVE: States to not feel well today.  Presented with congestive heart failure dyspnea feels sick today denies any chest pain still has dyspnea shortness of breath no fever.   Vitals:   02/14/20 0746 02/14/20 1208 02/14/20 1553 02/14/20 1613  BP: 118/67 (!) 102/41 98/65 (!) 139/59  Pulse: 75 72 77 86  Resp: 19 19 16  (!) 22  Temp: 97.7 F (36.5 C) 98.6 F (37 C) 98.5 F (36.9 C) 98.3 F (36.8 C)  TempSrc: Oral Oral Oral Oral  SpO2: 99% 97% 98% 99%  Weight:      Height:         Intake/Output Summary (Last 24 hours) at 02/14/2020 1715 Last data filed at 02/14/2020 1617 Gross per 24 hour  Intake 1179.57 ml  Output 900 ml  Net 279.57 ml      PHYSICAL EXAM  General: Well developed, well nourished, in no acute distress HEENT:  Normocephalic and atramatic Neck:  No JVD.  Lungs: Clear bilaterally to auscultation and percussion. Heart: HRRR . Normal S1 and S2 without gallops or 3/6 semmurmurs.  Abdomen: Bowel sounds are positive, abdomen soft and non-tender  Msk:  Back normal, normal gait. Normal strength and tone for age. Extremities: No clubbing, cyanosis or edema.   Neuro: Alert and oriented X 3. Psych:  Good affect, responds appropriately   LABS: Basic Metabolic Panel: Recent Labs    02/13/20 0137 02/13/20 0656 02/14/20 0707  NA 130*  --  130*  K 4.2  --  4.3  CL 91*  --  92*  CO2 27  --  27  GLUCOSE 173*  --  52*  BUN 24*  --  45*  CREATININE 1.19*  --  1.51*  CALCIUM 9.9  --  9.5  MG 2.8* 2.1  --    Liver Function Tests: Recent Labs    02/13/20 0137  AST 48*  ALT 21  ALKPHOS 91  BILITOT 0.5  PROT 7.8  ALBUMIN 3.4*   No results for input(s): LIPASE, AMYLASE in the last 72 hours. CBC: Recent Labs    02/13/20 0137 02/13/20 0137 02/14/20 0518 02/14/20 0707  WBC 14.5*   < > 19.1* 20.1*  NEUTROABS 8.9*  --  11.4*  --   HGB 8.0*   < > 6.7* 7.4*  HCT 26.1*   < > 22.3* 23.5*  MCV 79.6*   < > 80.2 77.3*  PLT 469*   < >  417* 420*   < > = values in this interval not displayed.   Cardiac Enzymes: No results for input(s): CKTOTAL, CKMB, CKMBINDEX, TROPONINI in the last 72 hours. BNP: Invalid input(s): POCBNP D-Dimer: No results for input(s): DDIMER in the last 72 hours. Hemoglobin A1C: No results for input(s): HGBA1C in the last 72 hours. Fasting Lipid Panel: Recent Labs    02/14/20 0707  CHOL 114  HDL 64  LDLCALC 39  TRIG 57  CHOLHDL 1.8   Thyroid Function Tests: Recent Labs    02/13/20 0656  TSH 0.763   Anemia Panel: Recent Labs    02/14/20 1116  FERRITIN 13  TIBC 454*  IRON 19*    DG Chest Portable 1 View  Result Date: 02/13/2020 CLINICAL DATA:  Dyspnea EXAM: PORTABLE CHEST 1 VIEW COMPARISON:  04/28/2013 FINDINGS: Diffuse interstitial opacity, worst in the lower lobes. No pneumothorax or sizable pleural effusion. IMPRESSION: Diffuse interstitial opacity, worst in the lower lobes, likely indicating pulmonary edema. Electronically Signed   By: 04/30/2013  Chase PicketHerman M.D.   On: 02/13/2020 02:09   ECHOCARDIOGRAM COMPLETE  Result Date: 02/13/2020    ECHOCARDIOGRAM REPORT   Patient Name:   Hannah Conway Date of Exam: 02/13/2020 Medical Rec #:  [id number]     Height:       62.0 in Accession #:    [id number]    Weight:       149.0 lb Date of Birth:  September 26, 1929     BSA:          1.687 m Patient Age:    86 years      BP:           149/53 mmHg Patient Gender: F             HR:           81 bpm. Exam Location:  [facility] Procedure: Color Doppler, Cardiac Doppler, 2D Echo and Intracardiac            Opacification Agent Indications:     NSTEMI 121.4  History:         Patient has no prior history of Echocardiogram examinations.                  Risk Factors:Hypertension and Diabetes. PAD.  Sonographer:     Cristela BlueJerry Hege RDCS (AE) Referring Phys:  [id number] Vernetta HoneyJAN A MANSY Diagnosing Phys: Cristal Deerhristopher End MD  Sonographer Comments: No subcostal window, suboptimal apical window and suboptimal parasternal window. Image acquisition  challenging due to COPD. IMPRESSIONS  1. Left ventricular ejection fraction, by estimation, is 45 to 50%. The left ventricle has mildly decreased function. The left ventricle demonstrates regional wall motion abnormalities (see scoring diagram/findings for description). There is moderate left ventricular hypertrophy. Left ventricular diastolic parameters are consistent with Grade II diastolic dysfunction (pseudonormalization). Elevated left atrial pressure. There is severe hypokinesis of the left ventricular, entire anterolateral wall and inferolateral wall.  2. Right ventricular systolic function is normal. The right ventricular size is normal. Tricuspid regurgitation signal is inadequate for assessing PA pressure.  3. Left atrial size was mildly dilated.  4. The mitral valve is grossly normal. Moderate mitral valve regurgitation. No evidence of mitral stenosis.  5. Unable to determine valve morphology due to image quality. Aortic valve regurgitation is moderate. Severe aortic valve stenosis. Aortic valve mean gradient measures 40.0 mmHg. Aortic valve Vmax measures 3.98 m/s.  6. Pulmonic valve regurgitation not well assessed. FINDINGS  Left Ventricle: Left ventricular ejection fraction, by estimation, is 45 to 50%. The left ventricle has mildly decreased function. The left ventricle demonstrates regional wall motion abnormalities. Severe hypokinesis of the left ventricular, entire anterolateral wall and inferolateral wall. Definity contrast agent was given IV to delineate the left ventricular endocardial borders. The left ventricular internal cavity size was normal in size. There is moderate left ventricular hypertrophy. Left ventricular diastolic parameters are consistent with Grade II diastolic dysfunction (pseudonormalization). Elevated left atrial pressure. Right Ventricle: The right ventricular size is normal. Right vetricular wall thickness was not assessed. Right ventricular systolic function is normal.  Tricuspid regurgitation signal is inadequate for assessing PA pressure. Left Atrium: Left atrial size was mildly dilated. Right Atrium: Right atrial size was normal in size. Pericardium: The pericardium was not well visualized. Mitral Valve: The mitral valve is grossly normal. Moderate mitral annular calcification. Moderate mitral valve regurgitation. No evidence of mitral valve stenosis. Tricuspid Valve: The tricuspid valve is not well visualized. Tricuspid valve regurgitation is trivial. Aortic Valve: Unable to determine valve morphology  due to image quality.. There is severe thickening and mild calcification of the aortic valve. Aortic valve regurgitation is moderate. Severe aortic stenosis is present. There is severe thickening of the aortic valve. There is mild calcification of the aortic valve. Aortic valve mean gradient measures 40.0 mmHg. Aortic valve peak gradient measures 63.4 mmHg. Aortic valve area, by VTI measures 0.65 cm. Pulmonic Valve: The pulmonic valve was not well visualized. Pulmonic valve regurgitation not well assessed. Aorta: The aortic root is normal in size and structure. Pulmonary Artery: The pulmonary artery is not well seen. Venous: The inferior vena cava was not well visualized. IAS/Shunts: The interatrial septum was not well visualized.  LEFT VENTRICLE PLAX 2D LVIDd:         3.50 cm      Diastology LVIDs:         2.47 cm      LV e' lateral:   3.92 cm/s LV PW:         1.22 cm      LV E/e' lateral: 37.0 LV IVS:        1.05 cm      LV e' medial:    4.57 cm/s LVOT diam:     2.00 cm      LV E/e' medial:  31.7 LV SV:         57 LV SV Index:   34 LVOT Area:     3.14 cm  LV Volumes (MOD) LV vol d, MOD A2C: 96.8 ml LV vol d, MOD A4C: 110.0 ml LV vol s, MOD A2C: 48.3 ml LV vol s, MOD A4C: 57.4 ml LV SV MOD A2C:     48.5 ml LV SV MOD A4C:     110.0 ml LV SV MOD BP:      50.5 ml RIGHT VENTRICLE RV Basal diam:  2.67 cm RV S prime:     13.60 cm/s TAPSE (M-mode): 2.5 cm LEFT ATRIUM             Index        RIGHT ATRIUM           Index LA diam:        4.40 cm 2.61 cm/m  RA Area:     13.20 cm LA Vol (A2C):   82.9 ml 49.14 ml/m RA Volume:   30.10 ml  17.84 ml/m LA Vol (A4C):   49.6 ml 29.40 ml/m LA Biplane Vol: 64.4 ml 38.17 ml/m  AORTIC VALVE                    PULMONIC VALVE AV Area (Vmax):    0.69 cm     RVOT Peak grad: 6 mmHg AV Area (Vmean):   0.60 cm AV Area (VTI):     0.65 cm AV Vmax:           398.00 cm/s AV Vmean:          283.333 cm/s AV VTI:            0.889 m AV Peak Grad:      63.4 mmHg AV Mean Grad:      40.0 mmHg LVOT Vmax:         87.50 cm/s LVOT Vmean:        53.800 cm/s LVOT VTI:          0.183 m LVOT/AV VTI ratio: 0.21  AORTA Ao Root diam: 2.80 cm MITRAL VALVE MV Area (PHT): 3.30 cm     SHUNTS  MV Decel Time: 230 msec     Systemic VTI:  0.18 m MV E velocity: 145.00 cm/s  Systemic Diam: 2.00 cm MV A velocity: 119.00 cm/s MV E/A ratio:  1.22 Cristal Deer End MD Electronically signed by Yvonne Kendall MD Signature Date/Time: 02/13/2020/10:47:14 AM    Final      Echo EF=45-50%  TELEMETRY: NSR NSSTW changes  ASSESSMENT AND PLAN:  Active Problems:   NSTEMI (non-ST elevated myocardial infarction) (HCC) Aortic stenosis Congestive heart failure diastolic dysfunction Systolic cardiomyopathy ejection fraction around 45 to 50% Diabetes type 2 Generalized weakness Anemia  Plan Continue heart failure therapy supplemental oxygen inhalers diuretics Heart failure therapy with mild reduced left ventricular function Continue supportive care Agree with EKGs and troponins as well as telemetry Recommend therapy for cardiomyopathy and heart failure Continue supplemental oxygen as necessary    Alwyn Pea, MD 02/14/2020 5:15 PM

## 2020-02-14 NOTE — Progress Notes (Addendum)
PROGRESS NOTE    Hannah Conway    Code Status: Full Code  ZOX:[id number] DOB: 06/19/1930 DOA: 02/13/2020 LOS: 1 days  PCP: Mickey Farber, MD CC:  Chief Complaint  Patient presents with  . Respiratory Distress       Hospital Summary   This is an 86 year old female with a past medical history of diabetes, hypertension, PVD who presented to the ED on 8/17 with acutely worsening dyspnea and respiratory distress x4 days which had worsened overnight prior to admission.  Also had dry cough, nausea without vomiting.  Denied any chest pain or palpitations  Upon presentation to the emergency room, blood pressure was 149/53 and pulse 70 was 90% on CPAP with otherwise normal vital signs.  Labs revealed VBG with pH 7.42 and bicarbonate of 25.9.  CMP was unremarkable.  BNP was 1085.6.  High-sensitivity troponin I was 2180 and later 1848 with procalcitonin less than 0.1.  CBC showed leukocytosis of 14.5 with anemia with hemoglobin of 8 hematocrit 26.1.  Chest x-ray showed diffuse interstitial opacity worse in the lower lobes likely indicating pulmonary edema., EKG showed sinus rhythm with rate of 85 with PACs, incomplete right bundle branch block and prolonged QT interval with QTC of 503 MS.  The patient was given aspirin as well as 20 mg of IV Lasix, IV heparin bolus and drip.    She was admitted to a progressive bed and cardiology was consulted.  An echo was obtained and showed EF 45 to 50% moderate LVH, grade 2 diastolic dysfunction and severe hypokinesis of left ventricle and entire anterolateral wall and inferior lateral wall as well as moderate MR and severe AS and moderate aortic regurgitation.  8/18: Hb 6.7 -> 7.4 on repeat.  Given 1 unit PRBC transfusion in setting of NSTEMI  A & P   Active Problems:   NSTEMI (non-ST elevated myocardial infarction) (HCC)   1. NSTEMI a. Currently chest pain-free, afebrile but with borderline low SBP b. Echo with wall motion and valvular abnormalities as  above c. Continue heparin drip d. Continue aspirin and Plavix e. Check lipid panel f. Holding beta-blocker for now given her soft BPs g. Continue telemetry h. Patient wishes to remain full code after goals of care discussion i. Appreciate further cardiology recommendations  2. Acute on chronic iron deficiency anemia a. Hb 6.7 -> 7.4 on repeat b. No obvious bleeding source c. Iron studies show significant iron deficiency d. will give 1 unit PRBC transfusion in setting of NSTEMI for goal Hb >8.0 with a dose of Lasix following the transfusion e. We will give iron infusion likely starting tomorrow but will try and limit high volumes of fluids  3. Diastolic heart failure exacerbation, Likely from NSTEMI a. Echo: EF 45 to 50% moderate LVH, grade 2 diastolic dysfunction and severe hypokinesis of left ventricle and entire anterolateral wall and inferior lateral wall as well as moderate MR and severe AS and moderate aortic regurgitation b. Continue diuresis c. Continue heart failure protocol  4. AKI on CKD 3a, likely multifactorial: Cardiorenal, anemia, hemodynamic changes, ACE inhibitor/HCTZ a. Continue to monitor with changes as above and below  5. Leukocytosis a. Afebrile and no obvious source of infection b. Could be reactive to NSTEMI c. Hold off antibiotics for now  6. Hypertension a. Hold antihypertensives for today  7. Hyperlipidemia a. Check lipid panel b. will likely change pravastatin for high intensity statin given NSTEMI  8. Type 2 diabetes a. Diabetic coordinator on board: DC 70/30 while inpatient  and Lantus 25 units nightly b. Continue sensitive sliding scale  9. Depression a. Continue Wellbutrin XL   DVT prophylaxis: Heparin   Family Communication: Patient's family at bedside has been updated   Disposition Plan:  Status is: Inpatient  Remains inpatient appropriate because:Unsafe d/c plan, IV treatments appropriate due to intensity of illness or inability to  take PO and Inpatient level of care appropriate due to severity of illness   Dispo: The patient is from: Home              Anticipated d/c is to: TBD              Anticipated d/c date is: 3 days              Patient currently is not medically stable to d/c.          Pressure injury documentation    None  Consultants  Cardiology   Procedures  None  Antibiotics   Anti-infectives (From admission, onward)   None        Subjective   Patient seen and examined at bedside in no acute  distress and resting comfortably but states that she has generalized malaise which is only somewhat improved but not resolved from when she presented.  Denies any chest pain or palpitations, nausea or vomiting.  States that she felt flushed as the nurse tried to obtain an IV which had improved otherwise no other issues or complaints.  Objective   Vitals:   02/14/20 0404 02/14/20 0500 02/14/20 0746 02/14/20 1208  BP: (!) 146/56  118/67 (!) 102/41  Pulse: 81  75 72  Resp: (!) Temp: (!) 97.5 F (36.4 C)  97.7 F (36.5 C) 98.6 F (37 C)  TempSrc: Oral  Oral Oral  SpO2: 99%  99% 97%  Weight:  73.6 kg    Height:        Intake/Output Summary (Last 24 hours) at 02/14/2020 1409 Last data filed at 02/14/2020 1208 Gross per 24 hour  Intake 1179.57 ml  Output 400 ml  Net 779.57 ml   Filed Weights   02/13/20 0141 02/13/20 1719 02/14/20 0500  Weight: 67.6 kg 71.8 kg 73.6 kg    Examination:  Physical Exam Vitals and nursing note reviewed. Exam conducted with a chaperone present.  Constitutional:      Appearance: Normal appearance.  HENT:     Head: Normocephalic and atraumatic.  Eyes:     Conjunctiva/sclera: Conjunctivae normal.  Cardiovascular:     Rate and Rhythm: Normal rate and regular rhythm.     Heart sounds: Murmur heard.  Systolic murmur is present with a grade of 3/6.   Pulmonary:     Effort: Pulmonary effort is normal.     Breath sounds: Normal breath  sounds.  Abdominal:     General: Abdomen is flat.     Palpations: Abdomen is soft.  Musculoskeletal:        General: No swelling or tenderness.  Skin:    Coloration: Skin is not jaundiced or pale.  Neurological:     Mental Status: She is alert. Mental status is at baseline.  Psychiatric:        Mood and Affect: Mood normal.        Behavior: Behavior normal.     Data Reviewed: I have personally reviewed following labs and imaging studies  CBC: Recent Labs  Lab 02/13/20 0137 02/14/20 0518 02/14/20 0707  WBC 14.5* 19.1* 20.1*  NEUTROABS  8.9* 11.4*  --   HGB 8.0* 6.7* 7.4*  HCT 26.1* 22.3* 23.5*  MCV 79.6* 80.2 77.3*  PLT 469* 417* 420*   Basic Metabolic Panel: Recent Labs  Lab 02/13/20 0137 02/13/20 0656 02/14/20 0707  NA 130*  --  130*  K 4.2  --  4.3  CL 91*  --  92*  CO2 27  --  27  GLUCOSE 173*  --  52*  BUN 24*  --  45*  CREATININE 1.19*  --  1.51*  CALCIUM 9.9  --  9.5  MG 2.8* 2.1  --    GFR: Estimated Creatinine Clearance: 24.2 mL/min (A) (by C-G formula based on SCr of 1.51 mg/dL (H)). Liver Function Tests: Recent Labs  Lab 02/13/20 0137  AST 48*  ALT 21  ALKPHOS 91  BILITOT 0.5  PROT 7.8  ALBUMIN 3.4*   No results for input(s): LIPASE, AMYLASE in the last 168 hours. No results for input(s): AMMONIA in the last 168 hours. Coagulation Profile: Recent Labs  Lab 02/13/20 0137  INR 1.0   Cardiac Enzymes: No results for input(s): CKTOTAL, CKMB, CKMBINDEX, TROPONINI in the last 168 hours. BNP (last 3 results) No results for input(s): PROBNP in the last 8760 hours. HbA1C: No results for input(s): HGBA1C in the last 72 hours. CBG: Recent Labs  Lab 02/13/20 1224 02/13/20 1736 02/13/20 1756 02/14/20 0857 02/14/20 1149  GLUCAP 251* 198* 209* 103* 146*   Lipid Profile: No results for input(s): CHOL, HDL, LDLCALC, TRIG, CHOLHDL, LDLDIRECT in the last 72 hours. Thyroid Function Tests: Recent Labs    02/13/20 0656  TSH 0.763   Anemia  Panel: Recent Labs    02/14/20 1116  FERRITIN 13  TIBC 454*  IRON 19*   Sepsis Labs: Recent Labs  Lab 02/13/20 0137  PROCALCITON <0.10    Recent Results (from the past 240 hour(s))  SARS CORONAVIRUS 2 (TAT 6-24 HRS) Nasopharyngeal Nasopharyngeal Swab     Status: None   Collection Time: 02/09/20  3:43 PM   Specimen: Nasopharyngeal Swab  Result Value Ref Range Status   SARS Coronavirus 2 NEGATIVE NEGATIVE Final    Comment: (NOTE) SARS-CoV-2 target nucleic acids are NOT DETECTED.  The SARS-CoV-2 RNA is generally detectable in upper and lower respiratory specimens during the acute phase of infection. Negative results do not preclude SARS-CoV-2 infection, do not rule out co-infections with other pathogens, and should not be used as the sole basis for treatment or other patient management decisions. Negative results must be combined with clinical observations, patient history, and epidemiological information. The expected result is Negative.  Fact Sheet for Patients: HairSlick.no  Fact Sheet for Healthcare Providers: quierodirigir.com  This test is not yet approved or cleared by the [place] FDA and  has been authorized for detection and/or diagnosis of SARS-CoV-2 by FDA under an Emergency Use Authorization (EUA). This EUA will remain  in effect (meaning this test can be used) for the duration of the COVID-19 declaration under Se ction 564(b)(1) of the Act, 21 U.S.C. section 360bbb-3(b)(1), unless the authorization is terminated or revoked sooner.  Performed at [facility], [address] [place], [place] [address]   SARS Coronavirus 2 by RT PCR (hospital order, performed in [facility] hospital lab) Nasopharyngeal Nasopharyngeal Swab     Status: None   Collection Time: 02/13/20  1:37 AM   Specimen: Nasopharyngeal Swab  Result Value Ref Range Status   SARS Coronavirus 2 NEGATIVE NEGATIVE Final  Comment: (NOTE) SARS-CoV-2 target nucleic acids are NOT DETECTED.  The SARS-CoV-2 RNA is generally detectable in upper and lower respiratory specimens during the acute phase of infection. The lowest concentration of SARS-CoV-2 viral copies this assay can detect is 250 copies / mL. A negative result does not preclude SARS-CoV-2 infection and should not be used as the sole basis for treatment or other patient management decisions.  A negative result may occur with improper specimen collection / handling, submission of specimen other than nasopharyngeal swab, presence of viral mutation(s) within the areas targeted by this assay, and inadequate number of viral copies (<250 copies / mL). A negative result must be combined with clinical observations, patient history, and epidemiological information.  Fact Sheet for Patients:   BoilerBrush.com.cy  Fact Sheet for Healthcare Providers: https://pope.com/  This test is not yet approved or  cleared by the [place] FDA and has been authorized for detection and/or diagnosis of SARS-CoV-2 by FDA under an Emergency Use Authorization (EUA).  This EUA will remain in effect (meaning this test can be used) for the duration of the COVID-19 declaration under Section 564(b)(1) of the Act, 21 U.S.C. section 360bbb-3(b)(1), unless the authorization is terminated or revoked sooner.  Performed at [facility], [address] [place], [place] [address]          Radiology Studies: DG Chest Portable 1 View  Result Date: 02/13/2020 CLINICAL DATA:  Dyspnea EXAM: PORTABLE CHEST 1 VIEW COMPARISON:  04/28/2013 FINDINGS: Diffuse interstitial opacity, worst in the lower lobes. No pneumothorax or sizable pleural effusion. IMPRESSION: Diffuse interstitial opacity, worst in the lower lobes, likely indicating pulmonary edema. Electronically Signed   By: Deatra Robinson M.D.   On: 02/13/2020 02:09    ECHOCARDIOGRAM COMPLETE  Result Date: 02/13/2020    ECHOCARDIOGRAM REPORT   Patient Name:   Hannah Conway Date of Exam: 02/13/2020 Medical Rec #:  [id number]     Height:       62.0 in Accession #:    [id number]    Weight:       149.0 lb Date of Birth:  1929-11-18     BSA:          1.687 m Patient Age:    89 years      BP:           149/53 mmHg Patient Gender: F             HR:           81 bpm. Exam Location:  [facility] Procedure: Color Doppler, Cardiac Doppler, 2D Echo and Intracardiac            Opacification Agent Indications:     NSTEMI 121.4  History:         Patient has no prior history of Echocardiogram examinations.                  Risk Factors:Hypertension and Diabetes. PAD.  Sonographer:     Cristela Blue RDCS (AE) Referring Phys:  [id number] Vernetta Honey MANSY Diagnosing Phys: Cristal Deer End MD  Sonographer Comments: No subcostal window, suboptimal apical window and suboptimal parasternal window. Image acquisition challenging due to COPD. IMPRESSIONS  1. Left ventricular ejection fraction, by estimation, is 45 to 50%. The left ventricle has mildly decreased function. The left ventricle demonstrates regional wall motion abnormalities (see scoring diagram/findings for description). There is moderate left ventricular hypertrophy. Left ventricular diastolic parameters are consistent with Grade II diastolic dysfunction (pseudonormalization). Elevated left atrial  pressure. There is severe hypokinesis of the left ventricular, entire anterolateral wall and inferolateral wall.  2. Right ventricular systolic function is normal. The right ventricular size is normal. Tricuspid regurgitation signal is inadequate for assessing PA pressure.  3. Left atrial size was mildly dilated.  4. The mitral valve is grossly normal. Moderate mitral valve regurgitation. No evidence of mitral stenosis.  5. Unable to determine valve morphology due to image quality. Aortic valve regurgitation is moderate. Severe aortic valve stenosis. Aortic valve  mean gradient measures 40.0 mmHg. Aortic valve Vmax measures 3.98 m/s.  6. Pulmonic valve regurgitation not well assessed. FINDINGS  Left Ventricle: Left ventricular ejection fraction, by estimation, is 45 to 50%. The left ventricle has mildly decreased function. The left ventricle demonstrates regional wall motion abnormalities. Severe hypokinesis of the left ventricular, entire anterolateral wall and inferolateral wall. Definity contrast agent was given IV to delineate the left ventricular endocardial borders. The left ventricular internal cavity size was normal in size. There is moderate left ventricular hypertrophy. Left ventricular diastolic parameters are consistent with Grade II diastolic dysfunction (pseudonormalization). Elevated left atrial pressure. Right Ventricle: The right ventricular size is normal. Right vetricular wall thickness was not assessed. Right ventricular systolic function is normal. Tricuspid regurgitation signal is inadequate for assessing PA pressure. Left Atrium: Left atrial size was mildly dilated. Right Atrium: Right atrial size was normal in size. Pericardium: The pericardium was not well visualized. Mitral Valve: The mitral valve is grossly normal. Moderate mitral annular calcification. Moderate mitral valve regurgitation. No evidence of mitral valve stenosis. Tricuspid Valve: The tricuspid valve is not well visualized. Tricuspid valve regurgitation is trivial. Aortic Valve: Unable to determine valve morphology due to image quality.. There is severe thickening and mild calcification of the aortic valve. Aortic valve regurgitation is moderate. Severe aortic stenosis is present. There is severe thickening of the aortic valve. There is mild calcification of the aortic valve. Aortic valve mean gradient measures 40.0 mmHg. Aortic valve peak gradient measures 63.4 mmHg. Aortic valve area, by VTI measures 0.65 cm. Pulmonic Valve: The pulmonic valve was not well visualized. Pulmonic valve  regurgitation not well assessed. Aorta: The aortic root is normal in size and structure. Pulmonary Artery: The pulmonary artery is not well seen. Venous: The inferior vena cava was not well visualized. IAS/Shunts: The interatrial septum was not well visualized.  LEFT VENTRICLE PLAX 2D LVIDd:         3.50 cm      Diastology LVIDs:         2.47 cm      LV e' lateral:   3.92 cm/s LV PW:         1.22 cm      LV E/e' lateral: 37.0 LV IVS:        1.05 cm      LV e' medial:    4.57 cm/s LVOT diam:     2.00 cm      LV E/e' medial:  31.7 LV SV:         57 LV SV Index:   34 LVOT Area:     3.14 cm  LV Volumes (MOD) LV vol d, MOD A2C: 96.8 ml LV vol d, MOD A4C: 110.0 ml LV vol s, MOD A2C: 48.3 ml LV vol s, MOD A4C: 57.4 ml LV SV MOD A2C:     48.5 ml LV SV MOD A4C:     110.0 ml LV SV MOD BP:      50.5 ml RIGHT  VENTRICLE RV Basal diam:  2.67 cm RV S prime:     13.60 cm/s TAPSE (M-mode): 2.5 cm LEFT ATRIUM             Index       RIGHT ATRIUM           Index LA diam:        4.40 cm 2.61 cm/m  RA Area:     13.20 cm LA Vol (A2C):   82.9 ml 49.14 ml/m RA Volume:   30.10 ml  17.84 ml/m LA Vol (A4C):   49.6 ml 29.40 ml/m LA Biplane Vol: 64.4 ml 38.17 ml/m  AORTIC VALVE                    PULMONIC VALVE AV Area (Vmax):    0.69 cm     RVOT Peak grad: 6 mmHg AV Area (Vmean):   0.60 cm AV Area (VTI):     0.65 cm AV Vmax:           398.00 cm/s AV Vmean:          283.333 cm/s AV VTI:            0.889 m AV Peak Grad:      63.4 mmHg AV Mean Grad:      40.0 mmHg LVOT Vmax:         87.50 cm/s LVOT Vmean:        53.800 cm/s LVOT VTI:          0.183 m LVOT/AV VTI ratio: 0.21  AORTA Ao Root diam: 2.80 cm MITRAL VALVE MV Area (PHT): 3.30 cm     SHUNTS MV Decel Time: 230 msec     Systemic VTI:  0.18 m MV E velocity: 145.00 cm/s  Systemic Diam: 2.00 cm MV A velocity: 119.00 cm/s MV E/A ratio:  1.22 Cristal Deer End MD Electronically signed by Yvonne Kendall MD Signature Date/Time: 02/13/2020/10:47:14 AM    Final         Scheduled  Meds: . sodium chloride   Intravenous Once  . aspirin EC  81 mg Oral Daily  . benazepril  20 mg Oral Daily   And  . hydrochlorothiazide  12.5 mg Oral Daily  . buPROPion  150 mg Oral Daily  . calcium-vitamin D  1 tablet Oral Q lunch  . clopidogrel  75 mg Oral Daily  . furosemide  20 mg Intravenous Once  . furosemide  40 mg Intravenous Q12H  . insulin aspart  0-9 Units Subcutaneous TID WC  . insulin aspart protamine- aspart  35 Units Subcutaneous Q supper  . insulin aspart protamine- aspart  55 Units Subcutaneous Q breakfast  . multivitamin with minerals  1 tablet Oral Daily  . omega-3 acid ethyl esters  1 g Oral Daily  . pravastatin  40 mg Oral q1800  . sodium chloride flush  3 mL Intravenous Q12H   Continuous Infusions: . sodium chloride    . heparin 900 Units/hr (02/14/20 0405)     Time spent: 36 minutes with over 50% of the time coordinating the patient's care    Jae Dire, DO [facility] Pager [phone]  Call night coverage person covering after 7pm

## 2020-02-15 DIAGNOSIS — D649 Anemia, unspecified: Secondary | ICD-10-CM

## 2020-02-15 DIAGNOSIS — R062 Wheezing: Secondary | ICD-10-CM

## 2020-02-15 LAB — BASIC METABOLIC PANEL
Anion gap: 11 (ref 5–15)
BUN: 48 mg/dL — ABNORMAL HIGH (ref 8–23)
CO2: 27 mmol/L (ref 22–32)
Calcium: 9.3 mg/dL (ref 8.9–10.3)
Chloride: 91 mmol/L — ABNORMAL LOW (ref 98–111)
Creatinine, Ser: 1.52 mg/dL — ABNORMAL HIGH (ref 0.44–1.00)
GFR calc Af Amer: 35 mL/min — ABNORMAL LOW (ref >60)
GFR calc non Af Amer: 30 mL/min — ABNORMAL LOW (ref >60)
Glucose, Bld: 175 mg/dL — ABNORMAL HIGH (ref 70–99)
Potassium: 4.3 mmol/L (ref 3.5–5.1)
Sodium: 129 mmol/L — ABNORMAL LOW (ref 135–145)

## 2020-02-15 LAB — CBC WITH DIFFERENTIAL/PLATELET
Abs Immature Granulocytes: 0.11 10*3/uL — ABNORMAL HIGH (ref 0.00–0.07)
Basophils Absolute: 0.1 10*3/uL (ref 0.0–0.1)
Basophils Relative: 1 %
Eosinophils Absolute: 0.4 10*3/uL (ref 0.0–0.5)
Eosinophils Relative: 3 %
HCT: 28.1 % — ABNORMAL LOW (ref 36.0–46.0)
Hemoglobin: 8.5 g/dL — ABNORMAL LOW (ref 12.0–15.0)
Immature Granulocytes: 1 %
Lymphocytes Relative: 26 %
Lymphs Abs: 4.1 10*3/uL — ABNORMAL HIGH (ref 0.7–4.0)
MCH: 24.6 pg — ABNORMAL LOW (ref 26.0–34.0)
MCHC: 30.2 g/dL (ref 30.0–36.0)
MCV: 81.2 fL (ref 80.0–100.0)
Monocytes Absolute: 1.6 10*3/uL — ABNORMAL HIGH (ref 0.1–1.0)
Monocytes Relative: 10 %
Neutro Abs: 9.4 10*3/uL — ABNORMAL HIGH (ref 1.7–7.7)
Neutrophils Relative %: 59 %
Platelets: 409 10*3/uL — ABNORMAL HIGH (ref 150–400)
RBC: 3.46 MIL/uL — ABNORMAL LOW (ref 3.87–5.11)
RDW: 16 % — ABNORMAL HIGH (ref 11.5–15.5)
WBC: 15.7 10*3/uL — ABNORMAL HIGH (ref 4.0–10.5)
nRBC: 0 % (ref 0.0–0.2)

## 2020-02-15 LAB — TYPE AND SCREEN
ABO/RH(D): O POS
Antibody Screen: NEGATIVE
Unit division: 0

## 2020-02-15 LAB — BPAM RBC
Blood Product Expiration Date: 202109202359
ISSUE DATE / TIME: 202108181544
Unit Type and Rh: 5100

## 2020-02-15 LAB — GLUCOSE, CAPILLARY
Glucose-Capillary: 148 mg/dL — ABNORMAL HIGH (ref 70–99)
Glucose-Capillary: 254 mg/dL — ABNORMAL HIGH (ref 70–99)
Glucose-Capillary: 139 mg/dL — ABNORMAL HIGH (ref 70–99)
Glucose-Capillary: 232 mg/dL — ABNORMAL HIGH (ref 70–99)

## 2020-02-15 LAB — OSMOLALITY: Osmolality: 287 mOsm/kg (ref 275–295)

## 2020-02-15 LAB — FOLATE: Folate: 16.1 ng/mL (ref >5.9)

## 2020-02-15 LAB — VITAMIN B12: Vitamin B-12: 460 pg/mL (ref 180–914)

## 2020-02-15 LAB — MAGNESIUM: Magnesium: 1.9 mg/dL (ref 1.7–2.4)

## 2020-02-15 LAB — HEPARIN LEVEL (UNFRACTIONATED): Heparin Unfractionated: 0.39 IU/mL (ref 0.30–0.70)

## 2020-02-15 MED ORDER — IPRATROPIUM-ALBUTEROL 0.5-2.5 (3) MG/3ML IN SOLN
3.0000 mL | RESPIRATORY_TRACT | Status: DC | PRN
  Administered 2020-02-15 – 2020-02-16 (×2): 3 mL via RESPIRATORY_TRACT
  Filled 2020-02-15 (×2): qty 3

## 2020-02-15 MED ORDER — METOPROLOL TARTRATE 25 MG PO TABS
12.5000 mg | ORAL_TABLET | Freq: Two times a day (BID) | ORAL | Status: DC
  Administered 2020-02-15 – 2020-02-22 (×12): 12.5 mg via ORAL
  Filled 2020-02-15 (×15): qty 1

## 2020-02-15 MED ORDER — ALBUTEROL SULFATE (2.5 MG/3ML) 0.083% IN NEBU
INHALATION_SOLUTION | RESPIRATORY_TRACT | Status: AC
  Filled 2020-02-15: qty 3

## 2020-02-15 NOTE — Progress Notes (Signed)
[facility]    SUBJECTIVE: Patient resting comfortably in bed feels better than yesterday sleeping no chest pain shortness of breath is improved denies palpitations or tachycardia no nausea   Vitals:   02/14/20 1613 02/14/20 1854 02/14/20 1935 02/15/20 0455  BP: (!) 139/59 120/60 113/64 (!) 124/59  Pulse: 86 80 77 82  Resp: (!) 22 16 (!) 24 (!) 24  Temp: 98.3 F (36.8 C) 98.9 F (37.2 C) 97.9 F (36.6 C) 98.2 F (36.8 C)  TempSrc: Oral Oral Oral Oral  SpO2: 99% 99% 98% 99%  Weight:      Height:         Intake/Output Summary (Last 24 hours) at 02/15/2020 0622 Last data filed at 02/14/2020 2300 Gross per 24 hour  Intake 859 ml  Output 900 ml  Net -41 ml      PHYSICAL EXAM  General: Well developed, well nourished, in no acute distress HEENT:  Normocephalic and atramatic Neck:  No JVD.  Lungs: Clear bilaterally to auscultation and percussion. Heart: HRRR . Normal S1 and S2 without gallops or murmurs.  Abdomen: Bowel sounds are positive, abdomen soft and non-tender  Msk:  Back normal, normal gait. Normal strength and tone for age. Extremities: No clubbing, cyanosis or edema.   Neuro: Alert and oriented X 3. Psych:  Good affect, responds appropriately   LABS: Basic Metabolic Panel: Recent Labs    02/13/20 0137 02/13/20 0656 02/14/20 0707 02/15/20 0411  NA   < >  --  130* 129*  K   < >  --  4.3 4.3  CL   < >  --  92* 91*  CO2   < >  --  27 27  GLUCOSE   < >  --  52* 175*  BUN   < >  --  45* 48*  CREATININE   < >  --  1.51* 1.52*  CALCIUM   < >  --  9.5 9.3  MG  --  2.1  --  1.9   < > = values in this interval not displayed.   Liver Function Tests: Recent Labs    02/13/20 0137  AST 48*  ALT 21  ALKPHOS 91  BILITOT 0.5  PROT 7.8  ALBUMIN 3.4*   No results for input(s): LIPASE, AMYLASE in the last 72 hours. CBC: Recent Labs    02/14/20 0518 02/14/20 0518 02/14/20 0707 02/14/20 0707 02/14/20 2033 02/15/20 0411  WBC 19.1*   < > 20.1*  --    --  15.7*  NEUTROABS 11.4*  --   --   --   --  9.4*  HGB 6.7*   < > 7.4*   < > 7.9* 8.5*  HCT 22.3*   < > 23.5*   < > 25.5* 28.1*  MCV 80.2   < > 77.3*  --   --  81.2  PLT 417*   < > 420*  --   --  409*   < > = values in this interval not displayed.   Cardiac Enzymes: No results for input(s): CKTOTAL, CKMB, CKMBINDEX, TROPONINI in the last 72 hours. BNP: Invalid input(s): POCBNP D-Dimer: No results for input(s): DDIMER in the last 72 hours. Hemoglobin A1C: No results for input(s): HGBA1C in the last 72 hours. Fasting Lipid Panel: Recent Labs    02/14/20 0707  CHOL 114  HDL 64  LDLCALC 39  TRIG 57  CHOLHDL 1.8   Thyroid Function Tests: Recent Labs    02/13/20  0656  TSH 0.763   Anemia Panel: Recent Labs    02/14/20 1116  FERRITIN 13  TIBC 454*  IRON 19*    ECHOCARDIOGRAM COMPLETE  Result Date: 02/13/2020    ECHOCARDIOGRAM REPORT   Patient Name:   MARIANY MACKINTOSH Date of Exam: 02/13/2020 Medical Rec #:  [id number]     Height:       62.0 in Accession #:    [id number]    Weight:       149.0 lb Date of Birth:  04/01/1930     BSA:          1.687 m Patient Age:    89 years      BP:           149/53 mmHg Patient Gender: F             HR:           81 bpm. Exam Location:  [facility] Procedure: Color Doppler, Cardiac Doppler, 2D Echo and Intracardiac            Opacification Agent Indications:     NSTEMI 121.4  History:         Patient has no prior history of Echocardiogram examinations.                  Risk Factors:Hypertension and Diabetes. PAD.  Sonographer:     Cristela Blue RDCS (AE) Referring Phys:  [id number] Vernetta Honey MANSY Diagnosing Phys: Cristal Deer End MD  Sonographer Comments: No subcostal window, suboptimal apical window and suboptimal parasternal window. Image acquisition challenging due to COPD. IMPRESSIONS  1. Left ventricular ejection fraction, by estimation, is 45 to 50%. The left ventricle has mildly decreased function. The left ventricle demonstrates regional wall motion  abnormalities (see scoring diagram/findings for description). There is moderate left ventricular hypertrophy. Left ventricular diastolic parameters are consistent with Grade II diastolic dysfunction (pseudonormalization). Elevated left atrial pressure. There is severe hypokinesis of the left ventricular, entire anterolateral wall and inferolateral wall.  2. Right ventricular systolic function is normal. The right ventricular size is normal. Tricuspid regurgitation signal is inadequate for assessing PA pressure.  3. Left atrial size was mildly dilated.  4. The mitral valve is grossly normal. Moderate mitral valve regurgitation. No evidence of mitral stenosis.  5. Unable to determine valve morphology due to image quality. Aortic valve regurgitation is moderate. Severe aortic valve stenosis. Aortic valve mean gradient measures 40.0 mmHg. Aortic valve Vmax measures 3.98 m/s.  6. Pulmonic valve regurgitation not well assessed. FINDINGS  Left Ventricle: Left ventricular ejection fraction, by estimation, is 45 to 50%. The left ventricle has mildly decreased function. The left ventricle demonstrates regional wall motion abnormalities. Severe hypokinesis of the left ventricular, entire anterolateral wall and inferolateral wall. Definity contrast agent was given IV to delineate the left ventricular endocardial borders. The left ventricular internal cavity size was normal in size. There is moderate left ventricular hypertrophy. Left ventricular diastolic parameters are consistent with Grade II diastolic dysfunction (pseudonormalization). Elevated left atrial pressure. Right Ventricle: The right ventricular size is normal. Right vetricular wall thickness was not assessed. Right ventricular systolic function is normal. Tricuspid regurgitation signal is inadequate for assessing PA pressure. Left Atrium: Left atrial size was mildly dilated. Right Atrium: Right atrial size was normal in size. Pericardium: The pericardium was not  well visualized. Mitral Valve: The mitral valve is grossly normal. Moderate mitral annular calcification. Moderate mitral valve regurgitation. No evidence of mitral valve stenosis. Tricuspid Valve: The  tricuspid valve is not well visualized. Tricuspid valve regurgitation is trivial. Aortic Valve: Unable to determine valve morphology due to image quality.. There is severe thickening and mild calcification of the aortic valve. Aortic valve regurgitation is moderate. Severe aortic stenosis is present. There is severe thickening of the aortic valve. There is mild calcification of the aortic valve. Aortic valve mean gradient measures 40.0 mmHg. Aortic valve peak gradient measures 63.4 mmHg. Aortic valve area, by VTI measures 0.65 cm. Pulmonic Valve: The pulmonic valve was not well visualized. Pulmonic valve regurgitation not well assessed. Aorta: The aortic root is normal in size and structure. Pulmonary Artery: The pulmonary artery is not well seen. Venous: The inferior vena cava was not well visualized. IAS/Shunts: The interatrial septum was not well visualized.  LEFT VENTRICLE PLAX 2D LVIDd:         3.50 cm      Diastology LVIDs:         2.47 cm      LV e' lateral:   3.92 cm/s LV PW:         1.22 cm      LV E/e' lateral: 37.0 LV IVS:        1.05 cm      LV e' medial:    4.57 cm/s LVOT diam:     2.00 cm      LV E/e' medial:  31.7 LV SV:         57 LV SV Index:   34 LVOT Area:     3.14 cm  LV Volumes (MOD) LV vol d, MOD A2C: 96.8 ml LV vol d, MOD A4C: 110.0 ml LV vol s, MOD A2C: 48.3 ml LV vol s, MOD A4C: 57.4 ml LV SV MOD A2C:     48.5 ml LV SV MOD A4C:     110.0 ml LV SV MOD BP:      50.5 ml RIGHT VENTRICLE RV Basal diam:  2.67 cm RV S prime:     13.60 cm/s TAPSE (M-mode): 2.5 cm LEFT ATRIUM             Index       RIGHT ATRIUM           Index LA diam:        4.40 cm 2.61 cm/m  RA Area:     13.20 cm LA Vol (A2C):   82.9 ml 49.14 ml/m RA Volume:   30.10 ml  17.84 ml/m LA Vol (A4C):   49.6 ml 29.40 ml/m LA  Biplane Vol: 64.4 ml 38.17 ml/m  AORTIC VALVE                    PULMONIC VALVE AV Area (Vmax):    0.69 cm     RVOT Peak grad: 6 mmHg AV Area (Vmean):   0.60 cm AV Area (VTI):     0.65 cm AV Vmax:           398.00 cm/s AV Vmean:          283.333 cm/s AV VTI:            0.889 m AV Peak Grad:      63.4 mmHg AV Mean Grad:      40.0 mmHg LVOT Vmax:         87.50 cm/s LVOT Vmean:        53.800 cm/s LVOT VTI:          0.183 m LVOT/AV VTI ratio: 0.21  AORTA Ao Root diam: 2.80 cm MITRAL VALVE MV Area (PHT): 3.30 cm     SHUNTS MV Decel Time: 230 msec     Systemic VTI:  0.18 m MV E velocity: 145.00 cm/s  Systemic Diam: 2.00 cm MV A velocity: 119.00 cm/s MV E/A ratio:  1.22 Christopher End MD Electronically signed by Yvonne Kendallhristopher End MD Signature Date/Time: 02/13/2020/10:47:14 AM    Final      Echo moderate reduced left ventricular function EF between 45 to 50%  TELEMETRY: Normal sinus rhythm nonspecific T2 changes  ASSESSMENT AND PLAN:  Active Problems:   NSTEMI (non-ST elevated myocardial infarction) (HCC) Aortic stenosis moderate to severe Shortness of breath dyspnea Generalized weakness Obesity Elevated white count Acute on chronic combined systolic and diastolic heart failure Diabetes type 2 uncomplicated Peripheral vascular disease Anemia iron deficiency Acute on chronic renal insufficiency  Plan Continue telemetry Follow-up EKG and troponins Continue heparin therapy for anticoagulation short-term Echocardiogram with significant aortic stenosis Consider cardiac cath prior to discharge for evaluation of possible non-STEMI as well as aortic stenosis Consider heart failure therapy Agree with transfusion to hemoglobin of at least 7 or 8 Continue on recommend GI work-up for possible source of anemia Consider nephrology input for acute on chronic renal insufficiency Patient is not stable enough for cardiac cath at this stage would continue aggressive medical management and consider cardiac  cath prior to discharge   Alwyn Peawayne D Brinlynn Gorton, MD 02/15/2020 6:22 AM

## 2020-02-15 NOTE — Progress Notes (Signed)
ANTICOAGULATION CONSULT NOTE  Pharmacy Consult for Heparin Indication: chest pain/ACS  Allergies  Allergen Reactions  . Atorvastatin Other (See Comments)    "Felt as if she would pass out"  . Simvastatin Other (See Comments)    "Felt as if she would pass out"     Patient Measurements: Height: 5\' 2"  (157.5 cm) Weight: 73.6 kg (162 lb 4.8 oz) IBW/kg (Calculated) : 50.1 HEPARIN DW (KG): 65.4  Vital Signs: Temp: 98.2 F (36.8 C) (08/19 0455) Temp Source: Oral (08/19 0455) BP: 124/59 (08/19 0455) Pulse Rate: 82 (08/19 0455)  Labs: Recent Labs    02/13/20 0137 02/13/20 0137 02/13/20 0434 02/13/20 1241 02/14/20 0518 02/14/20 0518 02/14/20 0707 02/14/20 0707 02/14/20 1116 02/14/20 1810 02/14/20 2033 02/15/20 0411  HGB 8.0*   < >  --   --  6.7*   < > 7.4*   < >  --   --  7.9* 8.5*  HCT 26.1*   < >  --   --  22.3*   < > 23.5*  --   --   --  25.5* 28.1*  PLT 469*   < >  --   --  417*  --  420*  --   --   --   --  409*  APTT 35  --   --   --   --   --   --   --   --   --   --   --   LABPROT 12.9  --   --   --   --   --   --   --   --   --   --   --   INR 1.0  --   --   --   --   --   --   --   --   --   --   --   HEPARINUNFRC  --   --   --    < > 0.34  --   --   --  0.33  --   --  0.39  CREATININE 1.19*  --   --   --   --   --  1.51*  --   --   --   --   --   TROPONINIHS 2,180*   < > 1,848*  --   --   --   --   --   --  1,392* 1,517*  --    < > = values in this interval not displayed.    Estimated Creatinine Clearance: 24.2 mL/min (A) (by C-G formula based on SCr of 1.51 mg/dL (H)).  Medical History: Past Medical History:  Diagnosis Date  . Diabetes mellitus without complication (HCC)   . Hypertension   . Peripheral artery disease (HCC)     Medications:  Medications Prior to Admission  Medication Sig Dispense Refill Last Dose  . acetaminophen (TYLENOL) 500 MG tablet Take 1,000 mg by mouth every 6 (six) hours as needed for moderate pain.    02/12/2020 at 2000  .  aspirin EC 81 MG tablet Take 81 mg by mouth daily.    02/12/2020 at 1000  . benazepril-hydrochlorthiazide (LOTENSIN HCT) 20-12.5 MG tablet Take 1 tablet by mouth daily.   02/12/2020 at 1000  . buPROPion (WELLBUTRIN XL) 150 MG 24 hr tablet Take 150 mg by mouth daily.   02/12/2020 at 1000  . Calcium Citrate-Vitamin D (CALCIUM CITRATE + D3 PO) Take 2  tablets by mouth daily with lunch.   02/12/2020 at 1800  . Cinnamon 500 MG TABS Take 500 mg by mouth daily.   02/12/2020 at 1000  . clopidogrel (PLAVIX) 75 MG tablet Take 1 tablet by mouth once daily with breakfast (Patient taking differently: Take 75 mg by mouth daily. ) 90 tablet 0 02/12/2020 at 1000  . Multiple Vitamin (MULTIVITAMIN WITH MINERALS) TABS tablet Take 1 tablet by mouth daily. Complete Multivitamin   02/12/2020 at 1000  . NOVOLOG MIX 70/30 FLEXPEN (70-30) 100 UNIT/ML FlexPen Inject 35-55 Units into the skin See admin instructions. Inject 55 units subcutaneously in the morning and 35 in the evening  1 02/12/2020 at 1800  . Omega-3 Fatty Acids (FISH OIL) 1000 MG CAPS Take 1,000 mg by mouth 2 (two) times daily.   02/12/2020 at 2000  . pravastatin (PRAVACHOL) 40 MG tablet Take 40 mg by mouth daily.   02/12/2020 at 2000  . oxyCODONE (OXY IR/ROXICODONE) 5 MG immediate release tablet Take 1 tablet (5 mg total) by mouth every 6 (six) hours as needed for moderate pain or severe pain. (Patient not taking: Reported on 01/05/2019) 20 tablet 0     Assessment: Heparin initiated for ACS/STEMI.  No anticoagulants PTA per med list. Baseline CBC with Hgb of 8 which appears c/w patient's baseline. Baseline aPTT, PT-INR WNL.   8/18 0518 HL 0.34  8/18 1116 HL 0.32 8/19 0411 HL 0.39  Goal of Therapy:  Heparin level 0.3-0.7 units/ml Monitor platelets by anticoagulation protocol: Yes   Plan:  Heparin level is therapeutic x 3. Hgb 7.9 > 8.5, PLTs stable.  Will continue heparin infusion at 900 units/hr. Recheck heparin level and CBC with AM labs.    Wayland Denis,  PharmD 02/15/2020,5:41 AM

## 2020-02-15 NOTE — Consult Note (Signed)
Wyline Mood , MD [address], [place], [place], [address] [address], [place], [place], [address] Phone: [phone]  Fax: [phone]  Consultation  Referring Provider:   Dr Dairl Ponder Primary Care Physician:  Mickey Farber, MD Primary Gastroenterologist:  Dr. Marva Panda (2014)         Reason for Consultation:     Anemia   Date of Admission:  02/13/2020 Date of Consultation:  02/15/2020         HPI:   Hannah Conway is a 86 y.o. female with a history of hypertension diabetes admitted to the ER on 02/13/2020 with a 4-day history of shortness of breath.  Diagnosed with a non-ST elevated myocardial infarction, acute congestive heart failure.  On aspirin Plavix and IV heparin.  Found to have an elevated BNP.   Review of labs hemoglobin on admission was 8 g with an MCV of 79.6.  1 year back hemoglobin was 11 g with an MCV of 94.1.  No elevation in BUN/creatinine ratio creatinine noted at 1.19.  Hemoglobin is 8.5 g this morning.  Ferritin of 13.  Echo shows moderate left ventricular hypertrophy grade 2 diastolic dysfunction severe hypokinesia of the left ventricular anterolateral wall.  Severe aortic valve stenosis.  I have been asked to see her for dark stool.  Past Medical History:  Diagnosis Date  . Diabetes mellitus without complication (HCC)   . Hypertension   . Peripheral artery disease [facility])     Past Surgical History:  Procedure Laterality Date  . ABDOMINAL HYSTERECTOMY     86 y.o. at time  . CHOLECYSTECTOMY  1965  . JOINT REPLACEMENT  2016   Bilat. Hip Replacements   . LOWER EXTREMITY ANGIOGRAPHY Right 08/09/2018   Procedure: LOWER EXTREMITY ANGIOGRAPHY;  Surgeon: Renford Dills, MD;  Location: [facility] INVASIVE CV LAB;  Service: Cardiovascular;  Laterality: Right;  . LOWER EXTREMITY ANGIOGRAPHY Right 01/13/2019   Procedure: LOWER EXTREMITY ANGIOGRAPHY;  Surgeon: Renford Dills, MD;  Location: [facility] INVASIVE CV LAB;  Service: Cardiovascular;  Laterality:  Right;    Prior to Admission medications   Medication Sig Start Date End Date Taking? Authorizing Provider  acetaminophen (TYLENOL) 500 MG tablet Take 1,000 mg by mouth every 6 (six) hours as needed for moderate pain.    Yes [provider]  aspirin EC 81 MG tablet Take 81 mg by mouth daily.    Yes [provider]  benazepril-hydrochlorthiazide (LOTENSIN HCT) 20-12.5 MG tablet Take 1 tablet by mouth daily. 11/27/17  Yes [provider]  buPROPion (WELLBUTRIN XL) 150 MG 24 hr tablet Take 150 mg by mouth daily.   Yes [provider]  Calcium Citrate-Vitamin D (CALCIUM CITRATE + D3 PO) Take 2 tablets by mouth daily with lunch.   Yes [provider]  Cinnamon 500 MG TABS Take 500 mg by mouth daily.   Yes [provider]  clopidogrel (PLAVIX) 75 MG tablet Take 1 tablet by mouth once daily with breakfast Patient taking differently: Take 75 mg by mouth daily.  02/07/20  Yes Georgiana Spinner, NP  Multiple Vitamin (MULTIVITAMIN WITH MINERALS) TABS tablet Take 1 tablet by mouth daily. Complete Multivitamin   Yes [provider]  NOVOLOG MIX 70/30 FLEXPEN (70-30) 100 UNIT/ML FlexPen Inject 35-55 Units into the skin See admin instructions. Inject 55 units subcutaneously in the morning and 35 in the evening 02/01/18  Yes [provider]  Omega-3 Fatty Acids (FISH OIL) 1000 MG CAPS Take 1,000  mg by mouth 2 (two) times daily.   Yes [provider]  pravastatin (PRAVACHOL) 40 MG tablet Take 40 mg by mouth daily. 01/23/20  Yes [provider]  oxyCODONE (OXY IR/ROXICODONE) 5 MG immediate release tablet Take 1 tablet (5 mg total) by mouth every 6 (six) hours as needed for moderate pain or severe pain. Patient not taking: Reported on 01/05/2019 08/10/18   Annice Needy, MD    Family History  Problem Relation Age of Onset  . Congestive Heart Failure Mother   . Diabetes Father   . Diabetes Sister      Social History   Tobacco Use   . Smoking status: Current Every Day Smoker    Packs/day: 1.00    Years: 30.00    Pack years: 30.00    Types: Cigarettes  . Smokeless tobacco: Never Used  Vaping Use  . Vaping Use: Never used  Substance Use Topics  . Alcohol use: Never  . Drug use: Not Currently    Allergies as of 02/13/2020 - Review Complete 02/13/2020  Allergen Reaction Noted  . Atorvastatin Other (See Comments) 12/13/2013  . Simvastatin Other (See Comments) 12/13/2013    Review of Systems:    All systems reviewed and negative except where noted in HPI.   Physical Exam:  Vital signs in last 24 hours: Temp:  [97.9 F (36.6 C)-98.9 F (37.2 C)] 97.9 F (36.6 C) (08/19 1153) Pulse Rate:  [71-86] 74 (08/19 1153) Resp:  [16-24] 24 (08/19 0455) BP: (98-139)/(59-65) 129/64 (08/19 1153) SpO2:  [98 %-99 %] 99 % (08/19 0455) Last BM Date: 02/15/20 General:   Pleasant, cooperative in NAD Head:  Normocephalic and atraumatic. Eyes:   No icterus.   Conjunctiva pink. PERRLA. Ears:  Normal auditory acuity. Neck:  Supple; no masses or thyroidomegaly Lungs: Respirations even and unlabored. Lungs clear to auscultation bilaterally.   No wheezes, crackles, or rhonchi.  Heart:  Regular rate and rhythm;  Without murmur, clicks, rubs or gallops Abdomen:  Soft, nondistended, nontender. Normal bowel sounds. No appreciable masses or hepatomegaly.  No rebound or guarding.  Neurologic:  Alert and oriented x3;  grossly normal neurologically. Skin:  Intact without significant lesions or rashes. Cervical Nodes:  No significant cervical adenopathy. Psych:  Alert and cooperative. Normal affect.  LAB RESULTS: Recent Labs    02/14/20 0518 02/14/20 0518 02/14/20 0707 02/14/20 2033 02/15/20 0411  WBC 19.1*  --  20.1*  --  15.7*  HGB 6.7*   < > 7.4* 7.9* 8.5*  HCT 22.3*   < > 23.5* 25.5* 28.1*  PLT 417*  --  420*  --  409*   < > = values in this interval not displayed.   BMET Recent Labs    02/13/20 0137 02/14/20 0707  02/15/20 0411  NA 130* 130* 129*  K 4.2 4.3 4.3  CL 91* 92* 91*  CO2 27 27 27   GLUCOSE 173* 52* 175*  BUN 24* 45* 48*  CREATININE 1.19* 1.51* 1.52*  CALCIUM 9.9 9.5 9.3   LFT Recent Labs    02/13/20 0137  PROT 7.8  ALBUMIN 3.4*  AST 48*  ALT 21  ALKPHOS 91  BILITOT 0.5   PT/INR Recent Labs    02/13/20 0137  LABPROT 12.9  INR 1.0    STUDIES: No results found.    Impression / Plan:   Hannah Conway is a 86 y.o. y/o female admitted few days back with a an non-ST elevated MI, heart failure.  Echocardiogram  shows severe aortic stenosis, left ventricular and anterolateral wall severe hypokinesia.  Found to have iron deficiency anemia.  Has been mention of dark stool but no overt bleeding.  She has been on aspirin Plavix and heparin since admission.  I have been asked to evaluate for GI evaluation.  With a iron deficiency anemia it appears that the anemia has been chronic in nature and not significantly acute.  Differentials include Heydes syndrome which is development of AVMs of the GI tract particularly the small bowel in the setting of severe aortic stenosis.  The treatment of it she is usually treatment of the aortic stenosis which often reverses the development of AVMs.  At this point of time in the setting of an acute non-ST elevated MI and heart failure it would be a very high risk to subject her to anesthesia for any endoscopic evaluation especially in the setting that she is not acutely bleeding.  Ideally she will need endoscopy evaluation a few weeks post a cardiac event.  In addition the decision to perform an EGD is made  needs to be discussed with our anesthesia team if they would be happy providing anesthesia in the setting of severe aortic stenosis at this center.  In the meanwhile I would suggest to consider IV iron, long-term use of a PPI.  There is no role in checking stool occult test as it is a test for colon cancer screening which is inappropriate in the setting and  at her age.   Thank you for involving me in the care of this patient.      LOS: 2 days   Wyline Mood, MD  02/15/2020, 1:50 PM

## 2020-02-15 NOTE — Progress Notes (Signed)
Dr Dairl Ponder notified of patient's sodium level is 129.

## 2020-02-15 NOTE — Progress Notes (Addendum)
PROGRESS NOTE    Hannah Conway    Code Status: Full Code  ZOX:[id number] DOB: 10-Oct-1929 DOA: 02/13/2020 LOS: 2 days  PCP: Mickey Farber, MD CC:  Chief Complaint  Patient presents with  . Respiratory Distress       Hospital Summary   This is an 86 year old female with a past medical history of diabetes, hypertension, PVD who presented to the ED on 8/17 with acutely worsening dyspnea and respiratory distress x4 days which had worsened overnight prior to admission.  Also had dry cough, nausea without vomiting.  Denied any chest pain or palpitations  Upon presentation to the emergency room, blood pressure was 149/53 and pulse 70 was 90% on CPAP with otherwise normal vital signs.  Labs revealed VBG with pH 7.42 and bicarbonate of 25.9.  CMP was unremarkable.  BNP was 1085.6.  High-sensitivity troponin I was 2180 and later 1848 with procalcitonin less than 0.1.  CBC showed leukocytosis of 14.5 with anemia with hemoglobin of 8 hematocrit 26.1.  Chest x-ray showed diffuse interstitial opacity worse in the lower lobes likely indicating pulmonary edema., EKG showed sinus rhythm with rate of 85 with PACs, incomplete right bundle branch block and prolonged QT interval with QTC of 503 MS.  The patient was given aspirin as well as 20 mg of IV Lasix, IV heparin bolus and drip.    She was admitted to a progressive bed and cardiology was consulted.  An echo was obtained and showed EF 45 to 50% moderate LVH, grade 2 diastolic dysfunction and severe hypokinesis of left ventricle and entire anterolateral wall and inferior lateral wall as well as moderate MR and severe AS and moderate aortic regurgitation.  8/18: Hb 6.7 -> 7.4 on repeat.  Given 1 unit PRBC transfusion in setting of NSTEMI  A & P   Active Problems:   NSTEMI (non-ST elevated myocardial infarction) (HCC)   1. NSTEMI, stable a. Echo with wall motion and valvular abnormalities as above b. Continue heparin drip c. Add on beta  blocker d. Continue aspirin and Plavix e. Continue telemetry f. Cardio: Continue aggressive medical management and consider cardiac cath prior to discharge  2. Acute on chronic iron deficiency anemia while on heparin a. Had an episode of dark stools today b. Anemia improved after 1 unit PRBCs c. 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 FOBT e. GI consult, discussed with Dr. Tobi Bastos  3. Diastolic heart failure exacerbation, Likely from NSTEMI a. Echo: EF 45 to 50% moderate LVH, grade 2 diastolic dysfunction and severe hypokinesis of left ventricle and entire anterolateral wall and inferior lateral wall as well as moderate MR and severe AS and moderate aortic regurgitation b. Continue diuresis c. Discontinue ACE d. Continue heart failure protocol   4. AKI on CKD 3a, likely multifactorial: Cardiorenal, anemia, hemodynamic changes, ACE inhibitor/HCTZ a. Continue to monitor with changes as above and below b. Discontinue benzapril c. Nephrology consult, discussed with Dr. Thedore Mins  5. Hyponatremia a. Will check sodium studies b. Nephrology consulted  6. Leukocytosis a. Afebrile and no obvious source of infection b. Could be reactive to NSTEMI c. Hold off antibiotics for now  7. Hypertension a. Starting metoprolol b. Hold Benzapril and HCTZ for today  8. Hyperlipidemia, Stable  9. Type 2 diabetes a. Diabetic coordinator on board: DC 70/30 while inpatient and Lantus 25 units nightly b. Continue sensitive sliding scale  10. Depression a. Continue Wellbutrin XL  11. Wheeze a. Duo nebs  added b. Prior smoker, likely underlying COPD   DVT prophylaxis: Heparin   Family Communication: Patient's family at bedside has been updated   Disposition Plan:  Status is: Inpatient  Remains inpatient appropriate because:Unsafe d/c plan, IV treatments appropriate due to intensity of illness or inability to take PO and Inpatient level of  care appropriate due to severity of illness   Dispo: The patient is from: Home              Anticipated d/c is to: TBD              Anticipated d/c date is: 3 days              Patient currently is not medically stable to d/c.          Pressure injury documentation    None  Consultants  Cardiology   Procedures  None  Antibiotics   Anti-infectives (From admission, onward)   None        Subjective   That she is feeling better today compared to yesterday but still having some shortness of breath.  Had a BM today for the first time described as dark but formed.  No other complaints no overnight events.  Objective   Vitals:   02/15/20 0455 02/15/20 0758 02/15/20 1153 02/15/20 1655  BP: (!) 124/59 121/65 129/64 119/61  Pulse: 82 71 74 66  Resp: (!) 24   18  Temp: 98.2 F (36.8 C) 98.4 F (36.9 C) 97.9 F (36.6 C) 97.7 F (36.5 C)  TempSrc: Oral Oral Oral   SpO2: 99%   98%  Weight:      Height:        Intake/Output Summary (Last 24 hours) at 02/15/2020 1753 Last data filed at 02/15/2020 1656 Gross per 24 hour  Intake 1099 ml  Output 1550 ml  Net -451 ml   Filed Weights   02/13/20 0141 02/13/20 1719 02/14/20 0500  Weight: 67.6 kg 71.8 kg 73.6 kg    Examination:  Physical Exam Vitals and nursing note reviewed.  Constitutional:      Appearance: Normal appearance.  HENT:     Head: Normocephalic and atraumatic.  Eyes:     Conjunctiva/sclera: Conjunctivae normal.  Cardiovascular:     Rate and Rhythm: Normal rate.     Heart sounds: Murmur heard.   Pulmonary:     Effort: Pulmonary effort is normal.     Breath sounds: Wheezing present.  Abdominal:     General: Abdomen is flat.     Palpations: Abdomen is soft.  Musculoskeletal:        General: No swelling.  Skin:    Coloration: Skin is not jaundiced or pale.  Neurological:     Mental Status: She is alert. Mental status is at baseline.  Psychiatric:        Mood and Affect: Mood normal.         Behavior: Behavior normal.     Data Reviewed: I have personally reviewed following labs and imaging studies  CBC: Recent Labs  Lab 02/13/20 0137 02/14/20 0518 02/14/20 0707 02/14/20 2033 02/15/20 0411  WBC 14.5* 19.1* 20.1*  --  15.7*  NEUTROABS 8.9* 11.4*  --   --  9.4*  HGB 8.0* 6.7* 7.4* 7.9* 8.5*  HCT 26.1* 22.3* 23.5* 25.5* 28.1*  MCV 79.6* 80.2 77.3*  --  81.2  PLT 469* 417* 420*  --  409*   Basic Metabolic Panel: Recent Labs  Lab 02/13/20 0137 02/13/20  [id number] 02/14/20 0707 02/15/20 0411  NA 130*  --  130* 129*  K 4.2  --  4.3 4.3  CL 91*  --  92* 91*  CO2 27  --  27 27  GLUCOSE 173*  --  52* 175*  BUN 24*  --  45* 48*  CREATININE 1.19*  --  1.51* 1.52*  CALCIUM 9.9  --  9.5 9.3  MG 2.8* 2.1  --  1.9   GFR: Estimated Creatinine Clearance: 24 mL/min (A) (by C-G formula based on SCr of 1.52 mg/dL (H)). Liver Function Tests: Recent Labs  Lab 02/13/20 0137  AST 48*  ALT 21  ALKPHOS 91  BILITOT 0.5  PROT 7.8  ALBUMIN 3.4*   No results for input(s): LIPASE, AMYLASE in the last 168 hours. No results for input(s): AMMONIA in the last 168 hours. Coagulation Profile: Recent Labs  Lab 02/13/20 0137  INR 1.0   Cardiac Enzymes: No results for input(s): CKTOTAL, CKMB, CKMBINDEX, TROPONINI in the last 168 hours. BNP (last 3 results) No results for input(s): PROBNP in the last 8760 hours. HbA1C: No results for input(s): HGBA1C in the last 72 hours. CBG: Recent Labs  Lab 02/14/20 1620 02/14/20 2145 02/15/20 0756 02/15/20 1150 02/15/20 1652  GLUCAP 194* 165* 148* 254* 139*   Lipid Profile: Recent Labs    02/14/20 0707  CHOL 114  HDL 64  LDLCALC 39  TRIG 57  CHOLHDL 1.8   Thyroid Function Tests: Recent Labs    02/13/20 0656  TSH 0.763   Anemia Panel: Recent Labs    02/14/20 1116 02/15/20 0411  FOLATE  --  16.1  FERRITIN 13  --   TIBC 454*  --   IRON 19*  --    Sepsis Labs: Recent Labs  Lab 02/13/20 0137  PROCALCITON <0.10     Recent Results (from the past 240 hour(s))  SARS CORONAVIRUS 2 (TAT 6-24 HRS) Nasopharyngeal Nasopharyngeal Swab     Status: None   Collection Time: 02/09/20  3:43 PM   Specimen: Nasopharyngeal Swab  Result Value Ref Range Status   SARS Coronavirus 2 NEGATIVE NEGATIVE Final    Comment: (NOTE) SARS-CoV-2 target nucleic acids are NOT DETECTED.  The SARS-CoV-2 RNA is generally detectable in upper and lower respiratory specimens during the acute phase of infection. Negative results do not preclude SARS-CoV-2 infection, do not rule out co-infections with other pathogens, and should not be used as the sole basis for treatment or other patient management decisions. Negative results must be combined with clinical observations, patient history, and epidemiological information. The expected result is Negative.  Fact Sheet for Patients: HairSlick.nohttps://www.fda.gov/media/138098/download  Fact Sheet for Healthcare Providers: quierodirigir.comhttps://www.fda.gov/media/138095/download  This test is not yet approved or cleared by the [place] FDA and  has been authorized for detection and/or diagnosis of SARS-CoV-2 by FDA under an Emergency Use Authorization (EUA). This EUA will remain  in effect (meaning this test can be used) for the duration of the COVID-19 declaration under Se ction 564(b)(1) of the Act, 21 U.S.C. section 360bbb-3(b)(1), unless the authorization is terminated or revoked sooner.  Performed at [facility], [address] [place], [place] [address]   SARS Coronavirus 2 by RT PCR (hospital order, performed in [facility] hospital lab) Nasopharyngeal Nasopharyngeal Swab     Status: None   Collection Time: 02/13/20  1:37 AM   Specimen: Nasopharyngeal Swab  Result Value Ref Range Status   SARS Coronavirus 2 NEGATIVE NEGATIVE Final  Comment: (NOTE) SARS-CoV-2 target nucleic acids are NOT DETECTED.  The SARS-CoV-2 RNA is generally detectable in upper and lower respiratory  specimens during the acute phase of infection. The lowest concentration of SARS-CoV-2 viral copies this assay can detect is 250 copies / mL. A negative result does not preclude SARS-CoV-2 infection and should not be used as the sole basis for treatment or other patient management decisions.  A negative result may occur with improper specimen collection / handling, submission of specimen other than nasopharyngeal swab, presence of viral mutation(s) within the areas targeted by this assay, and inadequate number of viral copies (<250 copies / mL). A negative result must be combined with clinical observations, patient history, and epidemiological information.  Fact Sheet for Patients:   BoilerBrush.com.cy  Fact Sheet for Healthcare Providers: https://pope.com/  This test is not yet approved or  cleared by the [place] FDA and has been authorized for detection and/or diagnosis of SARS-CoV-2 by FDA under an Emergency Use Authorization (EUA).  This EUA will remain in effect (meaning this test can be used) for the duration of the COVID-19 declaration under Section 564(b)(1) of the Act, 21 U.S.C. section 360bbb-3(b)(1), unless the authorization is terminated or revoked sooner.  Performed at [facility], [address], [place], [place] [address]          Radiology Studies: No results found.      Scheduled Meds: . albuterol      . aspirin EC  81 mg Oral Daily  . buPROPion  150 mg Oral Daily  . calcium-vitamin D  1 tablet Oral Q lunch  . clopidogrel  75 mg Oral Daily  . furosemide  40 mg Intravenous Q12H  . insulin aspart  0-9 Units Subcutaneous TID WC  . insulin glargine  25 Units Subcutaneous QHS  . metoprolol tartrate  12.5 mg Oral BID  . multivitamin with minerals  1 tablet Oral Daily  . omega-3 acid ethyl esters  1 g Oral Daily  . pravastatin  40 mg Oral q1800  . sodium chloride flush  3 mL Intravenous Q12H    Continuous Infusions: . sodium chloride    . heparin 900 Units/hr (02/14/20 0405)     Time spent: 37 minutes with over 50% of the time coordinating the patient's care    Jae Dire, DO [facility] Pager [phone]  Call night coverage person covering after 7pm

## 2020-02-16 ENCOUNTER — Inpatient Hospital Stay: Payer: Medicare Other

## 2020-02-16 DIAGNOSIS — J9601 Acute respiratory failure with hypoxia: Secondary | ICD-10-CM

## 2020-02-16 DIAGNOSIS — E1121 Type 2 diabetes mellitus with diabetic nephropathy: Secondary | ICD-10-CM

## 2020-02-16 DIAGNOSIS — K921 Melena: Secondary | ICD-10-CM

## 2020-02-16 DIAGNOSIS — J441 Chronic obstructive pulmonary disease with (acute) exacerbation: Secondary | ICD-10-CM

## 2020-02-16 DIAGNOSIS — Z794 Long term (current) use of insulin: Secondary | ICD-10-CM

## 2020-02-16 DIAGNOSIS — E1159 Type 2 diabetes mellitus with other circulatory complications: Secondary | ICD-10-CM

## 2020-02-16 DIAGNOSIS — N1831 Chronic kidney disease, stage 3a: Secondary | ICD-10-CM

## 2020-02-16 LAB — CBC WITH DIFFERENTIAL/PLATELET
Abs Immature Granulocytes: 0.16 10*3/uL — ABNORMAL HIGH (ref 0.00–0.07)
Basophils Absolute: 0 10*3/uL (ref 0.0–0.1)
Basophils Relative: 0 %
Eosinophils Absolute: 0.3 10*3/uL (ref 0.0–0.5)
Eosinophils Relative: 2 %
HCT: 26.2 % — ABNORMAL LOW (ref 36.0–46.0)
Hemoglobin: 8.5 g/dL — ABNORMAL LOW (ref 12.0–15.0)
Immature Granulocytes: 1 %
Lymphocytes Relative: 18 %
Lymphs Abs: 3 10*3/uL (ref 0.7–4.0)
MCH: 24.9 pg — ABNORMAL LOW (ref 26.0–34.0)
MCHC: 32.4 g/dL (ref 30.0–36.0)
MCV: 76.8 fL — ABNORMAL LOW (ref 80.0–100.0)
Monocytes Absolute: 1.7 10*3/uL — ABNORMAL HIGH (ref 0.1–1.0)
Monocytes Relative: 10 %
Neutro Abs: 11.1 10*3/uL — ABNORMAL HIGH (ref 1.7–7.7)
Neutrophils Relative %: 69 %
Platelets: 393 10*3/uL (ref 150–400)
RBC: 3.41 MIL/uL — ABNORMAL LOW (ref 3.87–5.11)
RDW: 16 % — ABNORMAL HIGH (ref 11.5–15.5)
WBC: 16.2 10*3/uL — ABNORMAL HIGH (ref 4.0–10.5)
nRBC: 0 % (ref 0.0–0.2)

## 2020-02-16 LAB — MRSA PCR SCREENING: MRSA by PCR: NEGATIVE

## 2020-02-16 LAB — OSMOLALITY, URINE: Osmolality, Ur: 287 mOsm/kg — ABNORMAL LOW (ref 300–900)

## 2020-02-16 LAB — HEPARIN LEVEL (UNFRACTIONATED): Heparin Unfractionated: 0.39 IU/mL (ref 0.30–0.70)

## 2020-02-16 LAB — MAGNESIUM: Magnesium: 1.9 mg/dL (ref 1.7–2.4)

## 2020-02-16 LAB — BASIC METABOLIC PANEL
Anion gap: 11 (ref 5–15)
BUN: 47 mg/dL — ABNORMAL HIGH (ref 8–23)
CO2: 28 mmol/L (ref 22–32)
Calcium: 8.9 mg/dL (ref 8.9–10.3)
Chloride: 87 mmol/L — ABNORMAL LOW (ref 98–111)
Creatinine, Ser: 1.3 mg/dL — ABNORMAL HIGH (ref 0.44–1.00)
GFR calc Af Amer: 42 mL/min — ABNORMAL LOW (ref >60)
GFR calc non Af Amer: 37 mL/min — ABNORMAL LOW (ref >60)
Glucose, Bld: 160 mg/dL — ABNORMAL HIGH (ref 70–99)
Potassium: 4 mmol/L (ref 3.5–5.1)
Sodium: 126 mmol/L — ABNORMAL LOW (ref 135–145)

## 2020-02-16 LAB — HEMOGLOBIN A1C
Hgb A1c MFr Bld: 6.5 % — ABNORMAL HIGH (ref 4.8–5.6)
Mean Plasma Glucose: 139.85 mg/dL

## 2020-02-16 LAB — GLUCOSE, CAPILLARY
Glucose-Capillary: 188 mg/dL — ABNORMAL HIGH (ref 70–99)
Glucose-Capillary: 234 mg/dL — ABNORMAL HIGH (ref 70–99)
Glucose-Capillary: 406 mg/dL — ABNORMAL HIGH (ref 70–99)
Glucose-Capillary: 324 mg/dL — ABNORMAL HIGH (ref 70–99)

## 2020-02-16 LAB — SODIUM, URINE, RANDOM: Sodium, Ur: 57 mmol/L

## 2020-02-16 MED ORDER — SODIUM CHLORIDE 0.9 % IV SOLN
300.0000 mg | Freq: Once | INTRAVENOUS | Status: AC
  Administered 2020-02-16: 300 mg via INTRAVENOUS
  Filled 2020-02-16: qty 15

## 2020-02-16 MED ORDER — IPRATROPIUM-ALBUTEROL 0.5-2.5 (3) MG/3ML IN SOLN
3.0000 mL | Freq: Four times a day (QID) | RESPIRATORY_TRACT | Status: DC
  Administered 2020-02-16 – 2020-02-19 (×14): 3 mL via RESPIRATORY_TRACT
  Filled 2020-02-16 (×14): qty 3

## 2020-02-16 MED ORDER — PREDNISONE 20 MG PO TABS
40.0000 mg | ORAL_TABLET | Freq: Every day | ORAL | Status: AC
  Administered 2020-02-16 – 2020-02-20 (×5): 40 mg via ORAL
  Filled 2020-02-16 (×5): qty 2

## 2020-02-16 MED ORDER — PANTOPRAZOLE SODIUM 40 MG PO TBEC
40.0000 mg | DELAYED_RELEASE_TABLET | Freq: Every day | ORAL | Status: DC
  Administered 2020-02-16 – 2020-02-17 (×2): 40 mg via ORAL
  Filled 2020-02-16 (×2): qty 1

## 2020-02-16 MED ORDER — PREDNISONE 20 MG PO TABS: 40.0000 mg | ORAL_TABLET | Freq: Every day | ORAL | Status: DC

## 2020-02-16 MED ORDER — SODIUM CHLORIDE 0.9 % IV SOLN
2.0000 g | Freq: Every day | INTRAVENOUS | Status: AC
  Administered 2020-02-16 – 2020-02-20 (×5): 2 g via INTRAVENOUS
  Filled 2020-02-16 (×6): qty 2

## 2020-02-16 MED ORDER — FUROSEMIDE 10 MG/ML IJ SOLN
40.0000 mg | Freq: Every day | INTRAMUSCULAR | Status: DC
  Administered 2020-02-17: 40 mg via INTRAVENOUS
  Filled 2020-02-16 (×2): qty 4

## 2020-02-16 MED ORDER — ALBUTEROL SULFATE (2.5 MG/3ML) 0.083% IN NEBU
2.5000 mg | INHALATION_SOLUTION | RESPIRATORY_TRACT | Status: DC | PRN
  Administered 2020-02-17 – 2020-02-22 (×5): 2.5 mg via RESPIRATORY_TRACT
  Filled 2020-02-16 (×5): qty 3

## 2020-02-16 MED ORDER — INSULIN GLARGINE 100 UNIT/ML ~~LOC~~ SOLN
28.0000 [IU] | Freq: Every day | SUBCUTANEOUS | Status: DC
  Administered 2020-02-16 – 2020-02-17 (×2): 28 [IU] via SUBCUTANEOUS
  Filled 2020-02-16 (×3): qty 0.28

## 2020-02-16 MED ORDER — ENSURE ENLIVE PO LIQD
237.0000 mL | Freq: Two times a day (BID) | ORAL | Status: DC
  Administered 2020-02-16 – 2020-02-22 (×6): 237 mL via ORAL

## 2020-02-16 MED ORDER — INSULIN ASPART 100 UNIT/ML ~~LOC~~ SOLN
0.0000 [IU] | Freq: Three times a day (TID) | SUBCUTANEOUS | Status: DC
  Administered 2020-02-16: 15 [IU] via SUBCUTANEOUS
  Administered 2020-02-17: 5 [IU] via SUBCUTANEOUS
  Administered 2020-02-17: 3 [IU] via SUBCUTANEOUS
  Administered 2020-02-17: 8 [IU] via SUBCUTANEOUS
  Administered 2020-02-18: 5 [IU] via SUBCUTANEOUS
  Administered 2020-02-18: 2 [IU] via SUBCUTANEOUS
  Filled 2020-02-16 (×7): qty 1

## 2020-02-16 NOTE — Progress Notes (Signed)
ANTICOAGULATION CONSULT NOTE  Pharmacy Consult for Heparin Indication: chest pain/ACS  Allergies  Allergen Reactions  . Atorvastatin Other (See Comments)    "Felt as if she would pass out"  . Simvastatin Other (See Comments)    "Felt as if she would pass out"     Patient Measurements: Height: 5\' 2"  (157.5 cm) Weight: 70.2 kg (154 lb 11.2 oz) IBW/kg (Calculated) : 50.1 HEPARIN DW (KG): 65.4  Vital Signs: Temp: 98.4 F (36.9 C) (08/20 0342) Temp Source: Oral (08/20 0342) BP: 139/62 (08/20 0342) Pulse Rate: 68 (08/20 0342)  Labs: Recent Labs    02/14/20 0518 02/14/20 0707 02/14/20 0707 02/14/20 1116 02/14/20 1810 02/14/20 2033 02/14/20 2033 02/15/20 0411 02/16/20 0517  HGB  --  7.4*   < >  --   --  7.9*   < > 8.5* 8.5*  HCT  --  23.5*   < >  --   --  25.5*  --  28.1* 26.2*  PLT  --  420*  --   --   --   --   --  409* 393  HEPARINUNFRC   < >  --   --  0.33  --   --   --  0.39 0.39  CREATININE  --  1.51*  --   --   --   --   --  1.52* 1.30*  TROPONINIHS  --   --   --   --  1,392* 1,517*  --   --   --    < > = values in this interval not displayed.    Estimated Creatinine Clearance: 27.4 mL/min (A) (by C-G formula based on SCr of 1.3 mg/dL (H)).  Medical History: Past Medical History:  Diagnosis Date  . Diabetes mellitus without complication (HCC)   . Hypertension   . Peripheral artery disease (HCC)     Medications:  Medications Prior to Admission  Medication Sig Dispense Refill Last Dose  . acetaminophen (TYLENOL) 500 MG tablet Take 1,000 mg by mouth every 6 (six) hours as needed for moderate pain.    02/12/2020 at 2000  . aspirin EC 81 MG tablet Take 81 mg by mouth daily.    02/12/2020 at 1000  . benazepril-hydrochlorthiazide (LOTENSIN HCT) 20-12.5 MG tablet Take 1 tablet by mouth daily.   02/12/2020 at 1000  . buPROPion (WELLBUTRIN XL) 150 MG 24 hr tablet Take 150 mg by mouth daily.   02/12/2020 at 1000  . Calcium Citrate-Vitamin D (CALCIUM CITRATE + D3 PO)  Take 2 tablets by mouth daily with lunch.   02/12/2020 at 1800  . Cinnamon 500 MG TABS Take 500 mg by mouth daily.   02/12/2020 at 1000  . clopidogrel (PLAVIX) 75 MG tablet Take 1 tablet by mouth once daily with breakfast (Patient taking differently: Take 75 mg by mouth daily. ) 90 tablet 0 02/12/2020 at 1000  . Multiple Vitamin (MULTIVITAMIN WITH MINERALS) TABS tablet Take 1 tablet by mouth daily. Complete Multivitamin   02/12/2020 at 1000  . NOVOLOG MIX 70/30 FLEXPEN (70-30) 100 UNIT/ML FlexPen Inject 35-55 Units into the skin See admin instructions. Inject 55 units subcutaneously in the morning and 35 in the evening  1 02/12/2020 at 1800  . Omega-3 Fatty Acids (FISH OIL) 1000 MG CAPS Take 1,000 mg by mouth 2 (two) times daily.   02/12/2020 at 2000  . pravastatin (PRAVACHOL) 40 MG tablet Take 40 mg by mouth daily.   02/12/2020 at 2000  . oxyCODONE (OXY  IR/ROXICODONE) 5 MG immediate release tablet Take 1 tablet (5 mg total) by mouth every 6 (six) hours as needed for moderate pain or severe pain. (Patient not taking: Reported on 01/05/2019) 20 tablet 0     Assessment: Heparin initiated for ACS/STEMI.  No anticoagulants PTA per med list. Baseline CBC with Hgb of 8 which appears c/w patient's baseline. Baseline aPTT, PT-INR WNL.   8/18 0518 HL 0.34  8/18 1116 HL 0.32 8/19 0411 HL 0.39 8/20 0517 HL 0.39  Goal of Therapy:  Heparin level 0.3-0.7 units/ml Monitor platelets by anticoagulation protocol: Yes   Plan:  Heparin level is therapeutic x 4. CBC stable.  Will continue heparin infusion at 900 units/hr. Recheck heparin level and CBC with AM labs.    Wayland Denis, PharmD 02/16/2020,6:45 AM

## 2020-02-16 NOTE — Consult Note (Signed)
Pharmacy Antibiotic Note  Hannah Conway is a 86 y.o. female admitted on 02/13/2020 with pneumonia.  Pharmacy has been consulted for cefepime dosing.  Plan: Day 1 of abx. Cefepime 2 g q24H.   Height: 5\' 2"  (157.5 cm) Weight: 70.2 kg (154 lb 11.2 oz) IBW/kg (Calculated) : 50.1  Temp (24hrs), Avg:98.3 F (36.8 C), Min:97.7 F (36.5 C), Max:98.6 F (37 C)  Recent Labs  Lab 02/13/20 0137 02/14/20 0518 02/14/20 0707 02/15/20 0411 02/16/20 0517  WBC 14.5* 19.1* 20.1* 15.7* 16.2*  CREATININE 1.19*  --  1.51* 1.52* 1.30*    Estimated Creatinine Clearance: 27.4 mL/min (A) (by C-G formula based on SCr of 1.3 mg/dL (H)).    Allergies  Allergen Reactions  . Atorvastatin Other (See Comments)    "Felt as if she would pass out"  . Simvastatin Other (See Comments)    "Felt as if she would pass out"     Antimicrobials this admission: 8/20 cefepime >>   Dose adjustments this admission: None  Microbiology results: None  Thank you for allowing pharmacy to be a part of this patient's care.  9/20, PharmD, BCPS 02/16/2020 2:30 PM

## 2020-02-16 NOTE — Progress Notes (Signed)
Patient resting in bed with daughter at bedside. Patient appears to be breathing easier without continuous coughing. Administered ordered Neb treatments q2h. Chest xray positive for probable pneumonia and CHF. Antibiotics will be hung for patient as soon as it arrives from pharmacy.

## 2020-02-16 NOTE — Progress Notes (Signed)
Initial Nutrition Assessment  DOCUMENTATION CODES:   Not applicable  INTERVENTION:  Ensure Enlive po BID, each supplement provides 350 kcal and 20 grams of protein (chocolate) MVI with minerals daily Diet Education   NUTRITION DIAGNOSIS:   Inadequate oral intake related to poor appetite as evidenced by per patient/family report.  GOAL:   Patient will meet greater than or equal to 90% of their needs    MONITOR:   PO intake, Supplement acceptance, Weight trends, Labs, I & O's  REASON FOR ASSESSMENT:   Malnutrition Screening Tool    ASSESSMENT:  86 year old female with past medical history of DM2, HTN, PVD presented with worsening dyspnea and respiratory distress x 4 days admitted for NSTEMI.  Patient sitting up in bed, daughter at bedside this morning. Patient reports not feeling well today, endorsing new onset poor appetite, eating bites of french toast for breakfast per daughter. She has been eating well, per flowsheets 94% average x 5 documented intakes. Patient reports she is normally not a "big eater" at home, recalls a biscuit for breakfast, piddles with snacks (chips, crackers) throughout the day, and eats a light supper. RD educated on dietary recall high in sodium, provided "Heart Healthy Consistent Carbohydrate"as well as "Sodium Free Flavoring Ideas" handouts from the Academy of Nutrition and Dietetics. Observed opened Ensure on bedside tray, pt reports trying to drink and complains of it tasting chalky. RD suggested trying supplement over ice, pt appreciative and amenable to drinking BID during admission to aid with meeting needs.   Per chart, stable weight history over the last 2 years.  I/Os: -571 ml since admit      -1070 ml x 24 hrs UOP: 1550 ml x 24 hrs Medications reviewed and include: Oscal with D, Lasix IV 40 mg daily, SSI, Lantus 25 units daily, MVI, Protonix, Prednisone, Lovaza  Labs: CBGs 188,232, Na 126 (L), BUN 47 (H), Cr 1.30 (H), WBC 16.2 (H) 8/17  BNP 1492.8 (H) Lab Results  Component Value Date   HGBA1C 7.1 (H) 08/31/2013    NUTRITION - FOCUSED PHYSICAL EXAM: Findings: Mild orbital, buccal fat depletion   Diet Order:   Diet Order            Diet Heart Room service appropriate? Yes; Fluid consistency: Thin  Diet effective now                 EDUCATION NEEDS:   Education needs have been addressed  Skin:  Skin Assessment: Reviewed RN Assessment  Last BM:  8/20- type 6  Height:   Ht Readings from Last 1 Encounters:  02/13/20 5\' 2"  (1.575 m)    Weight:   Wt Readings from Last 1 Encounters:  02/16/20 70.2 kg    Ideal Body Weight:  50 kg  BMI:  Body mass index is 28.3 kg/m.  Estimated Nutritional Needs:   Kcal:  1600-1800  Protein:  77-87  Fluid:  >/= 1.7 L/day   02/18/20, RD, LDN Clinical Nutrition After Hours/Weekend Pager # in Amion

## 2020-02-16 NOTE — Progress Notes (Signed)
PROGRESS NOTE    Hannah Conway    Code Status: Full Code  NAT:[id number] DOB: 1929/09/04 DOA: 02/13/2020 LOS: 3 days  PCP: Mickey Farber, MD CC:  Chief Complaint  Patient presents with  . Respiratory Distress       Hospital Summary   This is an 86 year old female with a past medical history of diabetes, hypertension, PVD who presented to the ED on 8/17 with acutely worsening dyspnea and respiratory distress x4 days which had worsened overnight prior to admission.  Also had dry cough, nausea without vomiting.  Denied any chest pain or palpitations  Upon presentation to the emergency room, blood pressure was 149/53 and pulse 70 was 90% on CPAP with otherwise normal vital signs.  Labs revealed VBG with pH 7.42 and bicarbonate of 25.9.  CMP was unremarkable.  BNP was 1085.6.  High-sensitivity troponin I was 2180 and later 1848 with procalcitonin less than 0.1.  CBC showed leukocytosis of 14.5 with anemia with hemoglobin of 8 hematocrit 26.1.  Chest x-ray showed diffuse interstitial opacity worse in the lower lobes likely indicating pulmonary edema., EKG showed sinus rhythm with rate of 85 with PACs, incomplete right bundle branch block and prolonged QT interval with QTC of 503 MS.  The patient was given aspirin as well as 20 mg of IV Lasix, IV heparin bolus and drip.    She was admitted to a progressive bed and cardiology was consulted.  An echo was obtained and showed EF 45 to 50% moderate LVH, grade 2 diastolic dysfunction and severe hypokinesis of left ventricle and entire anterolateral wall and inferior lateral wall as well as moderate MR and severe AS and moderate aortic regurgitation.  8/18: Hb 6.7 -> 7.4 on repeat.  Given 1 unit PRBC transfusion in setting of NSTEMI  A & P   Principal Problem:   Acute hypoxemic respiratory failure (HCC) Active Problems:   Hypertension associated with type 2 diabetes mellitus (HCC)   Type 2 diabetes mellitus with stage 3 chronic kidney disease, with  long-term current use of insulin (HCC)   NSTEMI (non-ST elevated myocardial infarction) (HCC)   COPD with acute exacerbation (HCC)   1. NSTEMI, stable a. Echo with wall motion and valvular abnormalities as above b. Continue heparin drip c. Added on beta blocker d. On aspirin and Plavix - appreciate cardio recommendations with the heparin e. Continue telemetry f. Cardio: Continue aggressive medical management and consider cardiac cath prior to discharge  2. Acute on chronic iron deficiency anemia while on heparin a. Had an episode of dark stool yesterday, cleared up today b. Anemia improved after 1 unit PRBCs c. 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 GI consulted: Likely chronic.  Given her NSTEMI and heart failure she is at high risk for anesthesia and endoscopic evaluation especially since she is not acutely bleeding.  She will need endoscopy in a few weeks post cardiac event.  Recommended IV iron and long-term use of PPI (both added today)  3. Acute hypoxic respiratory failure secondary to diastolic heart failure exacerbation and COPD exacerbation  concern for HAP a. Continues to require 4 to 5 L/min O2, on room air at baseline b. Continues to have coughing with wheezing has been a long term smoker and only recently quit, likely underlying COPD c. CXR concerning for both pulmonary edema now developing consolidation d. will start on bronchodilators as well as steroids e. Start cefepime f. Hold antitussives as the patient had  altered mental status with these meds and has a prolonged QTc so Tessalon pearls are contraindicated  4. Diastolic heart failure exacerbation a. Echo: EF 45 to 50% moderate LVH, grade 2 diastolic dysfunction and severe hypokinesis of left ventricle and entire anterolateral wall and inferior lateral wall as well as moderate MR and severe AS and moderate aortic regurgitation b. Weight improved but still  above baseline, output: -571 cc c. IV Lasix decreased to daily d. Discontinued ACE due to AKI e. Cardio on board f. Continue heart failure protocol   5. AKI on CKD 3a, likely multifactorial: Cardiorenal, anemia, hemodynamic changes, ACE inhibitor/HCTZ, Improving a. Holding benzapril b. Lasix decreased c. Nephrology consulted, discussed with Dr. Thedore Mins yesterday  6. Hyponatremia, unclear etiology a. Over diuresis with hyponatremia and hypochloremia? b. Nephrology consulted, appreciate recommendations  7. Leukocytosis a. Afebrile and no obvious source of infection b. Could be reactive to NSTEMI c. Hold off antibiotics for now  8. Hypertension a. Stable on metoprolol b. Hold Benzapril and HCTZ for today  9. Hyperlipidemia, Stable  10. Type 2 diabetes a. Starting steroids b. Will increase lantus and advance sliding scale  11. Depression a. Continue Wellbutrin XL   DVT prophylaxis: Heparin   Family Communication: Patient's family at bedside has been updated   Disposition Plan:  Status is: Inpatient  Remains inpatient appropriate because:Unsafe d/c plan, IV treatments appropriate due to intensity of illness or inability to take PO and Inpatient level of care appropriate due to severity of illness   Dispo: The patient is from: Home              Anticipated d/c is to: TBD              Anticipated d/c date is: 3 days              Patient currently is not medically stable to d/c.          Pressure injury documentation    None  Consultants  Cardiology   Procedures  None  Antibiotics   Anti-infectives (From admission, onward)   Start     Dose/Rate Route Frequency Ordered Stop   02/16/20 1500  ceFEPIme (MAXIPIME) 2 g in sodium chloride 0.9 % 100 mL IVPB        2 g 200 mL/hr over 30 Minutes Intravenous Daily 02/16/20 1428          Subjective   That she is feeling better today compared to yesterday but still having some shortness of breath.  Had a BM  today for the first time described as dark but formed.  No other complaints no overnight events.  Objective   Vitals:   02/16/20 0342 02/16/20 0750 02/16/20 0928 02/16/20 1157  BP: 139/62 (!) 152/68  (!) 134/58  Pulse: 68 67  73  Resp: 16 18  18   Temp: 98.4 F (36.9 C) 98.4 F (36.9 C)  98.6 F (37 C)  TempSrc: Oral Oral  Oral  SpO2: 97% 96% 98% 97%  Weight: 70.2 kg     Height:        Intake/Output Summary (Last 24 hours) at 02/16/2020 1456 Last data filed at 02/15/2020 1800 Gross per 24 hour  Intake --  Output 800 ml  Net -800 ml   Filed Weights   02/13/20 1719 02/14/20 0500 02/16/20 0342  Weight: 71.8 kg 73.6 kg 70.2 kg    Examination:  Physical Exam Vitals and nursing note reviewed.  Constitutional:  Appearance: Normal appearance.  HENT:     Head: Normocephalic and atraumatic.  Eyes:     Conjunctiva/sclera: Conjunctivae normal.  Cardiovascular:     Rate and Rhythm: Normal rate.     Heart sounds: Murmur heard.   Pulmonary:     Effort: Pulmonary effort is normal.     Breath sounds: Wheezing present.  Abdominal:     General: Abdomen is flat.     Palpations: Abdomen is soft.  Musculoskeletal:        General: No swelling.  Skin:    Coloration: Skin is not jaundiced or pale.  Neurological:     Mental Status: She is alert. Mental status is at baseline.  Psychiatric:        Mood and Affect: Mood normal.        Behavior: Behavior normal.     Data Reviewed: I have personally reviewed following labs and imaging studies  CBC: Recent Labs  Lab 02/13/20 0137 02/13/20 0137 02/14/20 0518 02/14/20 0707 02/14/20 2033 02/15/20 0411 02/16/20 0517  WBC 14.5*  --  19.1* 20.1*  --  15.7* 16.2*  NEUTROABS 8.9*  --  11.4*  --   --  9.4* 11.1*  HGB 8.0*   < > 6.7* 7.4* 7.9* 8.5* 8.5*  HCT 26.1*   < > 22.3* 23.5* 25.5* 28.1* 26.2*  MCV 79.6*  --  80.2 77.3*  --  81.2 76.8*  PLT 469*  --  417* 420*  --  409* 393   < > = values in this interval not displayed.    Basic Metabolic Panel: Recent Labs  Lab 02/13/20 0137 02/13/20 0656 02/14/20 0707 02/15/20 0411 02/16/20 0517  NA 130*  --  130* 129* 126*  K 4.2  --  4.3 4.3 4.0  CL 91*  --  92* 91* 87*  CO2 27  --  GLUCOSE 173*  --  52* 175* 160*  BUN 24*  --  45* 48* 47*  CREATININE 1.19*  --  1.51* 1.52* 1.30*  CALCIUM 9.9  --  9.5 9.3 8.9  MG 2.8* 2.1  --  1.9 1.9   GFR: Estimated Creatinine Clearance: 27.4 mL/min (A) (by C-G formula based on SCr of 1.3 mg/dL (H)). Liver Function Tests: Recent Labs  Lab 02/13/20 0137  AST 48*  ALT 21  ALKPHOS 91  BILITOT 0.5  PROT 7.8  ALBUMIN 3.4*   No results for input(s): LIPASE, AMYLASE in the last 168 hours. No results for input(s): AMMONIA in the last 168 hours. Coagulation Profile: Recent Labs  Lab 02/13/20 0137  INR 1.0   Cardiac Enzymes: No results for input(s): CKTOTAL, CKMB, CKMBINDEX, TROPONINI in the last 168 hours. BNP (last 3 results) No results for input(s): PROBNP in the last 8760 hours. HbA1C: No results for input(s): HGBA1C in the last 72 hours. CBG: Recent Labs  Lab 02/15/20 1150 02/15/20 1652 02/15/20 2130 02/16/20 0751 02/16/20 1157  GLUCAP 254* 139* 232* 188* 234*   Lipid Profile: Recent Labs    02/14/20 0707  CHOL 114  HDL 64  LDLCALC 39  TRIG 57  CHOLHDL 1.8   Thyroid Function Tests: No results for input(s): TSH, T4TOTAL, FREET4, T3FREE, THYROIDAB in the last 72 hours. Anemia Panel: Recent Labs    02/14/20 1116 02/15/20 0411  VITAMINB12  --  460  FOLATE  --  16.1  FERRITIN 13  --   TIBC 454*  --   IRON 19*  --  Sepsis Labs: Recent Labs  Lab 02/13/20 0137  PROCALCITON <0.10    Recent Results (from the past 240 hour(s))  SARS CORONAVIRUS 2 (TAT 6-24 HRS) Nasopharyngeal Nasopharyngeal Swab     Status: None   Collection Time: 02/09/20  3:43 PM   Specimen: Nasopharyngeal Swab  Result Value Ref Range Status   SARS Coronavirus 2 NEGATIVE NEGATIVE Final    Comment:  (NOTE) SARS-CoV-2 target nucleic acids are NOT DETECTED.  The SARS-CoV-2 RNA is generally detectable in upper and lower respiratory specimens during the acute phase of infection. Negative results do not preclude SARS-CoV-2 infection, do not rule out co-infections with other pathogens, and should not be used as the sole basis for treatment or other patient management decisions. Negative results must be combined with clinical observations, patient history, and epidemiological information. The expected result is Negative.  Fact Sheet for Patients: HairSlick.nohttps://www.fda.gov/media/138098/download  Fact Sheet for Healthcare Providers: quierodirigir.comhttps://www.fda.gov/media/138095/download  This test is not yet approved or cleared by the [place] FDA and  has been authorized for detection and/or diagnosis of SARS-CoV-2 by FDA under an Emergency Use Authorization (EUA). This EUA will remain  in effect (meaning this test can be used) for the duration of the COVID-19 declaration under Se ction 564(b)(1) of the Act, 21 U.S.C. section 360bbb-3(b)(1), unless the authorization is terminated or revoked sooner.  Performed at [facility], [address] [place], [place] [address]   SARS Coronavirus 2 by RT PCR (hospital order, performed in [facility] hospital lab) Nasopharyngeal Nasopharyngeal Swab     Status: None   Collection Time: 02/13/20  1:37 AM   Specimen: Nasopharyngeal Swab  Result Value Ref Range Status   SARS Coronavirus 2 NEGATIVE NEGATIVE Final    Comment: (NOTE) SARS-CoV-2 target nucleic acids are NOT DETECTED.  The SARS-CoV-2 RNA is generally detectable in upper and lower respiratory specimens during the acute phase of infection. The lowest concentration of SARS-CoV-2 viral copies this assay can detect is 250 copies / mL. A negative result does not preclude SARS-CoV-2 infection and should not be used as the sole basis for treatment or other patient management decisions.  A negative  result may occur with improper specimen collection / handling, submission of specimen other than nasopharyngeal swab, presence of viral mutation(s) within the areas targeted by this assay, and inadequate number of viral copies (<250 copies / mL). A negative result must be combined with clinical observations, patient history, and epidemiological information.  Fact Sheet for Patients:   BoilerBrush.com.cyhttps://www.fda.gov/media/136312/download  Fact Sheet for Healthcare Providers: https://pope.com/https://www.fda.gov/media/136313/download  This test is not yet approved or  cleared by the [place] FDA and has been authorized for detection and/or diagnosis of SARS-CoV-2 by FDA under an Emergency Use Authorization (EUA).  This EUA will remain in effect (meaning this test can be used) for the duration of the COVID-19 declaration under Section 564(b)(1) of the Act, 21 U.S.C. section 360bbb-3(b)(1), unless the authorization is terminated or revoked sooner.  Performed at [facility], [address] [place], [place] [address]          Radiology Studies: DG Chest [place] View  Result Date: 02/16/2020 CLINICAL DATA:  Cough and respiratory distress EXAM: PORTABLE CHEST 1 VIEW COMPARISON:  February 13, 2020 FINDINGS: There is consolidation in the right base region with right pleural effusion. There is cardiomegaly with pulmonary venous hypertension. There is diffuse interstitial edema. No adenopathy. There is aortic atherosclerosis. IMPRESSION: Cardiomegaly with pulmonary vascular congestion. Diffuse interstitial edema with right  pleural effusion. Suspect congestive heart failure. Superimposed airspace opacity in the right lower lung region may represent superimposed pneumonia. There may also be alveolar edema in this area. Both edema and pneumonia may present concurrently. Aortic Atherosclerosis (ICD10-I70.0). Electronically Signed   By: Bretta Bang III M.D.   On: 02/16/2020 10:36        Scheduled  Meds: . aspirin EC  81 mg Oral Daily  . buPROPion  150 mg Oral Daily  . calcium-vitamin D  1 tablet Oral Q lunch  . clopidogrel  75 mg Oral Daily  . feeding supplement (ENSURE ENLIVE)  237 mL Oral BID BM  . [START ON 02/17/2020] furosemide  40 mg Intravenous Daily  . insulin aspart  0-15 Units Subcutaneous TID WC  . insulin glargine  28 Units Subcutaneous QHS  . ipratropium-albuterol  3 mL Nebulization Q6H  . metoprolol tartrate  12.5 mg Oral BID  . multivitamin with minerals  1 tablet Oral Daily  . omega-3 acid ethyl esters  1 g Oral Daily  . pantoprazole  40 mg Oral Daily  . pravastatin  40 mg Oral q1800  . predniSONE  40 mg Oral Q breakfast  . sodium chloride flush  3 mL Intravenous Q12H   Continuous Infusions: . sodium chloride    . ceFEPime (MAXIPIME) IV    . heparin 900 Units/hr (02/16/20 1447)     Time spent: 37 minutes with over 50% of the time coordinating the patient's care    Jae Dire, DO [facility] Pager [phone]  Call night coverage person covering after 7pm

## 2020-02-16 NOTE — Progress Notes (Signed)
Patient SHOB while moving her in bed to clean up BM. Turned 02 up to 7L to maintain stats in 90s. Called respiratory to come assist with patient. Gave patient PRN neb treatment while respiratory in room [facility]). Patient states treatment is helping. Will place patient back onto 3L when treatment complete. Patient states she is not feeling so short of breath now. Daughter at bedside. Wheezing assessed in lung fields.

## 2020-02-16 NOTE — Evaluation (Signed)
Physical Therapy Evaluation Patient Details Name: Hannah Conway MRN: [id number] DOB: Feb 10, 1930 Today's Date: 02/16/2020   History of Present Illness  Hannah Conway is an 86 y/o female with complaints of worsening dyspnea x 4 days and was admitted for NSTEMI. PMH includes DM, HTN, and PAD.  Clinical Impression  Pt lying in bed on 3L O2 via nasal cannula with daughter at bedside. Pt agreeable to PT evaluation. PT with generalized weakness grossly 3+ to 4-/5 in all 4 extremities. Prior to mobility, pt at 95% SpO2. Pt required mod A for bed mobility and max A for stand pivot transfer for boost into standing, balance while upright, and eccentric control on descent, from bed. Max A for weightshifting for pt to be able to pivot feet prior to sitting to recliner chair. Pt had bouts of forceful, non-productive coughing throughout session, primarily after movements. Pt presents with decreased strength, mobility, balance, functional activity tolerance, and difficulty breathing. Recommend skilled PT this acute stay to address current deficits and STR at discharge to optimize return to PLOF and maximize functional mobility. If pt and family refuse STR, recommend HHPT. Pt above 95% SpO2 at end of session while remaining on 3L throughout.    Follow Up Recommendations SNF    Equipment Recommendations  Rolling walker with 5" wheels;3in1 (PT)    Recommendations for Other Services       Precautions / Restrictions Precautions Precautions: Fall Precaution Comments: high fall risk Restrictions Weight Bearing Restrictions: No      Mobility  Bed Mobility Overal bed mobility: Needs Assistance Bed Mobility: Supine to Sit     Supine to sit: Mod assist     General bed mobility comments: Mod A for rotating hips and trunk elevation to come into sitting. Pt began to cough, non-productive, however O2 sats remained at 96% on 3L.  Transfers Overall transfer level: Needs assistance Equipment used:  None Transfers: Stand Pivot Transfers   Stand pivot transfers: Max assist       General transfer comment: Max A for stand pivot sit transfer for boost into standing, balance, and eccentric control on descent. Max A for weightshifting to pivot on feet.  Ambulation/Gait             General Gait Details: not safe to perform  Stairs            Wheelchair Mobility    Modified Rankin (Stroke Patients Only)       Balance Overall balance assessment: Needs assistance Sitting-balance support: Bilateral upper extremity supported;Feet supported Sitting balance-[name]: Fair Sitting balance - Comments: CGA for steadying   Standing balance support: Bilateral upper extremity supported;During functional activity Standing balance-[name]: Poor Standing balance comment: Unable to remain upright without max A                             Pertinent Vitals/Pain Pain Assessment: No/denies pain    Home Living Family/patient expects to be discharged to:: Private residence Living Arrangements: Spouse/significant other;Children Available Help at Discharge: Family;Available 24 hours/day Type of Home: House Home Access: Stairs to enter Entrance Stairs-Rails: Right;Left;Can reach both Entrance Stairs-Number of Steps: 3 Home Layout: One level Home Equipment: Toilet riser;Grab bars - tub/shower;Walker - 4 wheels      Prior Function Level of Independence: Needs assistance         Comments: Daughter present for eval reports that pt required heavy assist for bed mobility. Daughter also reported that pt  ambulated with use of rollator however the past 2 months, they have been pushing the patient in her rollator for most distances. Occasional assist needed for transfers. Pt gets family assist for ADLs. Daughter endorses 3 falls in last 6 months resulting in bruising.     Hand Dominance        Extremity/Trunk Assessment   Upper Extremity Assessment Upper  Extremity Assessment: Generalized weakness (grossly 3+ to 4-/5 bilaterally)    Lower Extremity Assessment Lower Extremity Assessment: Generalized weakness (RLE: 3+/5 grossly; LLE: 3+ to 4-/5 grossly)       Communication   Communication: No difficulties  Cognition Arousal/Alertness: Awake/alert Behavior During Therapy: WFL for tasks assessed/performed Overall Cognitive Status: Within Functional Limits for tasks assessed                                 General Comments: Pt A & O x 4.      General Comments      Exercises     Assessment/Plan    PT Assessment Patient needs continued PT services  PT Problem List Decreased strength;Decreased range of motion;Decreased activity tolerance;Decreased balance;Decreased mobility;Cardiopulmonary status limiting activity       PT Treatment Interventions DME instruction;Gait training;Stair training;Functional mobility training;Therapeutic activities;Therapeutic exercise;Balance training;Patient/family education    PT Goals (Current goals can be found in the Care Plan section)  Acute Rehab PT Goals Patient Stated Goal: to go home PT Goal Formulation: With patient/family Time For Goal Achievement: 03/01/20 Potential to Achieve Goals: Good    Frequency Min 2X/week   Barriers to discharge        Co-evaluation               AM-PAC PT "6 Clicks" Mobility  Outcome Measure Help needed turning from your back to your side while in a flat bed without using bedrails?: A Little Help needed moving from lying on your back to sitting on the side of a flat bed without using bedrails?: A Lot Help needed moving to and from a bed to a chair (including a wheelchair)?: A Lot Help needed standing up from a chair using your arms (e.g., wheelchair or bedside chair)?: A Lot Help needed to walk in hospital room?: A Lot Help needed climbing 3-5 steps with a railing? : Total 6 Click Score: 12    End of Session Equipment Utilized  During Treatment: Gait belt Activity Tolerance: Patient limited by fatigue Patient left: in chair;with call bell/phone within reach;with chair alarm set;with family/visitor present Nurse Communication: Mobility status PT Visit Diagnosis: Unsteadiness on feet (R26.81);Muscle weakness (generalized) (M62.81);History of falling (Z91.81)    Time: 1456-1530 PT Time Calculation (min) (ACUTE ONLY): 34 min   Charges:             Frederich Chick, SPT  Frederich Chick 02/16/2020, 4:32 PM

## 2020-02-16 NOTE — Progress Notes (Signed)
Patient Name: Hannah Conway Date of Encounter: 02/16/2020  Hospital Problem List     Active Problems:   NSTEMI (non-ST elevated myocardial infarction) [facility](HCC)    Patient Profile     86 year old female with history of peripheral vascular disease, diabetes who was in preop in preparation for an angiogram of her lower extremities by vascular surgery.  She became acutely short of breath which had started in retrospect 4 days earlier but worsened on presentation to the hospital.  She was sent to the emergency room where she complained of a dry cough.  EKG showed nondiagnostic changes for ischemia.  She had elevated troponins to 2180 which have subsequently diminished.  She was felt to have a non-ST elevation myocardial infarction.  Echo revealed ejection fraction 45 to 50% with hypokinesis of the anterolateral wall.  She had severe aortic stenosis with a mean gradient of 40 mmHg with a V-max of 3.98 m/s.  Her hospital course was complicated by anemia requiring transfusion as well as increasing respiratory distress.  Chest x-ray showed diffuse interstitial opacity in the lower lobes.  Subjective   Still short of breath with coughing.  Receiving blood.  Inpatient Medications    . aspirin EC  81 mg Oral Daily  . buPROPion  150 mg Oral Daily  . calcium-vitamin D  1 tablet Oral Q lunch  . clopidogrel  75 mg Oral Daily  . feeding supplement (ENSURE ENLIVE)  237 mL Oral BID BM  . [START ON 02/17/2020] furosemide  40 mg Intravenous Daily  . insulin aspart  0-9 Units Subcutaneous TID WC  . insulin glargine  25 Units Subcutaneous QHS  . ipratropium-albuterol  3 mL Nebulization Q6H  . metoprolol tartrate  12.5 mg Oral BID  . multivitamin with minerals  1 tablet Oral Daily  . omega-3 acid ethyl esters  1 g Oral Daily  . pantoprazole  40 mg Oral Daily  . pravastatin  40 mg Oral q1800  . predniSONE  40 mg Oral Q breakfast  . sodium chloride flush  3 mL Intravenous Q12H    Vital Signs    Vitals:    02/16/20 0342 02/16/20 0750 02/16/20 0928 02/16/20 1157  BP: 139/62 (!) 152/68  (!) 134/58  Pulse: 68 67  73  Resp: 16 18  18   Temp: 98.4 F (36.9 C) 98.4 F (36.9 C)  98.6 F (37 C)  TempSrc: Oral Oral  Oral  SpO2: 97% 96% 98% 97%  Weight: 70.2 kg     Height:        Intake/Output Summary (Last 24 hours) at 02/16/2020 1451 Last data filed at 02/15/2020 1800 Gross per 24 hour  Intake --  Output 800 ml  Net -800 ml   Filed Weights   02/13/20 1719 02/14/20 0500 02/16/20 0342  Weight: 71.8 kg 73.6 kg 70.2 kg    Physical Exam    GEN: Well nourished, well developed, in no acute distress.  HEENT: normal.  Neck: Supple, no JVD, carotid bruits, or masses. Cardiac: RRR, no murmurs, rubs, or gallops. No clubbing, cyanosis, edema.  Radials/DP/PT 2+ and equal bilaterally.  Respiratory:  Respirations regular and unlabored, clear to auscultation bilaterally. GI: Soft, nontender, nondistended, BS + x 4. MS: no deformity or atrophy. Skin: warm and dry, no rash. Neuro:  Strength and sensation are intact. Psych: Normal affect.  Labs    CBC Recent Labs    02/15/20 0411 02/16/20 0517  WBC 15.7* 16.2*  NEUTROABS 9.4* 11.1*  HGB 8.5*  8.5*  HCT 28.1* 26.2*  MCV 81.2 76.8*  PLT 409* 393   Basic Metabolic Panel Recent Labs    49/44/96 0411 02/16/20 0517  NA 129* 126*  K 4.3 4.0  CL 91* 87*  CO2 27 28  GLUCOSE 175* 160*  BUN 48* 47*  CREATININE 1.52* 1.30*  CALCIUM 9.3 8.9  MG 1.9 1.9   Liver Function Tests No results for input(s): AST, ALT, ALKPHOS, BILITOT, PROT, ALBUMIN in the last 72 hours. No results for input(s): LIPASE, AMYLASE in the last 72 hours. Cardiac Enzymes No results for input(s): CKTOTAL, CKMB, CKMBINDEX, TROPONINI in the last 72 hours. BNP No results for input(s): BNP in the last 72 hours. D-Dimer No results for input(s): DDIMER in the last 72 hours. Hemoglobin A1C No results for input(s): HGBA1C in the last 72 hours. Fasting Lipid Panel Recent  Labs    02/14/20 0707  CHOL 114  HDL 64  LDLCALC 39  TRIG 57  CHOLHDL 1.8   Thyroid Function Tests No results for input(s): TSH, T4TOTAL, T3FREE, THYROIDAB in the last 72 hours.  Invalid input(s): FREET3  Telemetry    Sinus tachycardia  ECG    Sinus rhythm no ischemia  Radiology    DG Chest Port 1 View  Result Date: 02/16/2020 CLINICAL DATA:  Cough and respiratory distress EXAM: PORTABLE CHEST 1 VIEW COMPARISON:  February 13, 2020 FINDINGS: There is consolidation in the right base region with right pleural effusion. There is cardiomegaly with pulmonary venous hypertension. There is diffuse interstitial edema. No adenopathy. There is aortic atherosclerosis. IMPRESSION: Cardiomegaly with pulmonary vascular congestion. Diffuse interstitial edema with right pleural effusion. Suspect congestive heart failure. Superimposed airspace opacity in the right lower lung region may represent superimposed pneumonia. There may also be alveolar edema in this area. Both edema and pneumonia may present concurrently. Aortic Atherosclerosis (ICD10-I70.0). Electronically Signed   By: Bretta Bang III M.D.   On: 02/16/2020 10:36   DG Chest Portable 1 View  Result Date: 02/13/2020 CLINICAL DATA:  Dyspnea EXAM: PORTABLE CHEST 1 VIEW COMPARISON:  04/28/2013 FINDINGS: Diffuse interstitial opacity, worst in the lower lobes. No pneumothorax or sizable pleural effusion. IMPRESSION: Diffuse interstitial opacity, worst in the lower lobes, likely indicating pulmonary edema. Electronically Signed   By: Deatra Robinson M.D.   On: 02/13/2020 02:09   VAS [place] ABI WITH/WO TBI  Result Date: 02/01/2020 LOWER EXTREMITY DOPPLER STUDY Indications: Peripheral artery disease, and 08/09/2018              Bilat CIA PTA stents              Left EIA PTA stent              Right SFA to Pop PTA stent.  Vascular Interventions: 01/13/2019: PTA and Stent to the Right SFA and Popliteal                         Artery. Mechanical  Thrombectomy of Occluded Rt SFA                         stents. Comparison Study: 01/30/2019 Performing Technologist: Debbe Bales RVS  Examination Guidelines: A complete evaluation includes at minimum, Doppler waveform signals and systolic blood pressure reading at the level of bilateral brachial, anterior tibial, and posterior tibial arteries, when vessel segments are accessible. Bilateral testing is considered an integral part of a complete examination. Photoelectric Plethysmograph (PPG) waveforms and  toe systolic pressure readings are included as required and additional duplex testing as needed. Limited examinations for reoccurring indications may be performed as noted.  ABI Findings: +---------+------------------+-----+--------+--------+ Right    Rt Pressure (mmHg)IndexWaveformComment  +---------+------------------+-----+--------+--------+ Brachial 132                                     +---------+------------------+-----+--------+--------+ ATA      143               1.04 biphasic         +---------+------------------+-----+--------+--------+ PTA      130               0.94 biphasic         +---------+------------------+-----+--------+--------+ Great Toe110               0.80 Normal           +---------+------------------+-----+--------+--------+ +---------+------------------+-----+--------+-------+ Left     Lt Pressure (mmHg)IndexWaveformComment +---------+------------------+-----+--------+-------+ Brachial 138                                    +---------+------------------+-----+--------+-------+ ATA      99                0.72 biphasic        +---------+------------------+-----+--------+-------+ PERO     103               0.75 biphasic        +---------+------------------+-----+--------+-------+ Great Toe71                0.51 Abnormal        +---------+------------------+-----+--------+-------+  +-------+-----------+-----------+------------+------------+ ABI/TBIToday's ABIToday's TBIPrevious ABIPrevious TBI +-------+-----------+-----------+------------+------------+ Right  1.04       .80        1.04        .55          +-------+-----------+-----------+------------+------------+ Left   .75        .51        .73         .83          +-------+-----------+-----------+------------+------------+ Right ABIs appear essentially unchanged compared to prior study on 01/30/2019. Left ABIs appear essentially unchanged compared to prior study on 01/30/2019. Rt TBIs appear to be increased compared to prior study on 01/30/2019. Lt TBIs appear to be decreased compared to prior study on 01/30/2019.  Summary: Right: Resting right ankle-brachial index is within normal range. No evidence of significant right lower extremity arterial disease. The right toe-brachial index is normal. Left: Resting left ankle-brachial index indicates moderate left lower extremity arterial disease. The left toe-brachial index is abnormal.  *See table(s) above for measurements and observations.  Electronically signed by Levora Dredge MD on 02/01/2020 at 11:46:14 AM.    Final    ECHOCARDIOGRAM COMPLETE  Result Date: 02/13/2020    ECHOCARDIOGRAM REPORT   Patient Name:   CHACE BISCH Date of Exam: 02/13/2020 Medical Rec #:  [id number]     Height:       62.0 in Accession #:    [id number]    Weight:       149.0 lb Date of Birth:  [birth year]     BSA:          1.687 m Patient Age:    86 years      BP:  149/53 mmHg Patient Gender: F             HR:           81 bpm. Exam Location:  [facility] Procedure: Color Doppler, Cardiac Doppler, 2D Echo and Intracardiac            Opacification Agent Indications:     NSTEMI 121.4  History:         Patient has no prior history of Echocardiogram examinations.                  Risk Factors:Hypertension and Diabetes. PAD.  Sonographer:     Cristela Blue RDCS (AE) Referring Phys:  [id number] Vernetta Honey MANSY  Diagnosing Phys: Cristal Deer End MD  Sonographer Comments: No subcostal window, suboptimal apical window and suboptimal parasternal window. Image acquisition challenging due to COPD. IMPRESSIONS  1. Left ventricular ejection fraction, by estimation, is 45 to 50%. The left ventricle has mildly decreased function. The left ventricle demonstrates regional wall motion abnormalities (see scoring diagram/findings for description). There is moderate left ventricular hypertrophy. Left ventricular diastolic parameters are consistent with Grade II diastolic dysfunction (pseudonormalization). Elevated left atrial pressure. There is severe hypokinesis of the left ventricular, entire anterolateral wall and inferolateral wall.  2. Right ventricular systolic function is normal. The right ventricular size is normal. Tricuspid regurgitation signal is inadequate for assessing PA pressure.  3. Left atrial size was mildly dilated.  4. The mitral valve is grossly normal. Moderate mitral valve regurgitation. No evidence of mitral stenosis.  5. Unable to determine valve morphology due to image quality. Aortic valve regurgitation is moderate. Severe aortic valve stenosis. Aortic valve mean gradient measures 40.0 mmHg. Aortic valve Vmax measures 3.98 m/s.  6. Pulmonic valve regurgitation not well assessed. FINDINGS  Left Ventricle: Left ventricular ejection fraction, by estimation, is 45 to 50%. The left ventricle has mildly decreased function. The left ventricle demonstrates regional wall motion abnormalities. Severe hypokinesis of the left ventricular, entire anterolateral wall and inferolateral wall. Definity contrast agent was given IV to delineate the left ventricular endocardial borders. The left ventricular internal cavity size was normal in size. There is moderate left ventricular hypertrophy. Left ventricular diastolic parameters are consistent with Grade II diastolic dysfunction (pseudonormalization). Elevated left atrial  pressure. Right Ventricle: The right ventricular size is normal. Right vetricular wall thickness was not assessed. Right ventricular systolic function is normal. Tricuspid regurgitation signal is inadequate for assessing PA pressure. Left Atrium: Left atrial size was mildly dilated. Right Atrium: Right atrial size was normal in size. Pericardium: The pericardium was not well visualized. Mitral Valve: The mitral valve is grossly normal. Moderate mitral annular calcification. Moderate mitral valve regurgitation. No evidence of mitral valve stenosis. Tricuspid Valve: The tricuspid valve is not well visualized. Tricuspid valve regurgitation is trivial. Aortic Valve: Unable to determine valve morphology due to image quality.. There is severe thickening and mild calcification of the aortic valve. Aortic valve regurgitation is moderate. Severe aortic stenosis is present. There is severe thickening of the aortic valve. There is mild calcification of the aortic valve. Aortic valve mean gradient measures 40.0 mmHg. Aortic valve peak gradient measures 63.4 mmHg. Aortic valve area, by VTI measures 0.65 cm. Pulmonic Valve: The pulmonic valve was not well visualized. Pulmonic valve regurgitation not well assessed. Aorta: The aortic root is normal in size and structure. Pulmonary Artery: The pulmonary artery is not well seen. Venous: The inferior vena cava was not well visualized. IAS/Shunts: The interatrial septum was not well  visualized.  LEFT VENTRICLE PLAX 2D LVIDd:         3.50 cm      Diastology LVIDs:         2.47 cm      LV e' lateral:   3.92 cm/s LV PW:         1.22 cm      LV E/e' lateral: 37.0 LV IVS:        1.05 cm      LV e' medial:    4.57 cm/s LVOT diam:     2.00 cm      LV E/e' medial:  31.7 LV SV:         57 LV SV Index:   34 LVOT Area:     3.14 cm  LV Volumes (MOD) LV vol d, MOD A2C: 96.8 ml LV vol d, MOD A4C: 110.0 ml LV vol s, MOD A2C: 48.3 ml LV vol s, MOD A4C: 57.4 ml LV SV MOD A2C:     48.5 ml LV SV MOD  A4C:     110.0 ml LV SV MOD BP:      50.5 ml RIGHT VENTRICLE RV Basal diam:  2.67 cm RV S prime:     13.60 cm/s TAPSE (M-mode): 2.5 cm LEFT ATRIUM             Index       RIGHT ATRIUM           Index LA diam:        4.40 cm 2.61 cm/m  RA Area:     13.20 cm LA Vol (A2C):   82.9 ml 49.14 ml/m RA Volume:   30.10 ml  17.84 ml/m LA Vol (A4C):   49.6 ml 29.40 ml/m LA Biplane Vol: 64.4 ml 38.17 ml/m  AORTIC VALVE                    PULMONIC VALVE AV Area (Vmax):    0.69 cm     RVOT Peak grad: 6 mmHg AV Area (Vmean):   0.60 cm AV Area (VTI):     0.65 cm AV Vmax:           398.00 cm/s AV Vmean:          283.333 cm/s AV VTI:            0.889 m AV Peak Grad:      63.4 mmHg AV Mean Grad:      40.0 mmHg LVOT Vmax:         87.50 cm/s LVOT Vmean:        53.800 cm/s LVOT VTI:          0.183 m LVOT/AV VTI ratio: 0.21  AORTA Ao Root diam: 2.80 cm MITRAL VALVE MV Area (PHT): 3.30 cm     SHUNTS MV Decel Time: 230 msec     Systemic VTI:  0.18 m MV E velocity: 145.00 cm/s  Systemic Diam: 2.00 cm MV A velocity: 119.00 cm/s MV E/A ratio:  1.22 Cristal Deer End MD Electronically signed by Yvonne Kendall MD Signature Date/Time: 02/13/2020/10:47:14 AM    Final    VAS [place] LOWER EXTREMITY ARTERIAL DUPLEX  Result Date: 02/01/2020 LOWER EXTREMITY ARTERIAL DUPLEX STUDY Indications: Peripheral artery disease, and 08/09/2018              Bilat CIA PTA stents              Left EIA PTA stent  Right SFA to Pop PTA stent.  Vascular Interventions: 01/13/2019: PTA and Stent to the Right SFA and Popliteal                         Artery. Mechanical Thrombectomy of Occluded Rt SFA                         stents. Current ABI:            Rt 1.04, Lt .75 Comparison Study: 01/30/2019 Performing Technologist: Debbe Bales RVS  Examination Guidelines: A complete evaluation includes B-mode imaging, spectral Doppler, color Doppler, and power Doppler as needed of all accessible portions of each vessel. Bilateral testing is considered an  integral part of a complete examination. Limited examinations for reoccurring indications may be performed as noted.  +-----------+--------+-----+--------+--------+--------+ RIGHT      PSV cm/sRatioStenosisWaveformComments +-----------+--------+-----+--------+--------+--------+ CFA Distal 178                  biphasic         +-----------+--------+-----+--------+--------+--------+ DFA        213                  biphasic         +-----------+--------+-----+--------+--------+--------+ SFA Prox   303                  biphasicStent    +-----------+--------+-----+--------+--------+--------+ SFA Mid    95                   biphasicstent    +-----------+--------+-----+--------+--------+--------+ SFA Distal 69                   biphasic         +-----------+--------+-----+--------+--------+--------+ POP Distal 102                  biphasic         +-----------+--------+-----+--------+--------+--------+ ATA Distal 85                   biphasic         +-----------+--------+-----+--------+--------+--------+ PTA Distal 14                   biphasic         +-----------+--------+-----+--------+--------+--------+ PERO Distal41                   biphasic         +-----------+--------+-----+--------+--------+--------+  +-----------+--------+-----+--------+--------+--------+ LEFT       PSV cm/sRatioStenosisWaveformComments +-----------+--------+-----+--------+--------+--------+ CFA Distal 135                  biphasic         +-----------+--------+-----+--------+--------+--------+ DFA        87                   biphasic         +-----------+--------+-----+--------+--------+--------+ SFA Prox   89                   biphasic         +-----------+--------+-----+--------+--------+--------+ SFA Mid    195                  biphasic         +-----------+--------+-----+--------+--------+--------+ SFA Distal 46  biphasic         +-----------+--------+-----+--------+--------+--------+ POP Distal 70                   biphasic         +-----------+--------+-----+--------+--------+--------+ ATA Distal 49                   biphasic         +-----------+--------+-----+--------+--------+--------+ PTA Distal 0                    Absent           +-----------+--------+-----+--------+--------+--------+ PERO Distal26                   biphasic         +-----------+--------+-----+--------+--------+--------+   Summary: Right: Imaging and Waveforms obtained throughout in the Right Lower Extremity. Biphasic Waveforms seen predominantly in the Right Lower Extremity. Left: Imaging and Waveforms obtained throughout in the Left Lower Extremity. Biphasic Waveforms seen predominantly in the Left Lower Extremity.  See table(s) above for measurements and observations. Electronically signed by Levora Dredge MD on 02/01/2020 at 11:46:17 AM.    Final     Assessment & Plan    Non-ST elevation myocardial infarction-echo showed wall motion abnormalities with moderate reduced LV function.  Not a candidate for invasive evaluation at present given comorbidities.  Currently on a heparin drip aspirin and Plavix.  Continues with beta-blocker.  We will continue with this for now and when more stable consider cardiac cath.  Iron deficiency anemia-had one dark stool which is cleared up.  We will continue with heparin for now however this may need to be stopped if anemia persists.  She is at high risk for endoscopic evaluation at present.  No active bleeding at present is noted.  Respiratory failure-on oxygen supplementation.  Has underlying COPD as well as most likely diastolic failure secondary to her aortic valve disease.  Heart failure-EF mildly reduced with anterolateral hypokinesis severe AS and moderate MR.  We will continue with diuretics.  Hold ACE inhibitor due to acute kidney injury.  Creatinine increased to  1.3 although improved from 1.5 yesterday. Signed, Darlin Priestly Aslee Such MD 02/16/2020, 2:51 PM  Pager: [phone]

## 2020-02-16 NOTE — Care Management Important Message (Signed)
Important Message  Patient Details  Name: Hannah Conway MRN: [id number] Date of Birth: [birth year]   Medicare Important Message Given:  Yes     [name] 02/16/2020, 2:05 PM

## 2020-02-16 NOTE — Progress Notes (Signed)
Notified by Lab that Urine Osmolality test will be sent to [facility] for testing due to equipment issues.

## 2020-02-17 ENCOUNTER — Inpatient Hospital Stay: Payer: Medicare Other

## 2020-02-17 DIAGNOSIS — E871 Hypo-osmolality and hyponatremia: Secondary | ICD-10-CM

## 2020-02-17 LAB — CBC
HCT: 25.8 % — ABNORMAL LOW (ref 36.0–46.0)
Hemoglobin: 8.1 g/dL — ABNORMAL LOW (ref 12.0–15.0)
MCH: 25 pg — ABNORMAL LOW (ref 26.0–34.0)
MCHC: 31.4 g/dL (ref 30.0–36.0)
MCV: 79.6 fL — ABNORMAL LOW (ref 80.0–100.0)
Platelets: 403 10*3/uL — ABNORMAL HIGH (ref 150–400)
RBC: 3.24 MIL/uL — ABNORMAL LOW (ref 3.87–5.11)
RDW: 16 % — ABNORMAL HIGH (ref 11.5–15.5)
WBC: 16.9 10*3/uL — ABNORMAL HIGH (ref 4.0–10.5)
nRBC: 0.1 % (ref 0.0–0.2)

## 2020-02-17 LAB — BASIC METABOLIC PANEL
Anion gap: 13 (ref 5–15)
BUN: 43 mg/dL — ABNORMAL HIGH (ref 8–23)
CO2: 28 mmol/L (ref 22–32)
Calcium: 9.1 mg/dL (ref 8.9–10.3)
Chloride: 89 mmol/L — ABNORMAL LOW (ref 98–111)
Creatinine, Ser: 1.29 mg/dL — ABNORMAL HIGH (ref 0.44–1.00)
GFR calc Af Amer: 43 mL/min — ABNORMAL LOW (ref >60)
GFR calc non Af Amer: 37 mL/min — ABNORMAL LOW (ref >60)
Glucose, Bld: 139 mg/dL — ABNORMAL HIGH (ref 70–99)
Potassium: 3.4 mmol/L — ABNORMAL LOW (ref 3.5–5.1)
Sodium: 130 mmol/L — ABNORMAL LOW (ref 135–145)

## 2020-02-17 LAB — GLUCOSE, CAPILLARY
Glucose-Capillary: 159 mg/dL — ABNORMAL HIGH (ref 70–99)
Glucose-Capillary: 268 mg/dL — ABNORMAL HIGH (ref 70–99)
Glucose-Capillary: 203 mg/dL — ABNORMAL HIGH (ref 70–99)
Glucose-Capillary: 222 mg/dL — ABNORMAL HIGH (ref 70–99)

## 2020-02-17 LAB — MAGNESIUM: Magnesium: 2.2 mg/dL (ref 1.7–2.4)

## 2020-02-17 LAB — HEPARIN LEVEL (UNFRACTIONATED): Heparin Unfractionated: 0.55 IU/mL (ref 0.30–0.70)

## 2020-02-17 MED ORDER — INSULIN ASPART 100 UNIT/ML ~~LOC~~ SOLN
5.0000 [IU] | Freq: Three times a day (TID) | SUBCUTANEOUS | Status: DC
  Administered 2020-02-17 – 2020-02-19 (×7): 5 [IU] via SUBCUTANEOUS
  Filled 2020-02-17 (×7): qty 1

## 2020-02-17 MED ORDER — FUROSEMIDE 10 MG/ML IJ SOLN
60.0000 mg | Freq: Two times a day (BID) | INTRAMUSCULAR | Status: DC
  Administered 2020-02-18: 60 mg via INTRAVENOUS
  Filled 2020-02-17: qty 6

## 2020-02-17 MED ORDER — GUAIFENESIN-DM 100-10 MG/5ML PO SYRP
5.0000 mL | ORAL_SOLUTION | ORAL | Status: DC | PRN
  Administered 2020-02-17: 5 mL via ORAL
  Filled 2020-02-17: qty 5

## 2020-02-17 MED ORDER — POTASSIUM CHLORIDE CRYS ER 20 MEQ PO TBCR
40.0000 meq | EXTENDED_RELEASE_TABLET | ORAL | Status: AC
  Administered 2020-02-17 (×2): 40 meq via ORAL
  Filled 2020-02-17 (×2): qty 2

## 2020-02-17 MED ORDER — PANTOPRAZOLE SODIUM 40 MG IV SOLR
40.0000 mg | Freq: Two times a day (BID) | INTRAVENOUS | Status: DC
  Administered 2020-02-17 – 2020-02-22 (×10): 40 mg via INTRAVENOUS
  Filled 2020-02-17 (×11): qty 40

## 2020-02-17 MED ORDER — FUROSEMIDE 10 MG/ML IJ SOLN
40.0000 mg | Freq: Once | INTRAMUSCULAR | Status: AC
  Administered 2020-02-17: 40 mg via INTRAVENOUS

## 2020-02-17 NOTE — TOC Progression Note (Signed)
Transition of Care [facility]) - Progression Note    Patient Details  Name: Hannah Conway MRN: [id number] Date of Birth: 02/04/1930  Transition of Care [facility]) CM/SW [place], RN Phone Number: 02/17/2020, 3:48 PM  Clinical Narrative:     Met daughter and patient today.  Patient lives with daughter but she is unable to do physical care of her mother.  She can help clean and cook for her mother but due to her own disabilities unable to lift, etc her mother.  Patient would prefer [facility] but understands she will need STR at discharge.  FL2 completed and bed search started.         Expected Discharge Plan and Services                                                 Social Determinants of Health (SDOH) Interventions    Readmission Risk Interventions No flowsheet data found.

## 2020-02-17 NOTE — Plan of Care (Signed)
1 wk.

## 2020-02-17 NOTE — Progress Notes (Signed)
[facility] Cardiology    SUBJECTIVE:Patient sleeping quietly , less sob today denies pain.   Vitals:   02/17/20 0315 02/17/20 0844 02/17/20 1156 02/17/20 1305  BP: 115/72 (!) 144/69 (!) 158/53   Pulse: 66 70 68 76  Resp: 20 19 19    Temp: 98 F (36.7 C) 98.5 F (36.9 C)    TempSrc: Oral Oral    SpO2: 99% 97% 100%   Weight: 71.1 kg     Height:         Intake/Output Summary (Last 24 hours) at 02/17/2020 1318 Last data filed at 02/17/2020 0900 Gross per 24 hour  Intake 942.85 ml  Output 1425 ml  Net -482.15 ml      PHYSICAL EXAM  General: Well developed, well nourished, in no acute distress HEENT:  Normocephalic and atramatic Neck:  No JVD.  Lungs: Clear bilaterally to auscultation and percussion. Heart: HRRR . Normal S1 and S2 without gallops or murmurs.  Abdomen: Bowel sounds are positive, abdomen soft and non-tender  Msk:  Back normal, normal gait. Normal strength and tone for age. Extremities: No clubbing, cyanosis or edema.   Neuro: Alert and oriented X 3. Psych:  Good affect, responds appropriately   LABS: Basic Metabolic Panel: Recent Labs    02/16/20 0517 02/17/20 0705  NA 126* 130*  K 4.0 3.4*  CL 87* 89*  CO2 28 28  GLUCOSE 160* 139*  BUN 47* 43*  CREATININE 1.30* 1.29*  CALCIUM 8.9 9.1  MG 1.9 2.2   Liver Function Tests: No results for input(s): AST, ALT, ALKPHOS, BILITOT, PROT, ALBUMIN in the last 72 hours. No results for input(s): LIPASE, AMYLASE in the last 72 hours. CBC: Recent Labs    02/15/20 0411 02/15/20 0411 02/16/20 0517 02/17/20 0705  WBC 15.7*   < > 16.2* 16.9*  NEUTROABS 9.4*  --  11.1*  --   HGB 8.5*   < > 8.5* 8.1*  HCT 28.1*   < > 26.2* 25.8*  MCV 81.2   < > 76.8* 79.6*  PLT 409*   < > 393 403*   < > = values in this interval not displayed.   Cardiac Enzymes: No results for input(s): CKTOTAL, CKMB, CKMBINDEX, TROPONINI in the last 72 hours. BNP: Invalid input(s): POCBNP D-Dimer: No results for input(s): DDIMER in the  last 72 hours. Hemoglobin A1C: Recent Labs    02/16/20 0517  HGBA1C 6.5*   Fasting Lipid Panel: No results for input(s): CHOL, HDL, LDLCALC, TRIG, CHOLHDL, LDLDIRECT in the last 72 hours. Thyroid Function Tests: No results for input(s): TSH, T4TOTAL, T3FREE, THYROIDAB in the last 72 hours.  Invalid input(s): FREET3 Anemia Panel: Recent Labs    02/15/20 0411  VITAMINB12 460  FOLATE 16.1    DG Chest Port 1 View  Result Date: 02/16/2020 CLINICAL DATA:  Cough and respiratory distress EXAM: PORTABLE CHEST 1 VIEW COMPARISON:  February 13, 2020 FINDINGS: There is consolidation in the right base region with right pleural effusion. There is cardiomegaly with pulmonary venous hypertension. There is diffuse interstitial edema. No adenopathy. There is aortic atherosclerosis. IMPRESSION: Cardiomegaly with pulmonary vascular congestion. Diffuse interstitial edema with right pleural effusion. Suspect congestive heart failure. Superimposed airspace opacity in the right lower lung region may represent superimposed pneumonia. There may also be alveolar edema in this area. Both edema and pneumonia may present concurrently. Aortic Atherosclerosis (ICD10-I70.0). Electronically Signed   By: February 15, 2020 III M.D.   On: 02/16/2020 10:36     Echo EF=45-50% severe AS  TELEMETRY: NSR nsstw 70:  ASSESSMENT AND PLAN:  Principal Problem:   Acute hypoxemic respiratory failure (HCC) Active Problems:   Hypertension associated with type 2 diabetes mellitus (HCC)   Type 2 diabetes mellitus with stage 3 chronic kidney disease, with long-term current use of insulin (HCC)   NSTEMI (non-ST elevated myocardial infarction) (HCC)   COPD with acute exacerbation (HCC)    Plan Continue Tele F/U Ekgs troponin HR Probable NSTEMI on anticoug with IV heparin  Agree with ECHO mildly depressed LVF severe AS AFIB Supplimental 02 inhalers for SOB CHF Agree with lasix for chf/sob/edema Continue PRBC for  anemia Continue f/u ARI/CRI consider nephrology input PVD as per vascular Consider R/L cardiac cath when clinically stable   Alwyn Pea, MD 02/17/2020 1:18 PM

## 2020-02-17 NOTE — Progress Notes (Signed)
Melodie Bouillon, MD [address], [place], [place], [address] [address], [address], [place], [place], [address] Phone: [phone]  Fax: [phone]   Subjective: Patient remains on O2 via nasal cannula and still experiences shortness of breath.  Nursing at bedside and family at bedside.  They all deny any melena or active bleeding today.  Brown stool reported.  No fever or chills.   Objective: Exam: Vital signs in last 24 hours: Vitals:   02/17/20 0844 02/17/20 1156 02/17/20 1305 02/17/20 1412  BP: (!) 144/69 (!) 158/53    Pulse: 70 68 76   Resp: 19 19    Temp: 98.5 F (36.9 C)     TempSrc: Oral     SpO2: 97% 100%  96%  Weight:      Height:       Weight change: 0.953 kg  Intake/Output Summary (Last 24 hours) at 02/17/2020 1539 Last data filed at 02/17/2020 1300 Gross per 24 hour  Intake 942.85 ml  Output 525 ml  Net 417.85 ml    General: No acute distress, AAO x3 Abd: Soft, NT/ND, No HSM Skin: Warm, no rashes Neck: Supple, Trachea midline   Lab Results: Lab Results  Component Value Date   WBC 16.9 (H) 02/17/2020   HGB 8.1 (L) 02/17/2020   HCT 25.8 (L) 02/17/2020   MCV 79.6 (L) 02/17/2020   PLT 403 (H) 02/17/2020   Micro Results: Recent Results (from the past 240 hour(s))  SARS CORONAVIRUS 2 (TAT 6-24 HRS) Nasopharyngeal Nasopharyngeal Swab     Status: None   Collection Time: 02/09/20  3:43 PM   Specimen: Nasopharyngeal Swab  Result Value Ref Range Status   SARS Coronavirus 2 NEGATIVE NEGATIVE Final    Comment: (NOTE) SARS-CoV-2 target nucleic acids are NOT DETECTED.  The SARS-CoV-2 RNA is generally detectable in upper and lower respiratory specimens during the acute phase of infection. Negative results do not preclude SARS-CoV-2 infection, do not rule out co-infections with other pathogens, and should not be used as the sole basis for treatment or other patient management decisions. Negative results must be combined with clinical  observations, patient history, and epidemiological information. The expected result is Negative.  Fact Sheet for Patients: HairSlick.no  Fact Sheet for Healthcare Providers: quierodirigir.com  This test is not yet approved or cleared by the [place] FDA and  has been authorized for detection and/or diagnosis of SARS-CoV-2 by FDA under an Emergency Use Authorization (EUA). This EUA will remain  in effect (meaning this test can be used) for the duration of the COVID-19 declaration under Se ction 564(b)(1) of the Act, 21 U.S.C. section 360bbb-3(b)(1), unless the authorization is terminated or revoked sooner.  Performed at [facility], [address] [place], [place] [address]   SARS Coronavirus 2 by RT PCR (hospital order, performed in [facility] hospital lab) Nasopharyngeal Nasopharyngeal Swab     Status: None   Collection Time: 02/13/20  1:37 AM   Specimen: Nasopharyngeal Swab  Result Value Ref Range Status   SARS Coronavirus 2 NEGATIVE NEGATIVE Final    Comment: (NOTE) SARS-CoV-2 target nucleic acids are NOT DETECTED.  The SARS-CoV-2 RNA is generally detectable in upper and lower respiratory specimens during the acute phase of infection. The lowest concentration of SARS-CoV-2 viral copies this assay can detect is 250 copies / mL. A negative result does not preclude SARS-CoV-2 infection and should not be used as the sole basis for treatment or other patient management decisions.  A negative result may occur with improper specimen collection / handling, submission of specimen other than nasopharyngeal swab, presence of viral mutation(s) within the areas targeted by this assay, and inadequate number of viral copies (<250 copies / mL). A negative result must be combined with clinical observations, patient history, and epidemiological information.  Fact Sheet for Patients:     BoilerBrush.com.cy  Fact Sheet for Healthcare Providers: https://pope.com/  This test is not yet approved or  cleared by the [place] FDA and has been authorized for detection and/or diagnosis of SARS-CoV-2 by FDA under an Emergency Use Authorization (EUA).  This EUA will remain in effect (meaning this test can be used) for the duration of the COVID-19 declaration under Section 564(b)(1) of the Act, 21 U.S.C. section 360bbb-3(b)(1), unless the authorization is terminated or revoked sooner.  Performed at [facility], [address] [place], [place] [address]   MRSA PCR Screening     Status: None   Collection Time: 02/16/20  4:53 PM   Specimen: Nasopharyngeal  Result Value Ref Range Status   MRSA by PCR NEGATIVE NEGATIVE Final    Comment:        The GeneXpert MRSA Assay (FDA approved for NASAL specimens only), is one component of a comprehensive MRSA colonization surveillance program. It is not intended to diagnose MRSA infection nor to guide or monitor treatment for MRSA infections. Performed at [facility], [address] [place], [place] [address]    Studies/Results: [facility] Chest [place] 1 View  Result Date: 02/17/2020 CLINICAL DATA:  Hypoxia, current smoker EXAM: PORTABLE CHEST 1 VIEW COMPARISON:  Chest radiograph from one day prior. FINDINGS: Stable cardiomediastinal silhouette with mild cardiomegaly. No pneumothorax. Small stable right pleural effusion. No left pleural effusion. Diffuse prominence of the parahilar interstitial markings, stable. Patchy opacities at both lung bases, stable. IMPRESSION: 1. Stable mild cardiomegaly with diffuse prominence of the parahilar interstitial markings, suggesting pulmonary edema. 2. Stable small right pleural effusion. 3. Stable patchy bibasilar lung opacities, favor atelectasis, cannot exclude a component of aspiration or pneumonia. Electronically Signed   By: Delbert Phenix M.D.   On: 02/17/2020 15:36   DG Chest Port 1 View  Result Date: 02/16/2020 CLINICAL DATA:  Cough and respiratory distress EXAM: PORTABLE CHEST 1 VIEW COMPARISON:  February 13, 2020 FINDINGS: There is consolidation in the right base region with right pleural effusion. There is cardiomegaly with pulmonary venous hypertension. There is diffuse interstitial edema. No adenopathy. There is aortic atherosclerosis. IMPRESSION: Cardiomegaly with pulmonary vascular congestion. Diffuse interstitial edema with right pleural effusion. Suspect congestive heart failure. Superimposed airspace opacity in the right lower lung region may represent superimposed pneumonia. There may also be alveolar edema in this area. Both edema and pneumonia may present concurrently. Aortic Atherosclerosis (ICD10-I70.0). Electronically Signed   By: Bretta Bang III M.D.   On: 02/16/2020 10:36   Medications:  Scheduled Meds: . aspirin EC  81 mg Oral Daily  . buPROPion  150 mg Oral Daily  . calcium-vitamin D  1 tablet Oral Q lunch  . clopidogrel  75 mg Oral Daily  . feeding supplement (ENSURE ENLIVE)  237 mL Oral BID BM  . furosemide  40 mg Intravenous Daily  . insulin aspart  0-15 Units Subcutaneous TID WC  . insulin aspart  5 Units Subcutaneous TID WC  . insulin glargine  28 Units Subcutaneous QHS  . ipratropium-albuterol  3 mL Nebulization Q6H  . metoprolol tartrate  12.5 mg Oral BID  .  multivitamin with minerals  1 tablet Oral Daily  . omega-3 acid ethyl esters  1 g Oral Daily  . pantoprazole  40 mg Oral Daily  . pravastatin  40 mg Oral q1800  . predniSONE  40 mg Oral Q breakfast  . sodium chloride flush  3 mL Intravenous Q12H   Continuous Infusions: . sodium chloride    . ceFEPime (MAXIPIME) IV 2 g (02/17/20 1210)  . heparin 900 Units/hr (02/16/20 1447)   PRN Meds:.sodium chloride, acetaminophen, albuterol, ALPRAZolam, ondansetron (ZOFRAN) IV, sodium chloride flush, zolpidem   Assessment: Principal  Problem:   Acute hypoxemic respiratory failure (HCC) Active Problems:   Hypertension associated with type 2 diabetes mellitus (HCC)   Type 2 diabetes mellitus with stage 3 chronic kidney disease, with long-term current use of insulin (HCC)   NSTEMI (non-ST elevated myocardial infarction) (HCC)   COPD with acute exacerbation (HCC)    Plan: As previously noted in Dr. Johnney Killian notes, patient remains high risk for endoscopic procedures  In the absence of active GI bleeding and continued shortness of breath issues, NSTEMI endoscopy would be higher risks of benefits  Continue PPI Continue serial CBCs and transfuse PRN Avoid NSAIDs Maintain 2 large-bore IV lines Please page GI with any acute hemodynamic changes, or signs of active GI bleeding    LOS: 4 days   Melodie Bouillon, MD 02/17/2020, 3:39 PM

## 2020-02-17 NOTE — Progress Notes (Signed)
Patient Name: Hannah Conway Date of Encounter: 02/17/2020  Hospital Problem List     Principal Problem:   Acute hypoxemic respiratory failure (HCC) Active Problems:   Hypertension associated with type 2 diabetes mellitus (HCC)   Type 2 diabetes mellitus with stage 3 chronic kidney disease, with long-term current use of insulin (HCC)   NSTEMI (non-ST elevated myocardial infarction) (HCC)   COPD with acute exacerbation [facility](HCC)    Patient Profile     86 year old female with history of peripheral vascular disease, diabetes who was in preop in preparation for an angiogram of her lower extremities by vascular surgery.  She became acutely short of breath which had started in retrospect 4 days earlier but worsened on presentation to the hospital.  She was sent to the emergency room where she complained of a dry cough.  EKG showed nondiagnostic changes for ischemia.  She had elevated troponins to 2180 which have subsequently diminished.  She was felt to have a non-ST elevation myocardial infarction.  Echo revealed ejection fraction 45 to 50% with hypokinesis of the anterolateral wall.  She had severe aortic stenosis with a mean gradient of 40 mmHg with a V-max of 3.98 m/s.  Her hospital course was complicated by anemia requiring transfusion as well as increasing respiratory distress.  Chest x-ray showed diffuse interstitial opacity in the lower lobes.  Subjective   Feels somewhat better since blood transfusion.  Still has insomnia.  Somewhat less short of breath.  Inpatient Medications    . aspirin EC  81 mg Oral Daily  . buPROPion  150 mg Oral Daily  . calcium-vitamin D  1 tablet Oral Q lunch  . clopidogrel  75 mg Oral Daily  . feeding supplement (ENSURE ENLIVE)  237 mL Oral BID BM  . furosemide  40 mg Intravenous Daily  . insulin aspart  0-15 Units Subcutaneous TID WC  . insulin aspart  5 Units Subcutaneous TID WC  . insulin glargine  28 Units Subcutaneous QHS  . ipratropium-albuterol  3  mL Nebulization Q6H  . metoprolol tartrate  12.5 mg Oral BID  . multivitamin with minerals  1 tablet Oral Daily  . omega-3 acid ethyl esters  1 g Oral Daily  . pantoprazole  40 mg Oral Daily  . pravastatin  40 mg Oral q1800  . predniSONE  40 mg Oral Q breakfast  . sodium chloride flush  3 mL Intravenous Q12H    Vital Signs    Vitals:   02/16/20 2007 02/17/20 0146 02/17/20 0315 02/17/20 0844  BP:   115/72 (!) 144/69  Pulse:   66 70  Resp:   20 19  Temp:   98 F (36.7 C) 98.5 F (36.9 C)  TempSrc:   Oral Oral  SpO2: 95% 96% 99% 97%  Weight:   71.1 kg   Height:        Intake/Output Summary (Last 24 hours) at 02/17/2020 1101 Last data filed at 02/17/2020 0900 Gross per 24 hour  Intake 942.85 ml  Output 1425 ml  Net -482.15 ml   Filed Weights   02/14/20 0500 02/16/20 0342 02/17/20 0315  Weight: 73.6 kg 70.2 kg 71.1 kg    Physical Exam    GEN: Well nourished, well developed, in no acute distress.  HEENT: normal.  Neck: Supple, no JVD, carotid bruits, or masses. Cardiac: RRR, 2/6 to 3/6 systolic murmur radiating to the outflow tract and carotids..  2+ edema in lower extremities.  Radials/DP/PT 2+ and equal bilaterally.  Respiratory:  Respirations regular and unlabored, clear to auscultation bilaterally. GI: Soft, nontender, nondistended, BS + x 4. MS: no deformity or atrophy. Skin: warm and dry, no rash. Neuro:  Strength and sensation are intact. Psych: Normal affect.  Labs    CBC Recent Labs    02/15/20 0411 02/15/20 0411 02/16/20 0517 02/17/20 0705  WBC 15.7*   < > 16.2* 16.9*  NEUTROABS 9.4*  --  11.1*  --   HGB 8.5*   < > 8.5* 8.1*  HCT 28.1*   < > 26.2* 25.8*  MCV 81.2   < > 76.8* 79.6*  PLT 409*   < > 393 403*   < > = values in this interval not displayed.   Basic Metabolic Panel Recent Labs    16/10/96 0517 02/17/20 0705  NA 126* 130*  K 4.0 3.4*  CL 87* 89*  CO2 28 28  GLUCOSE 160* 139*  BUN 47* 43*  CREATININE 1.30* 1.29*  CALCIUM 8.9  9.1  MG 1.9 2.2   Liver Function Tests No results for input(s): AST, ALT, ALKPHOS, BILITOT, PROT, ALBUMIN in the last 72 hours. No results for input(s): LIPASE, AMYLASE in the last 72 hours. Cardiac Enzymes No results for input(s): CKTOTAL, CKMB, CKMBINDEX, TROPONINI in the last 72 hours. BNP No results for input(s): BNP in the last 72 hours. D-Dimer No results for input(s): DDIMER in the last 72 hours. Hemoglobin A1C Recent Labs    02/16/20 0517  HGBA1C 6.5*   Fasting Lipid Panel No results for input(s): CHOL, HDL, LDLCALC, TRIG, CHOLHDL, LDLDIRECT in the last 72 hours. Thyroid Function Tests No results for input(s): TSH, T4TOTAL, T3FREE, THYROIDAB in the last 72 hours.  Invalid input(s): FREET3  Telemetry    Sinus arrhythmia  ECG    Sinus rhythm with PACs.  No ischemia  Radiology    DG Chest Port 1 View  Result Date: 02/16/2020 CLINICAL DATA:  Cough and respiratory distress EXAM: PORTABLE CHEST 1 VIEW COMPARISON:  February 13, 2020 FINDINGS: There is consolidation in the right base region with right pleural effusion. There is cardiomegaly with pulmonary venous hypertension. There is diffuse interstitial edema. No adenopathy. There is aortic atherosclerosis. IMPRESSION: Cardiomegaly with pulmonary vascular congestion. Diffuse interstitial edema with right pleural effusion. Suspect congestive heart failure. Superimposed airspace opacity in the right lower lung region may represent superimposed pneumonia. There may also be alveolar edema in this area. Both edema and pneumonia may present concurrently. Aortic Atherosclerosis (ICD10-I70.0). Electronically Signed   By: Bretta Bang III M.D.   On: 02/16/2020 10:36   DG Chest Portable 1 View  Result Date: 02/13/2020 CLINICAL DATA:  Dyspnea EXAM: PORTABLE CHEST 1 VIEW COMPARISON:  04/28/2013 FINDINGS: Diffuse interstitial opacity, worst in the lower lobes. No pneumothorax or sizable pleural effusion. IMPRESSION: Diffuse  interstitial opacity, worst in the lower lobes, likely indicating pulmonary edema. Electronically Signed   By: Deatra Robinson M.D.   On: 02/13/2020 02:09   VAS [place] ABI WITH/WO TBI  Result Date: 02/01/2020 LOWER EXTREMITY DOPPLER STUDY Indications: Peripheral artery disease, and 08/09/2018              Bilat CIA PTA stents              Left EIA PTA stent              Right SFA to Pop PTA stent.  Vascular Interventions: 01/13/2019: PTA and Stent to the Right SFA and Popliteal  Artery. Mechanical Thrombectomy of Occluded Rt SFA                         stents. Comparison Study: 01/30/2019 Performing Technologist: Debbe Bales RVS  Examination Guidelines: A complete evaluation includes at minimum, Doppler waveform signals and systolic blood pressure reading at the level of bilateral brachial, anterior tibial, and posterior tibial arteries, when vessel segments are accessible. Bilateral testing is considered an integral part of a complete examination. Photoelectric Plethysmograph (PPG) waveforms and toe systolic pressure readings are included as required and additional duplex testing as needed. Limited examinations for reoccurring indications may be performed as noted.  ABI Findings: +---------+------------------+-----+--------+--------+ Right    Rt Pressure (mmHg)IndexWaveformComment  +---------+------------------+-----+--------+--------+ Brachial 132                                     +---------+------------------+-----+--------+--------+ ATA      143               1.04 biphasic         +---------+------------------+-----+--------+--------+ PTA      130               0.94 biphasic         +---------+------------------+-----+--------+--------+ Great Toe110               0.80 Normal           +---------+------------------+-----+--------+--------+ +---------+------------------+-----+--------+-------+ Left     Lt Pressure (mmHg)IndexWaveformComment  +---------+------------------+-----+--------+-------+ Brachial 138                                    +---------+------------------+-----+--------+-------+ ATA      99                0.72 biphasic        +---------+------------------+-----+--------+-------+ PERO     103               0.75 biphasic        +---------+------------------+-----+--------+-------+ Great Toe71                0.51 Abnormal        +---------+------------------+-----+--------+-------+ +-------+-----------+-----------+------------+------------+ ABI/TBIToday's ABIToday's TBIPrevious ABIPrevious TBI +-------+-----------+-----------+------------+------------+ Right  1.04       .80        1.04        .55          +-------+-----------+-----------+------------+------------+ Left   .75        .51        .73         .83          +-------+-----------+-----------+------------+------------+ Right ABIs appear essentially unchanged compared to prior study on 01/30/2019. Left ABIs appear essentially unchanged compared to prior study on 01/30/2019. Rt TBIs appear to be increased compared to prior study on 01/30/2019. Lt TBIs appear to be decreased compared to prior study on 01/30/2019.  Summary: Right: Resting right ankle-brachial index is within normal range. No evidence of significant right lower extremity arterial disease. The right toe-brachial index is normal. Left: Resting left ankle-brachial index indicates moderate left lower extremity arterial disease. The left toe-brachial index is abnormal.  *See table(s) above for measurements and observations.  Electronically signed by Levora Dredge MD on 02/01/2020 at 11:46:14 AM.    Final    ECHOCARDIOGRAM COMPLETE  Result  Date: 02/13/2020    ECHOCARDIOGRAM REPORT   Patient Name:   SUMMERS BUENDIA Date of Exam: 02/13/2020 Medical Rec #:  [id number]     Height:       62.0 in Accession #:    [id number]    Weight:       149.0 lb Date of Birth:  [birth year]     BSA:           1.687 m Patient Age:    89 years      BP:           149/53 mmHg Patient Gender: F             HR:           81 bpm. Exam Location:  [facility] Procedure: Color Doppler, Cardiac Doppler, 2D Echo and Intracardiac            Opacification Agent Indications:     NSTEMI 121.4  History:         Patient has no prior history of Echocardiogram examinations.                  Risk Factors:Hypertension and Diabetes. PAD.  Sonographer:     Cristela Blue RDCS (AE) Referring Phys:  [id number] Vernetta Honey MANSY Diagnosing Phys: Cristal Deer End MD  Sonographer Comments: No subcostal window, suboptimal apical window and suboptimal parasternal window. Image acquisition challenging due to COPD. IMPRESSIONS  1. Left ventricular ejection fraction, by estimation, is 45 to 50%. The left ventricle has mildly decreased function. The left ventricle demonstrates regional wall motion abnormalities (see scoring diagram/findings for description). There is moderate left ventricular hypertrophy. Left ventricular diastolic parameters are consistent with Grade II diastolic dysfunction (pseudonormalization). Elevated left atrial pressure. There is severe hypokinesis of the left ventricular, entire anterolateral wall and inferolateral wall.  2. Right ventricular systolic function is normal. The right ventricular size is normal. Tricuspid regurgitation signal is inadequate for assessing PA pressure.  3. Left atrial size was mildly dilated.  4. The mitral valve is grossly normal. Moderate mitral valve regurgitation. No evidence of mitral stenosis.  5. Unable to determine valve morphology due to image quality. Aortic valve regurgitation is moderate. Severe aortic valve stenosis. Aortic valve mean gradient measures 40.0 mmHg. Aortic valve Vmax measures 3.98 m/s.  6. Pulmonic valve regurgitation not well assessed. FINDINGS  Left Ventricle: Left ventricular ejection fraction, by estimation, is 45 to 50%. The left ventricle has mildly decreased function. The left ventricle  demonstrates regional wall motion abnormalities. Severe hypokinesis of the left ventricular, entire anterolateral wall and inferolateral wall. Definity contrast agent was given IV to delineate the left ventricular endocardial borders. The left ventricular internal cavity size was normal in size. There is moderate left ventricular hypertrophy. Left ventricular diastolic parameters are consistent with Grade II diastolic dysfunction (pseudonormalization). Elevated left atrial pressure. Right Ventricle: The right ventricular size is normal. Right vetricular wall thickness was not assessed. Right ventricular systolic function is normal. Tricuspid regurgitation signal is inadequate for assessing PA pressure. Left Atrium: Left atrial size was mildly dilated. Right Atrium: Right atrial size was normal in size. Pericardium: The pericardium was not well visualized. Mitral Valve: The mitral valve is grossly normal. Moderate mitral annular calcification. Moderate mitral valve regurgitation. No evidence of mitral valve stenosis. Tricuspid Valve: The tricuspid valve is not well visualized. Tricuspid valve regurgitation is trivial. Aortic Valve: Unable to determine valve morphology due to image quality.. There is severe thickening and mild calcification of the  aortic valve. Aortic valve regurgitation is moderate. Severe aortic stenosis is present. There is severe thickening of the aortic valve. There is mild calcification of the aortic valve. Aortic valve mean gradient measures 40.0 mmHg. Aortic valve peak gradient measures 63.4 mmHg. Aortic valve area, by VTI measures 0.65 cm. Pulmonic Valve: The pulmonic valve was not well visualized. Pulmonic valve regurgitation not well assessed. Aorta: The aortic root is normal in size and structure. Pulmonary Artery: The pulmonary artery is not well seen. Venous: The inferior vena cava was not well visualized. IAS/Shunts: The interatrial septum was not well visualized.  LEFT VENTRICLE PLAX  2D LVIDd:         3.50 cm      Diastology LVIDs:         2.47 cm      LV e' lateral:   3.92 cm/s LV PW:         1.22 cm      LV E/e' lateral: 37.0 LV IVS:        1.05 cm      LV e' medial:    4.57 cm/s LVOT diam:     2.00 cm      LV E/e' medial:  31.7 LV SV:         57 LV SV Index:   34 LVOT Area:     3.14 cm  LV Volumes (MOD) LV vol d, MOD A2C: 96.8 ml LV vol d, MOD A4C: 110.0 ml LV vol s, MOD A2C: 48.3 ml LV vol s, MOD A4C: 57.4 ml LV SV MOD A2C:     48.5 ml LV SV MOD A4C:     110.0 ml LV SV MOD BP:      50.5 ml RIGHT VENTRICLE RV Basal diam:  2.67 cm RV S prime:     13.60 cm/s TAPSE (M-mode): 2.5 cm LEFT ATRIUM             Index       RIGHT ATRIUM           Index LA diam:        4.40 cm 2.61 cm/m  RA Area:     13.20 cm LA Vol (A2C):   82.9 ml 49.14 ml/m RA Volume:   30.10 ml  17.84 ml/m LA Vol (A4C):   49.6 ml 29.40 ml/m LA Biplane Vol: 64.4 ml 38.17 ml/m  AORTIC VALVE                    PULMONIC VALVE AV Area (Vmax):    0.69 cm     RVOT Peak grad: 6 mmHg AV Area (Vmean):   0.60 cm AV Area (VTI):     0.65 cm AV Vmax:           398.00 cm/s AV Vmean:          283.333 cm/s AV VTI:            0.889 m AV Peak Grad:      63.4 mmHg AV Mean Grad:      40.0 mmHg LVOT Vmax:         87.50 cm/s LVOT Vmean:        53.800 cm/s LVOT VTI:          0.183 m LVOT/AV VTI ratio: 0.21  AORTA Ao Root diam: 2.80 cm MITRAL VALVE MV Area (PHT): 3.30 cm     SHUNTS MV Decel Time: 230 msec     Systemic VTI:  0.18  m MV E velocity: 145.00 cm/s  Systemic Diam: 2.00 cm MV A velocity: 119.00 cm/s MV E/A ratio:  1.22 Yvonne Kendall MD Electronically signed by Yvonne Kendall MD Signature Date/Time: 02/13/2020/10:47:14 AM    Final    VAS [place] LOWER EXTREMITY ARTERIAL DUPLEX  Result Date: 02/01/2020 LOWER EXTREMITY ARTERIAL DUPLEX STUDY Indications: Peripheral artery disease, and 08/09/2018              Bilat CIA PTA stents              Left EIA PTA stent              Right SFA to Pop PTA stent.  Vascular Interventions: 01/13/2019: PTA  and Stent to the Right SFA and Popliteal                         Artery. Mechanical Thrombectomy of Occluded Rt SFA                         stents. Current ABI:            Rt 1.04, Lt .75 Comparison Study: 01/30/2019 Performing Technologist: Debbe Bales RVS  Examination Guidelines: A complete evaluation includes B-mode imaging, spectral Doppler, color Doppler, and power Doppler as needed of all accessible portions of each vessel. Bilateral testing is considered an integral part of a complete examination. Limited examinations for reoccurring indications may be performed as noted.  +-----------+--------+-----+--------+--------+--------+ RIGHT      PSV cm/sRatioStenosisWaveformComments +-----------+--------+-----+--------+--------+--------+ CFA Distal 178                  biphasic         +-----------+--------+-----+--------+--------+--------+ DFA        213                  biphasic         +-----------+--------+-----+--------+--------+--------+ SFA Prox   303                  biphasicStent    +-----------+--------+-----+--------+--------+--------+ SFA Mid    95                   biphasicstent    +-----------+--------+-----+--------+--------+--------+ SFA Distal 69                   biphasic         +-----------+--------+-----+--------+--------+--------+ POP Distal 102                  biphasic         +-----------+--------+-----+--------+--------+--------+ ATA Distal 85                   biphasic         +-----------+--------+-----+--------+--------+--------+ PTA Distal 14                   biphasic         +-----------+--------+-----+--------+--------+--------+ PERO Distal41                   biphasic         +-----------+--------+-----+--------+--------+--------+  +-----------+--------+-----+--------+--------+--------+ LEFT       PSV cm/sRatioStenosisWaveformComments +-----------+--------+-----+--------+--------+--------+ CFA Distal  135                  biphasic         +-----------+--------+-----+--------+--------+--------+ DFA        87  biphasic         +-----------+--------+-----+--------+--------+--------+ SFA Prox   89                   biphasic         +-----------+--------+-----+--------+--------+--------+ SFA Mid    195                  biphasic         +-----------+--------+-----+--------+--------+--------+ SFA Distal 46                   biphasic         +-----------+--------+-----+--------+--------+--------+ POP Distal 70                   biphasic         +-----------+--------+-----+--------+--------+--------+ ATA Distal 49                   biphasic         +-----------+--------+-----+--------+--------+--------+ PTA Distal 0                    Absent           +-----------+--------+-----+--------+--------+--------+ PERO Distal26                   biphasic         +-----------+--------+-----+--------+--------+--------+   Summary: Right: Imaging and Waveforms obtained throughout in the Right Lower Extremity. Biphasic Waveforms seen predominantly in the Right Lower Extremity. Left: Imaging and Waveforms obtained throughout in the Left Lower Extremity. Biphasic Waveforms seen predominantly in the Left Lower Extremity.  See table(s) above for measurements and observations. Electronically signed by Levora Dredge MD on 02/01/2020 at 11:46:17 AM.    Final     Assessment & Plan    1.  Non-ST elevation myocardial infarction-echo showed wall motion abnormalities with moderate reduced LV function.  Not a candidate for invasive evaluation at present given comorbidities.  Currently on a heparin drip aspirin and Plavix.  Continues with beta-blocker.  We will continue with this for now and when more stable consider cardiac cath to evaluate coronary anatomy as well as aortic valve..  Renal function currently stable at 1.29.  Potassium 3.4.  Continue with metoprolol  tartrate at 12.5 mg twice daily.  Also continue with pravastatin at 40 mg daily.  2.  Iron deficiency anemia-had one dark stool however no active bleeding while on anticoagulation and antiplatelet therapy.  Hemoglobin 8.1 this morning..  We will continue with heparin for now however this may need to be stopped if anemia persists.  She is at high risk for endoscopic evaluation at present.  No active bleeding at present is noted.  3.  Respiratory failure-on oxygen supplementation.  Has underlying COPD as well as most likely diastolic failure secondary to her aortic valve disease.  4.  Heart failure-EF mildly reduced with anterolateral hypokinesis severe AS and moderate MR.  We will continue with diuretics.   Signed, Darlin Priestly Kimila Papaleo MD 02/17/2020, 11:01 AM  Pager: [phone]

## 2020-02-17 NOTE — Progress Notes (Signed)
ANTICOAGULATION CONSULT NOTE  Pharmacy Consult for Heparin Indication: chest pain/ACS  Allergies  Allergen Reactions  . Atorvastatin Other (See Comments)    "Felt as if she would pass out"  . Simvastatin Other (See Comments)    "Felt as if she would pass out"     Patient Measurements: Height: 5\' 2"  (157.5 cm) Weight: 71.1 kg (156 lb 12.8 oz) IBW/kg (Calculated) : 50.1 HEPARIN DW (KG): 65.4  Vital Signs: Temp: 98 F (36.7 C) (08/21 0315) Temp Source: Oral (08/21 0315) BP: 115/72 (08/21 0315) Pulse Rate: 66 (08/21 0315)  Labs: Recent Labs    02/14/20 1116 02/14/20 1810 02/14/20 2033 02/15/20 0411 02/15/20 0411 02/16/20 0517 02/17/20 0705  HGB   < >  --  7.9* 8.5*   < > 8.5* 8.1*  HCT   < >  --  25.5* 28.1*  --  26.2* 25.8*  PLT  --   --   --  409*  --  393 403*  HEPARINUNFRC   < >  --   --  0.39  --  0.39 0.55  CREATININE  --   --   --  1.52*  --  1.30* 1.29*  TROPONINIHS  --  1,392* 1,517*  --   --   --   --    < > = values in this interval not displayed.    Estimated Creatinine Clearance: 27.8 mL/min (A) (by C-G formula based on SCr of 1.29 mg/dL (H)).  Medical History: Past Medical History:  Diagnosis Date  . Diabetes mellitus without complication (HCC)   . Hypertension   . Peripheral artery disease (HCC)     Medications:  Medications Prior to Admission  Medication Sig Dispense Refill Last Dose  . acetaminophen (TYLENOL) 500 MG tablet Take 1,000 mg by mouth every 6 (six) hours as needed for moderate pain.    02/12/2020 at 2000  . aspirin EC 81 MG tablet Take 81 mg by mouth daily.    02/12/2020 at 1000  . benazepril-hydrochlorthiazide (LOTENSIN HCT) 20-12.5 MG tablet Take 1 tablet by mouth daily.   02/12/2020 at 1000  . buPROPion (WELLBUTRIN XL) 150 MG 24 hr tablet Take 150 mg by mouth daily.   02/12/2020 at 1000  . Calcium Citrate-Vitamin D (CALCIUM CITRATE + D3 PO) Take 2 tablets by mouth daily with lunch.   02/12/2020 at 1800  . Cinnamon 500 MG TABS Take  500 mg by mouth daily.   02/12/2020 at 1000  . clopidogrel (PLAVIX) 75 MG tablet Take 1 tablet by mouth once daily with breakfast (Patient taking differently: Take 75 mg by mouth daily. ) 90 tablet 0 02/12/2020 at 1000  . Multiple Vitamin (MULTIVITAMIN WITH MINERALS) TABS tablet Take 1 tablet by mouth daily. Complete Multivitamin   02/12/2020 at 1000  . NOVOLOG MIX 70/30 FLEXPEN (70-30) 100 UNIT/ML FlexPen Inject 35-55 Units into the skin See admin instructions. Inject 55 units subcutaneously in the morning and 35 in the evening  1 02/12/2020 at 1800  . Omega-3 Fatty Acids (FISH OIL) 1000 MG CAPS Take 1,000 mg by mouth 2 (two) times daily.   02/12/2020 at 2000  . pravastatin (PRAVACHOL) 40 MG tablet Take 40 mg by mouth daily.   02/12/2020 at 2000  . oxyCODONE (OXY IR/ROXICODONE) 5 MG immediate release tablet Take 1 tablet (5 mg total) by mouth every 6 (six) hours as needed for moderate pain or severe pain. (Patient not taking: Reported on 01/05/2019) 20 tablet 0     Assessment:  Heparin initiated for ACS/STEMI.  No anticoagulants PTA per med list. Baseline CBC with Hgb of 8 which appears c/w patient's baseline. Baseline aPTT, PT-INR WNL.   8/18 0518 HL 0.34  8/18 1116 HL 0.32 8/19 0411 HL 0.39 8/20 0517 HL 0.39 8/21 0705 HL 0.55  Goal of Therapy:  Heparin level 0.3-0.7 units/ml Monitor platelets by anticoagulation protocol: Yes   Plan:  Heparin level is therapeutic.  Will continue heparin infusion at 900 units/hr. Recheck heparin level and CBC with AM labs.    Rasool Rommel A, PharmD 02/17/2020,8:15 AM

## 2020-02-17 NOTE — Evaluation (Signed)
Occupational Therapy Evaluation Patient Details Name: Hannah Conway MRN: [id number] DOB: 1929/08/27 Today's Date: 02/17/2020    History of Present Illness Hannah Conway is an 86 y/o female with complaints of worsening dyspnea x 4 days and was admitted for NSTEMI. PMH includes DM, HTN, and PAD.   Clinical Impression   Pt lying in bed on 3L O2 via nasal cannula with daughter at bedside. Pt O2 sats 95% and HR 74.  Pt agreeable to OT evaluation to sit at EOB. Pt report having hard time breathing- MD come in for check up - Discuss with pt and daughter options for after acute care - pt declined the last 3-6 months significantly and had about 3 falls - per pt missing chair most of time or LE giving out. Bilateral UE AROM WFL but strength 3/5 -Upon attempt to sit pt up - pt had anxiety attack - having hard time breathing, coughing non productive- O2 sats dropping into the 80's. MD arrive and turning O2 up and nsg giving pt breathing tx. Pt presents with decreased strength, functionall mobility, sitting and standing balance, functional activity tolerance, and difficulty breathing with anxiety component . Recommend skilled OT itn this setting to address current deficits and STR at discharge to optimize return to PLOF for ADLs and functional mobility. If pt and family refuse STR, recommend HHOT. Pt left with nsg and MD.    Follow Up Recommendations  Home health OT;SNF    Equipment Recommendations  Tub/shower seat    Recommendations for Other Services PT consult     Precautions / Restrictions Precautions Precautions: Fall Precaution Comments: high fall risk Restrictions Weight Bearing Restrictions: No      Mobility Bed Mobility Overal bed mobility: Needs Assistance Bed Mobility: Supine to Sit           General bed mobility comments: attempted -pt had anxiety attack - could not breath , increase coughing- non productive - O2 sats dropped - MD and nursing come in to increase O2 and gave pt  breathing tx  Transfers Overall transfer level: Needs assistance               General transfer comment: Pt PT eval yssterday Max A    Balance Overall balance assessment: Needs assistance   Sitting balance-[name]: Fair                                     ADL either performed or assessed with clinical judgement   ADL                                         General ADL Comments: max A for UB and LB ADL's -     Vision Baseline Vision/History: Wears glasses Patient Visual Report: No change from baseline       Perception     Praxis      Pertinent Vitals/Pain Pain Assessment: No/denies pain     Hand Dominance Right   Extremity/Trunk Assessment Upper Extremity Assessment Upper Extremity Assessment: Overall WFL for tasks assessed (but decrease strength and actitvitly tolerance)           Communication Communication Communication: No difficulties   Cognition Arousal/Alertness: Awake/alert Behavior During Therapy: WFL for tasks assessed/performed Overall Cognitive Status: Within Functional Limits for tasks assessed  General Comments: Pt A & O x 4.   General Comments       Exercises     Shoulder Instructions      Home Living Family/patient expects to be discharged to:: Private residence Living Arrangements: Spouse/significant other (2 daughters take care of them) Available Help at Discharge: Family;Available 24 hours/day Type of Home: House Home Access: Stairs to enter     Home Layout: One level     Bathroom Shower/Tub: Producer, television/film/video: Standard     Home Equipment: Toilet riser;Grab bars - tub/shower;Walker - 4 wheels   Additional Comments: need shower chair      Prior Functioning/Environment Level of Independence: Needs assistance        Comments: Daughter present for eval reports that pt required heavy assist for bed mobility. Daughter  also reported that pt ambulated with use of rollator however the past 3 months, they have been pushing the patient in her rollator for most distances. Occasional assist needed for transfers. Pt can do basic dressing, but family had assist for ADLs  Daughter endorses 3 falls in last 6 months resulting in bruising.Mostly miss chair sitting down        OT Problem List: Decreased strength;Decreased knowledge of use of DME or AE;Cardiopulmonary status limiting activity;Impaired UE functional use;Decreased activity tolerance;Impaired balance (sitting and/or standing);Decreased safety awareness      OT Treatment/Interventions: Self-care/ADL training;Therapeutic exercise;Patient/family education;Energy conservation;Balance training;DME and/or AE instruction    OT Goals(Current goals can be found in the care plan section) Acute Rehab OT Goals Patient Stated Goal: wants to go home with [facility] OT Goal Formulation: With patient Time For Goal Achievement: 02/24/20  OT Frequency: Min 2X/week   Barriers to D/C:            Co-evaluation              AM-PAC OT "6 Clicks" Daily Activity     Outcome Measure Help from another person eating meals?: A Little Help from another person taking care of personal grooming?: A Little Help from another person toileting, which includes using toliet, bedpan, or urinal?: Total Help from another person bathing (including washing, rinsing, drying)?: Total Help from another person to put on and taking off regular upper body clothing?: A Lot Help from another person to put on and taking off regular lower body clothing?: A Lot 6 Click Score: 12   End of Session    Activity Tolerance: Patient limited by fatigue (limited by breathing issues-) Patient left: in bed;with family/visitor present;with nursing/sitter in room (and MD)  OT Visit Diagnosis: Repeated falls (R29.6);Muscle weakness (generalized) (M62.81);History of falling (Z91.81)                Time:  1130-1200 OT Time Calculation (min): 30 min Charges:  OT General Charges $OT Visit: 1 Visit OT Evaluation $OT Eval Low Complexity: 1 Low    Joclynn Lumb OTR/L,CLT 02/17/2020, 1:37 PM

## 2020-02-17 NOTE — Progress Notes (Signed)
Per Scherrie Bateman (Director of 2A) patient's husband, Tamella Tuccillo, is allowed to visit Sunday for a few hours as an exemption to visitation policy. Due to health reasons, he must accompanied by daughter for safety.  Verbal approval given to this RN, Gerald Stabs, on 02-17-20 at [phone] by Scherrie Bateman via phone conversation.

## 2020-02-17 NOTE — Progress Notes (Signed)
PROGRESS NOTE    Hannah Conway    Code Status: Full Code  VHQ:[id number] DOB: 08-06-1929 DOA: 02/13/2020 LOS: 4 days  PCP: Mickey Farber, MD CC:  Chief Complaint  Patient presents with  . Respiratory Distress       Hospital Summary   This is an 86 year old female with a past medical history of diabetes, hypertension, PVD who presented to the ED on 8/17 with acutely worsening dyspnea and respiratory distress x4 days which had worsened overnight prior to admission.  Also had dry cough, nausea without vomiting.  Denied any chest pain or palpitations  Upon presentation to the emergency room, blood pressure was 149/53 and pulse 70 was 90% on CPAP with otherwise normal vital signs.  Labs revealed VBG with pH 7.42 and bicarbonate of 25.9.  CMP was unremarkable.  BNP was 1085.6.  High-sensitivity troponin I was 2180 and later 1848 with procalcitonin less than 0.1.  CBC showed leukocytosis of 14.5 with anemia with hemoglobin of 8 hematocrit 26.1.  Chest x-ray showed diffuse interstitial opacity worse in the lower lobes likely indicating pulmonary edema., EKG showed sinus rhythm with rate of 85 with PACs, incomplete right bundle branch block and prolonged QT interval with QTC of 503 MS.  The patient was given aspirin as well as 20 mg of IV Lasix, IV heparin bolus and drip.    She was admitted to a progressive bed and cardiology was consulted.  An echo was obtained and showed EF 45 to 50% moderate LVH, grade 2 diastolic dysfunction and severe hypokinesis of left ventricle and entire anterolateral wall and inferior lateral wall as well as moderate MR and severe AS and moderate aortic regurgitation.  8/18: Hb 6.7 -> 7.4 on repeat.  Given 1 unit PRBC transfusion in setting of NSTEMI  A & P   Principal Problem:   Acute hypoxemic respiratory failure (HCC) Active Problems:   Hypertension associated with type 2 diabetes mellitus (HCC)   Type 2 diabetes mellitus with stage 3 chronic kidney disease, with  long-term current use of insulin (HCC)   NSTEMI (non-ST elevated myocardial infarction) (HCC)   COPD with acute exacerbation (HCC)   1. NSTEMI, stable a. Echo with wall motion and valvular abnormalities as above b. Continue heparin drip, aspirin, plavix, beta blocker, pravastatin c. Continue telemetry d. Cardio: Continue aggressive medical management and consider cardiac cath prior to discharge but not a candidate currently given comorbidities  2. Acute on chronic iron deficiency anemia while on heparin, concern for chronic GI bleed a. Had an episode of dark stool which has resolved b. Anemia improved after 1 unit PRBCs c. 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 GI consulted: Likely chronic.  Given her NSTEMI, heart failure and respiratory issues she is at high risk for anesthesia and endoscopic evaluation especially since she is not acutely bleeding.  She will need endoscopy in a few weeks post cardiac event e. Received IV iron x 1  3. Acute hypoxic respiratory failure secondary to diastolic heart failure exacerbation and COPD exacerbation  Concern for HAP a. Worsened today with an episode of respiratory distress while I was at bedside with PT requiring Lasix 40 mg IV x 1 and neb treatment. Possibly exacerbated after having her Lasix decreased to QD leading to volume retention. CXR today with pulmonary edema b. 8/20: CXR concerning for both pulmonary edema now developing consolidation c. Continue bronchodilators, steroids (Day 2/5), Cefepime (Day 2/5) d. Hold antitussives as  the patient had altered mental status with these meds and has a prolonged QTc so Tessalon pearls are contraindicated  4. Diastolic heart failure exacerbation a. Echo: EF 45 to 50% moderate LVH, grade 2 diastolic dysfunction and severe hypokinesis of left ventricle and entire anterolateral wall and inferior lateral wall as well as moderate MR and severe AS  and moderate aortic regurgitation b. Weight up today after lasix was decreased yesterday c. Discontinued ACE due to AKI d. Cardio on board e. Continue heart failure protocol  f. Increase to Lasix 60 mg IV BID  5. AKI on CKD 3a, likely multifactorial: Cardiorenal, anemia, hemodynamic changes, ACE inhibitor/HCTZ, Improving a. Holding benzapril b. Monitor with Lasix changes as above c. Discussed with Dr. Thedore Mins today  6. Hyponatremia, unclear etiology a. Over diuresis with hyponatremia and hypochloremia? b. Improved with decreased lasix, monitor with changes as above c. Discussed with Dr. Thedore Mins, nephro, today  7. Hypertension a. Continue current management  8. Hyperlipidemia, Stable  9. Type 2 diabetes a. Currently on steroids b. Added on Novolog 5 u TID with meals c. Continue sliding scale and Lantus  10. Depression a. Continue Wellbutrin XL  11. Dark Urine a. UA with micro   DVT prophylaxis: Heparin   Family Communication: Patient's family at bedside has been updated   Disposition Plan:  Status is: Inpatient  Remains inpatient appropriate because:Unsafe d/c plan, IV treatments appropriate due to intensity of illness or inability to take PO and Inpatient level of care appropriate due to severity of illness   Dispo: The patient is from: Home              Anticipated d/c is to: TBD              Anticipated d/c date is: > 3 days              Patient currently is not medically stable to d/c.          Pressure injury documentation    None  Consultants  Cardiology  GI Discussed with Nephrology  Procedures  None  Antibiotics   Anti-infectives (From admission, onward)   Start     Dose/Rate Route Frequency Ordered Stop   02/16/20 1500  ceFEPIme (MAXIPIME) 2 g in sodium chloride 0.9 % 100 mL IVPB        2 g 200 mL/hr over 30 Minutes Intravenous Daily 02/16/20 1428          Subjective   At bedside today patient had an episode of respiratory  distress while about to work with PT. She was noted to have 'wet' breath sounds and wheeze, also very anxious. Briefly required increased supplemental O2 which was able to be titrated back down. Lasix and Duo neb ordered. Denied chest pain or any other issues. No significant overnight events. Urine was noted to be very dark  I followed up with the patient about an hour after treatments and she had improved symptoms.  Objective   Vitals:   02/17/20 0844 02/17/20 1156 02/17/20 1305 02/17/20 1412  BP: (!) 144/69 (!) 158/53    Pulse: 70 68 76   Resp: 19 19    Temp: 98.5 F (36.9 C)     TempSrc: Oral     SpO2: 97% 100%  96%  Weight:      Height:        Intake/Output Summary (Last 24 hours) at 02/17/2020 1557 Last data filed at 02/17/2020 1300 Gross per 24 hour  Intake 942.85  ml  Output 525 ml  Net 417.85 ml   Filed Weights   02/14/20 0500 02/16/20 0342 02/17/20 0315  Weight: 73.6 kg 70.2 kg 71.1 kg    Examination:  Physical Exam Constitutional:      General: She is in acute distress.     Appearance: She is ill-appearing.  Eyes:     Conjunctiva/sclera: Conjunctivae normal.  Cardiovascular:     Rate and Rhythm: Normal rate and regular rhythm.  Pulmonary:     Effort: Respiratory distress present.     Breath sounds: No stridor. Wheezing present. No rhonchi.  Abdominal:     General: There is no distension.  Musculoskeletal:        General: No swelling or tenderness.  Skin:    Coloration: Skin is not jaundiced or pale.  Neurological:     Mental Status: Mental status is at baseline.  Psychiatric:        Mood and Affect: Mood is anxious.     Data Reviewed: I have personally reviewed following labs and imaging studies  CBC: Recent Labs  Lab 02/13/20 0137 02/13/20 0137 02/14/20 0518 02/14/20 0518 02/14/20 0707 02/14/20 2033 02/15/20 0411 02/16/20 0517 02/17/20 0705  WBC 14.5*   < > 19.1*  --  20.1*  --  15.7* 16.2* 16.9*  NEUTROABS 8.9*  --  11.4*  --   --   --   9.4* 11.1*  --   HGB 8.0*   < > 6.7*   < > 7.4* 7.9* 8.5* 8.5* 8.1*  HCT 26.1*   < > 22.3*   < > 23.5* 25.5* 28.1* 26.2* 25.8*  MCV 79.6*   < > 80.2  --  77.3*  --  81.2 76.8* 79.6*  PLT 469*   < > 417*  --  420*  --  409* 393 403*   < > = values in this interval not displayed.   Basic Metabolic Panel: Recent Labs  Lab 02/13/20 0137 02/13/20 0656 02/14/20 0707 02/15/20 0411 02/16/20 0517 02/17/20 0705  NA 130*  --  130* 129* 126* 130*  K 4.2  --  4.3 4.3 4.0 3.4*  CL 91*  --  92* 91* 87* 89*  CO2 27  --  27 27 28 28   GLUCOSE 173*  --  52* 175* 160* 139*  BUN 24*  --  45* 48* 47* 43*  CREATININE 1.19*  --  1.51* 1.52* 1.30* 1.29*  CALCIUM 9.9  --  9.5 9.3 8.9 9.1  MG 2.8* 2.1  --  1.9 1.9 2.2   GFR: Estimated Creatinine Clearance: 27.8 mL/min (A) (by C-G formula based on SCr of 1.29 mg/dL (H)). Liver Function Tests: Recent Labs  Lab 02/13/20 0137  AST 48*  ALT 21  ALKPHOS 91  BILITOT 0.5  PROT 7.8  ALBUMIN 3.4*   No results for input(s): LIPASE, AMYLASE in the last 168 hours. No results for input(s): AMMONIA in the last 168 hours. Coagulation Profile: Recent Labs  Lab 02/13/20 0137  INR 1.0   Cardiac Enzymes: No results for input(s): CKTOTAL, CKMB, CKMBINDEX, TROPONINI in the last 168 hours. BNP (last 3 results) No results for input(s): PROBNP in the last 8760 hours. HbA1C: Recent Labs    02/16/20 0517  HGBA1C 6.5*   CBG: Recent Labs  Lab 02/16/20 1157 02/16/20 1755 02/16/20 2102 02/17/20 0846 02/17/20 1156  GLUCAP 234* 406* 324* 159* 268*   Lipid Profile: No results for input(s): CHOL, HDL, LDLCALC, TRIG, CHOLHDL, LDLDIRECT in the last  72 hours. Thyroid Function Tests: No results for input(s): TSH, T4TOTAL, FREET4, T3FREE, THYROIDAB in the last 72 hours. Anemia Panel: Recent Labs    02/15/20 0411  VITAMINB12 460  FOLATE 16.1   Sepsis Labs: Recent Labs  Lab 02/13/20 0137  PROCALCITON <0.10    Recent Results (from the past 240  hour(s))  SARS CORONAVIRUS 2 (TAT 6-24 HRS) Nasopharyngeal Nasopharyngeal Swab     Status: None   Collection Time: 02/09/20  3:43 PM   Specimen: Nasopharyngeal Swab  Result Value Ref Range Status   SARS Coronavirus 2 NEGATIVE NEGATIVE Final    Comment: (NOTE) SARS-CoV-2 target nucleic acids are NOT DETECTED.  The SARS-CoV-2 RNA is generally detectable in upper and lower respiratory specimens during the acute phase of infection. Negative results do not preclude SARS-CoV-2 infection, do not rule out co-infections with other pathogens, and should not be used as the sole basis for treatment or other patient management decisions. Negative results must be combined with clinical observations, patient history, and epidemiological information. The expected result is Negative.  Fact Sheet for Patients: HairSlick.nohttps://www.fda.gov/media/138098/download  Fact Sheet for Healthcare Providers: quierodirigir.comhttps://www.fda.gov/media/138095/download  This test is not yet approved or cleared by the [place] FDA and  has been authorized for detection and/or diagnosis of SARS-CoV-2 by FDA under an Emergency Use Authorization (EUA). This EUA will remain  in effect (meaning this test can be used) for the duration of the COVID-19 declaration under Se ction 564(b)(1) of the Act, 21 U.S.C. section 360bbb-3(b)(1), unless the authorization is terminated or revoked sooner.  Performed at [facility], [address] [place], [place] [address]   SARS Coronavirus 2 by RT PCR (hospital order, performed in [facility] hospital lab) Nasopharyngeal Nasopharyngeal Swab     Status: None   Collection Time: 02/13/20  1:37 AM   Specimen: Nasopharyngeal Swab  Result Value Ref Range Status   SARS Coronavirus 2 NEGATIVE NEGATIVE Final    Comment: (NOTE) SARS-CoV-2 target nucleic acids are NOT DETECTED.  The SARS-CoV-2 RNA is generally detectable in upper and lower respiratory specimens during the acute phase of infection.  The lowest concentration of SARS-CoV-2 viral copies this assay can detect is 250 copies / mL. A negative result does not preclude SARS-CoV-2 infection and should not be used as the sole basis for treatment or other patient management decisions.  A negative result may occur with improper specimen collection / handling, submission of specimen other than nasopharyngeal swab, presence of viral mutation(s) within the areas targeted by this assay, and inadequate number of viral copies (<250 copies / mL). A negative result must be combined with clinical observations, patient history, and epidemiological information.  Fact Sheet for Patients:   BoilerBrush.com.cyhttps://www.fda.gov/media/136312/download  Fact Sheet for Healthcare Providers: https://pope.com/https://www.fda.gov/media/136313/download  This test is not yet approved or  cleared by the [place] FDA and has been authorized for detection and/or diagnosis of SARS-CoV-2 by FDA under an Emergency Use Authorization (EUA).  This EUA will remain in effect (meaning this test can be used) for the duration of the COVID-19 declaration under Section 564(b)(1) of the Act, 21 U.S.C. section 360bbb-3(b)(1), unless the authorization is terminated or revoked sooner.  Performed at [facility], [address] [place], [place] [address]   MRSA PCR Screening     Status: None   Collection Time: 02/16/20  4:53 PM   Specimen: Nasopharyngeal  Result Value Ref Range Status   MRSA by PCR NEGATIVE NEGATIVE Final    Comment:  The GeneXpert MRSA Assay (FDA approved for NASAL specimens only), is one component of a comprehensive MRSA colonization surveillance program. It is not intended to diagnose MRSA infection nor to guide or monitor treatment for MRSA infections. Performed at [facility], [address] [place], [place] [address]          Radiology Studies: DG Chest [place] 1 View  Result Date: 02/17/2020 CLINICAL DATA:  Hypoxia, current  smoker EXAM: PORTABLE CHEST 1 VIEW COMPARISON:  Chest radiograph from one day prior. FINDINGS: Stable cardiomediastinal silhouette with mild cardiomegaly. No pneumothorax. Small stable right pleural effusion. No left pleural effusion. Diffuse prominence of the parahilar interstitial markings, stable. Patchy opacities at both lung bases, stable. IMPRESSION: 1. Stable mild cardiomegaly with diffuse prominence of the parahilar interstitial markings, suggesting pulmonary edema. 2. Stable small right pleural effusion. 3. Stable patchy bibasilar lung opacities, favor atelectasis, cannot exclude a component of aspiration or pneumonia. Electronically Signed   By: Delbert Phenix M.D.   On: 02/17/2020 15:36   DG Chest Port 1 View  Result Date: 02/16/2020 CLINICAL DATA:  Cough and respiratory distress EXAM: PORTABLE CHEST 1 VIEW COMPARISON:  February 13, 2020 FINDINGS: There is consolidation in the right base region with right pleural effusion. There is cardiomegaly with pulmonary venous hypertension. There is diffuse interstitial edema. No adenopathy. There is aortic atherosclerosis. IMPRESSION: Cardiomegaly with pulmonary vascular congestion. Diffuse interstitial edema with right pleural effusion. Suspect congestive heart failure. Superimposed airspace opacity in the right lower lung region may represent superimposed pneumonia. There may also be alveolar edema in this area. Both edema and pneumonia may present concurrently. Aortic Atherosclerosis (ICD10-I70.0). Electronically Signed   By: Bretta Bang III M.D.   On: 02/16/2020 10:36        Scheduled Meds: . aspirin EC  81 mg Oral Daily  . buPROPion  150 mg Oral Daily  . calcium-vitamin D  1 tablet Oral Q lunch  . clopidogrel  75 mg Oral Daily  . feeding supplement (ENSURE ENLIVE)  237 mL Oral BID BM  . furosemide  40 mg Intravenous Daily  . insulin aspart  0-15 Units Subcutaneous TID WC  . insulin aspart  5 Units Subcutaneous TID WC  . insulin glargine   28 Units Subcutaneous QHS  . ipratropium-albuterol  3 mL Nebulization Q6H  . metoprolol tartrate  12.5 mg Oral BID  . multivitamin with minerals  1 tablet Oral Daily  . omega-3 acid ethyl esters  1 g Oral Daily  . pantoprazole (PROTONIX) IV  40 mg Intravenous Q12H  . pravastatin  40 mg Oral q1800  . predniSONE  40 mg Oral Q breakfast  . sodium chloride flush  3 mL Intravenous Q12H   Continuous Infusions: . sodium chloride    . ceFEPime (MAXIPIME) IV 2 g (02/17/20 1210)  . heparin 900 Units/hr (02/16/20 1447)     Time spent: 45 minutes with over 50% of the time coordinating the patient's care    Jae Dire, DO [facility] Pager [phone]  Call night coverage person covering after 7pm

## 2020-02-17 NOTE — [place] FL2 (Signed)
[place] MEDICAID [id number] LEVEL OF CARE SCREENING TOOL     IDENTIFICATION  Patient Name: Hannah Conway Birthdate: 09/12/1929 Sex: female Admission Date (Current Location): 02/13/2020  [place] and [place] Number:  Chiropodist and Address:  [facility], [address], [place], [place] [address]      Provider Number: [id number]  Attending Physician Name and Address:  Jae Dire, MD  Relative Name and Phone Number:  Ginnie Smart  [phone]    Current Level of Care: SNF Recommended Level of Care: Skilled Nursing Facility Prior Approval Number:    Date Approved/Denied:   PASRR Number:    Discharge Plan: SNF    Current Diagnoses: Patient Active Problem List   Diagnosis Date Noted  . Acute hypoxemic respiratory failure (HCC) 02/16/2020  . COPD with acute exacerbation (HCC) 02/16/2020  . NSTEMI (non-ST elevated myocardial infarction) (HCC) 02/13/2020  . Vitamin D deficiency 12/26/2018  . Atherosclerosis of native arteries of extremity with rest pain (HCC) 08/24/2018  . Hematoma of groin 08/09/2018  . Atherosclerosis of native arteries of extremity with intermittent claudication (HCC) 07/25/2018  . Claudication of calf muscles (HCC) 02/03/2018  . [place] retinal degeneration, bilateral 09/10/2017  . Age-related nuclear cataract of both eyes 09/10/2017  . Chronic diarrhea 12/23/2016  . Bilateral nonexudative age-related macular degeneration 12/23/2016  . Aortic atherosclerosis (HCC) 06/18/2016  . High risk medication use 06/18/2014  . Edema 01/17/2014  . GERD (gastroesophageal reflux disease) 12/14/2013  . Hyperlipidemia associated with type 2 diabetes mellitus (HCC) 12/14/2013  . Hypertension associated with type 2 diabetes mellitus (HCC) 12/14/2013  . Type 2 diabetes mellitus with stage 3 chronic kidney disease, with long-term current use of insulin (HCC) 12/14/2013  . Aortic stenosis 12/14/2013    Orientation RESPIRATION BLADDER  Height & Weight     Situation, Self, Place    Incontinent Weight: 71.1 kg Height:  5\' 2"  (157.5 cm)  BEHAVIORAL SYMPTOMS/MOOD NEUROLOGICAL BOWEL NUTRITION STATUS      Continent    AMBULATORY STATUS COMMUNICATION OF NEEDS Skin   Extensive Assist Verbally                         Personal Care Assistance Level of Assistance  Bathing, Dressing Bathing Assistance: Limited assistance   Dressing Assistance: Maximum assistance     Functional Limitations Info             SPECIAL CARE FACTORS FREQUENCY  PT (By licensed PT), OT (By licensed OT)     PT Frequency: 5x a day OT Frequency: 5x a day            Contractures Contractures Info: Present    Additional Factors Info  Code Status, Allergies Code Status Info: Full Allergies Info: Atorvastatin and Simvastatin           Current Medications (02/17/2020):  This is the current hospital active medication list Current Facility-Administered Medications  Medication Dose Route Frequency Provider Last Rate Last Admin  . 0.9 %  sodium chloride infusion  250 mL Intravenous PRN Mansy, Jan A, MD      . acetaminophen (TYLENOL) tablet 650 mg  650 mg Oral Q4H PRN Mansy, Jan A, MD   650 mg at 02/15/20 0436  . albuterol (PROVENTIL) (2.5 MG/3ML) 0.083% nebulizer solution 2.5 mg  2.5 mg Nebulization Q2H PRN 02/17/20, MD   2.5 mg at 02/17/20 0515  . ALPRAZolam (XANAX) tablet 0.25 mg  0.25 mg Oral  BID PRN Mansy, Jan A, MD   0.25 mg at 02/17/20 1204  . aspirin EC tablet 81 mg  81 mg Oral Daily Mansy, Jan A, MD   81 mg at 02/17/20 0945  . buPROPion (WELLBUTRIN XL) 24 hr tablet 150 mg  150 mg Oral Daily Mansy, Jan A, MD   150 mg at 02/17/20 1128  . calcium-vitamin D (OSCAL WITH D) 500-200 MG-UNIT per tablet 1 tablet  1 tablet Oral Q lunch Mansy, Jan A, MD   1 tablet at 02/17/20 1204  . ceFEPIme (MAXIPIME) 2 g in sodium chloride 0.9 % 100 mL IVPB  2 g Intravenous Daily Jae Dire, MD 200 mL/hr at 02/17/20 1210 2 g at 02/17/20 1210   . clopidogrel (PLAVIX) tablet 75 mg  75 mg Oral Daily Mansy, Jan A, MD   75 mg at 02/17/20 0945  . feeding supplement (ENSURE ENLIVE) (ENSURE ENLIVE) liquid 237 mL  237 mL Oral BID BM Jae Dire, MD   237 mL at 02/17/20 1422  . furosemide (LASIX) injection 40 mg  40 mg Intravenous Daily Jae Dire, MD   40 mg at 02/17/20 0946  . heparin ADULT infusion 100 units/mL (25000 units/266mL sodium chloride 0.45%)  900 Units/hr Intravenous Continuous Katha Cabal, RPH 9 mL/hr at 02/16/20 1447 900 Units/hr at 02/16/20 1447  . insulin aspart (novoLOG) injection 0-15 Units  0-15 Units Subcutaneous TID WC Jae Dire, MD   8 Units at 02/17/20 1206  . insulin aspart (novoLOG) injection 5 Units  5 Units Subcutaneous TID WC Jae Dire, MD   5 Units at 02/17/20 1206  . insulin glargine (LANTUS) injection 28 Units  28 Units Subcutaneous QHS Jae Dire, MD   28 Units at 02/16/20 2111  . ipratropium-albuterol (DUONEB) 0.5-2.5 (3) MG/3ML nebulizer solution 3 mL  3 mL Nebulization Q6H Jae Dire, MD   3 mL at 02/17/20 1412  . metoprolol tartrate (LOPRESSOR) tablet 12.5 mg  12.5 mg Oral BID Jae Dire, MD   12.5 mg at 02/17/20 0945  . multivitamin with minerals tablet 1 tablet  1 tablet Oral Daily Mansy, Jan A, MD   1 tablet at 02/17/20 0945  . omega-3 acid ethyl esters (LOVAZA) capsule 1 g  1 g Oral Daily Mansy, Jan A, MD   1 g at 02/17/20 0945  . ondansetron (ZOFRAN) injection 4 mg  4 mg Intravenous Q6H PRN Mansy, Jan A, MD      . pantoprazole (PROTONIX) EC tablet 40 mg  40 mg Oral Daily Jae Dire, MD   40 mg at 02/17/20 0945  . pravastatin (PRAVACHOL) tablet 40 mg  40 mg Oral q1800 Mansy, Jan A, MD   40 mg at 02/16/20 1810  . predniSONE (DELTASONE) tablet 40 mg  40 mg Oral Q breakfast Jae Dire, MD   40 mg at 02/17/20 5027  . sodium chloride flush (NS) 0.9 % injection 3 mL  3 mL Intravenous Q12H Mansy, Jan A, MD   3 mL at 02/17/20 1258  . sodium chloride flush (NS) 0.9 %  injection 3 mL  3 mL Intravenous PRN Mansy, Jan A, MD      . zolpidem (AMBIEN) tablet 5 mg  5 mg Oral QHS PRN Mansy, Vernetta Honey, MD         Discharge Medications: Please see discharge summary for a list of discharge medications.  Relevant Imaging Results:  Relevant Lab Results:   Additional Information [id number]  Shawn Route, RN

## 2020-02-18 LAB — BASIC METABOLIC PANEL
Anion gap: 13 (ref 5–15)
BUN: 47 mg/dL — ABNORMAL HIGH (ref 8–23)
CO2: 26 mmol/L (ref 22–32)
Calcium: 9.3 mg/dL (ref 8.9–10.3)
Chloride: 92 mmol/L — ABNORMAL LOW (ref 98–111)
Creatinine, Ser: 1.46 mg/dL — ABNORMAL HIGH (ref 0.44–1.00)
GFR calc Af Amer: 37 mL/min — ABNORMAL LOW (ref >60)
GFR calc non Af Amer: 32 mL/min — ABNORMAL LOW (ref >60)
Glucose, Bld: 125 mg/dL — ABNORMAL HIGH (ref 70–99)
Potassium: 4.4 mmol/L (ref 3.5–5.1)
Sodium: 131 mmol/L — ABNORMAL LOW (ref 135–145)

## 2020-02-18 LAB — CBC
HCT: 26.8 % — ABNORMAL LOW (ref 36.0–46.0)
Hemoglobin: 8.7 g/dL — ABNORMAL LOW (ref 12.0–15.0)
MCH: 25.2 pg — ABNORMAL LOW (ref 26.0–34.0)
MCHC: 32.5 g/dL (ref 30.0–36.0)
MCV: 77.7 fL — ABNORMAL LOW (ref 80.0–100.0)
Platelets: 411 10*3/uL — ABNORMAL HIGH (ref 150–400)
RBC: 3.45 MIL/uL — ABNORMAL LOW (ref 3.87–5.11)
RDW: 16.5 % — ABNORMAL HIGH (ref 11.5–15.5)
WBC: 20.3 10*3/uL — ABNORMAL HIGH (ref 4.0–10.5)
nRBC: 0.3 % — ABNORMAL HIGH (ref 0.0–0.2)

## 2020-02-18 LAB — GLUCOSE, CAPILLARY
Glucose-Capillary: 128 mg/dL — ABNORMAL HIGH (ref 70–99)
Glucose-Capillary: 210 mg/dL — ABNORMAL HIGH (ref 70–99)
Glucose-Capillary: 323 mg/dL — ABNORMAL HIGH (ref 70–99)
Glucose-Capillary: 324 mg/dL — ABNORMAL HIGH (ref 70–99)

## 2020-02-18 LAB — URINALYSIS, COMPLETE (UACMP) WITH MICROSCOPIC
Bilirubin Urine: NEGATIVE
Glucose, UA: 150 mg/dL — AB
Ketones, ur: NEGATIVE mg/dL
Nitrite: NEGATIVE
Protein, ur: NEGATIVE mg/dL
Specific Gravity, Urine: 1.011 (ref 1.005–1.030)
pH: 6 (ref 5.0–8.0)

## 2020-02-18 LAB — HEPARIN LEVEL (UNFRACTIONATED): Heparin Unfractionated: 0.55 IU/mL (ref 0.30–0.70)

## 2020-02-18 MED ORDER — FUROSEMIDE 10 MG/ML IJ SOLN
20.0000 mg | Freq: Two times a day (BID) | INTRAMUSCULAR | Status: DC
  Administered 2020-02-18 – 2020-02-20 (×4): 20 mg via INTRAVENOUS
  Filled 2020-02-18 (×4): qty 2

## 2020-02-18 MED ORDER — INSULIN ASPART 100 UNIT/ML ~~LOC~~ SOLN
0.0000 [IU] | Freq: Three times a day (TID) | SUBCUTANEOUS | Status: DC
  Administered 2020-02-18: 15 [IU] via SUBCUTANEOUS
  Administered 2020-02-19: 7 [IU] via SUBCUTANEOUS
  Administered 2020-02-19: 4 [IU] via SUBCUTANEOUS
  Administered 2020-02-19 – 2020-02-20 (×2): 15 [IU] via SUBCUTANEOUS
  Administered 2020-02-20: 3 [IU] via SUBCUTANEOUS
  Administered 2020-02-21: 4 [IU] via SUBCUTANEOUS
  Filled 2020-02-18 (×6): qty 1

## 2020-02-18 MED ORDER — INSULIN GLARGINE 100 UNIT/ML ~~LOC~~ SOLN
30.0000 [IU] | Freq: Every day | SUBCUTANEOUS | Status: DC
  Administered 2020-02-18 – 2020-02-21 (×4): 30 [IU] via SUBCUTANEOUS
  Filled 2020-02-18 (×6): qty 0.3

## 2020-02-18 MED ORDER — BUDESONIDE 0.25 MG/2ML IN SUSP
0.2500 mg | Freq: Two times a day (BID) | RESPIRATORY_TRACT | Status: DC
  Administered 2020-02-18 – 2020-02-19 (×2): 0.25 mg via RESPIRATORY_TRACT
  Filled 2020-02-18 (×2): qty 2

## 2020-02-18 MED ORDER — DOXYCYCLINE HYCLATE 100 MG PO TABS
100.0000 mg | ORAL_TABLET | Freq: Two times a day (BID) | ORAL | Status: DC
  Administered 2020-02-18 – 2020-02-22 (×8): 100 mg via ORAL
  Filled 2020-02-18 (×8): qty 1

## 2020-02-18 NOTE — Progress Notes (Signed)
ANTICOAGULATION CONSULT NOTE  Pharmacy Consult for Heparin Indication: chest pain/ACS  Allergies  Allergen Reactions   Atorvastatin Other (See Comments)    "Felt as if she would pass out"   Simvastatin Other (See Comments)    "Felt as if she would pass out"     Patient Measurements: Height: 5\' 2"  (157.5 cm) Weight: 71.4 kg (157 lb 6.5 oz) IBW/kg (Calculated) : 50.1 HEPARIN DW (KG): 65.4  Vital Signs: Temp: 98 F (36.7 C) (08/22 0324) Temp Source: Oral (08/22 0324) BP: 128/58 (08/22 0324) Pulse Rate: 61 (08/22 0324)  Labs: Recent Labs    02/16/20 0517 02/17/20 0705 02/18/20 0612  HGB 8.5* 8.1*  --   HCT 26.2* 25.8*  --   PLT 393 403*  --   HEPARINUNFRC 0.39 0.55 0.55  CREATININE 1.30* 1.29*  --     Estimated Creatinine Clearance: 27.9 mL/min (A) (by C-G formula based on SCr of 1.29 mg/dL (H)).  Medical History: Past Medical History:  Diagnosis Date   Diabetes mellitus without complication (HCC)    Hypertension    Peripheral artery disease (HCC)     Medications:  Medications Prior to Admission  Medication Sig Dispense Refill Last Dose   acetaminophen (TYLENOL) 500 MG tablet Take 1,000 mg by mouth every 6 (six) hours as needed for moderate pain.    02/12/2020 at 2000   aspirin EC 81 MG tablet Take 81 mg by mouth daily.    02/12/2020 at 1000   benazepril-hydrochlorthiazide (LOTENSIN HCT) 20-12.5 MG tablet Take 1 tablet by mouth daily.   02/12/2020 at 1000   buPROPion (WELLBUTRIN XL) 150 MG 24 hr tablet Take 150 mg by mouth daily.   02/12/2020 at 1000   Calcium Citrate-Vitamin D (CALCIUM CITRATE + D3 PO) Take 2 tablets by mouth daily with lunch.   02/12/2020 at 1800   Cinnamon 500 MG TABS Take 500 mg by mouth daily.   02/12/2020 at 1000   clopidogrel (PLAVIX) 75 MG tablet Take 1 tablet by mouth once daily with breakfast (Patient taking differently: Take 75 mg by mouth daily. ) 90 tablet 0 02/12/2020 at 1000   Multiple Vitamin (MULTIVITAMIN WITH MINERALS)  TABS tablet Take 1 tablet by mouth daily. Complete Multivitamin   02/12/2020 at 1000   NOVOLOG MIX 70/30 FLEXPEN (70-30) 100 UNIT/ML FlexPen Inject 35-55 Units into the skin See admin instructions. Inject 55 units subcutaneously in the morning and 35 in the evening  1 02/12/2020 at 1800   Omega-3 Fatty Acids (FISH OIL) 1000 MG CAPS Take 1,000 mg by mouth 2 (two) times daily.   02/12/2020 at 2000   pravastatin (PRAVACHOL) 40 MG tablet Take 40 mg by mouth daily.   02/12/2020 at 2000   oxyCODONE (OXY IR/ROXICODONE) 5 MG immediate release tablet Take 1 tablet (5 mg total) by mouth every 6 (six) hours as needed for moderate pain or severe pain. (Patient not taking: Reported on 01/05/2019) 20 tablet 0     Assessment: Heparin initiated for ACS/STEMI.  No anticoagulants PTA per med list. Baseline CBC with Hgb of 8 which appears c/w patient's baseline. Baseline aPTT, PT-INR WNL.   8/18 0518 HL 0.34  8/18 1116 HL 0.32 8/19 0411 HL 0.39 8/20 0517 HL 0.39 8/21 0705 HL 0.55 8/22 0612 HL 0.55  Goal of Therapy:  Heparin level 0.3-0.7 units/ml Monitor platelets by anticoagulation protocol: Yes   Plan:  Heparin level is therapeutic.  Will continue heparin infusion at 900 units/hr. Recheck heparin level and CBC with  AM labs.    Wayland Denis, PharmD 02/18/2020,7:02 AM

## 2020-02-18 NOTE — Consult Note (Signed)
[address] [facility] [place], [place] [address] Phone [phone]. Fax [phone]  Date: 02/18/2020                  Patient Name:  Hannah Conway  MRN: [id number]  DOB: 04/10/1930  Age / Sex: 86 y.o., female         PCP: Mickey Farberhies, David, MD                 Service Requesting Consult: IM/ Jae DireSegal, Jared E, MD                 Reason for Consult: ARF            History of Present Illness: Patient is a 86 y.o. female with medical problems of diabetes, peripheral vascular disease, COPD, who [facility]2020 for evaluation of Acute pulmonary edema (HCC) [J81.0] NSTEMI (non-ST elevated myocardial infarction) (HCC) [I21.4] Atherosclerosis of artery of extremity with intermittent claudication (HCC) [I70.219] Acute respiratory failure with hypoxemia (HCC) [J96.01] New onset of congestive heart failure (HCC) [I50.9]  Nephrology consult has been requested for evaluation of AKI and hyponatremia  Patient's baseline creatinine is 0.79 from January 13, 2019 Admit creatinine of 1.19.  During hospitalization it has fluctuated between 1.2-1.5. Latest creatinine from today is 1.46  No renal imaging is available  Urinalysis from August 21 shows large hemoglobin, large leukocytes, many bacteria, 6-10 RBCs, 21-50 WBCs suggesting a UTI Currently getting doxycycline, milligrams every 12 hours  Patient has chronic hyponatremia with her usual sodium levels of 130-134  08/21 0701 - 08/22 0700 In: 408.8 [P.O.:240; I.V.:72.2; IV Piggyback:96.6] Out: 1000 [Urine:1000]   Lab Results  Component Value Date   CREATININE 1.46 (H) 02/18/2020   CREATININE 1.29 (H) 02/17/2020   CREATININE 1.30 (H) 02/16/2020      Medications: Outpatient medications: Medications Prior to Admission  Medication Sig Dispense Refill Last Dose  . acetaminophen (TYLENOL) 500 MG tablet Take 1,000 mg by mouth every 6 (six) hours as needed for moderate pain.    02/12/2020 at 2000  . aspirin EC 81 MG tablet  Take 81 mg by mouth daily.    02/12/2020 at 1000  . benazepril-hydrochlorthiazide (LOTENSIN HCT) 20-12.5 MG tablet Take 1 tablet by mouth daily.   02/12/2020 at 1000  . buPROPion (WELLBUTRIN XL) 150 MG 24 hr tablet Take 150 mg by mouth daily.   02/12/2020 at 1000  . Calcium Citrate-Vitamin D (CALCIUM CITRATE + D3 PO) Take 2 tablets by mouth daily with lunch.   02/12/2020 at 1800  . Cinnamon 500 MG TABS Take 500 mg by mouth daily.   02/12/2020 at 1000  . clopidogrel (PLAVIX) 75 MG tablet Take 1 tablet by mouth once daily with breakfast (Patient taking differently: Take 75 mg by mouth daily. ) 90 tablet 0 02/12/2020 at 1000  . Multiple Vitamin (MULTIVITAMIN WITH MINERALS) TABS tablet Take 1 tablet by mouth daily. Complete Multivitamin   02/12/2020 at 1000  . NOVOLOG MIX 70/30 FLEXPEN (70-30) 100 UNIT/ML FlexPen Inject 35-55 Units into the skin See admin instructions. Inject 55 units subcutaneously in the morning and 35 in the evening  1 02/12/2020 at 1800  . Omega-3 Fatty Acids (FISH OIL) 1000 MG CAPS Take 1,000 mg by mouth 2 (two) times daily.   02/12/2020 at 2000  . pravastatin (PRAVACHOL) 40 MG tablet Take 40 mg by mouth daily.   02/12/2020 at 2000  . oxyCODONE (OXY IR/ROXICODONE) 5 MG immediate release tablet Take 1 tablet (5  mg total) by mouth every 6 (six) hours as needed for moderate pain or severe pain. (Patient not taking: Reported on 01/05/2019) 20 tablet 0     Current medications: Current Facility-Administered Medications  Medication Dose Route Frequency Provider Last Rate Last Admin  . 0.9 %  sodium chloride infusion  250 mL Intravenous PRN Mansy, Jan A, MD      . acetaminophen (TYLENOL) tablet 650 mg  650 mg Oral Q4H PRN Mansy, Jan A, MD   650 mg at 02/15/20 0436  . albuterol (PROVENTIL) (2.5 MG/3ML) 0.083% nebulizer solution 2.5 mg  2.5 mg Nebulization Q2H PRN Jae Dire, MD   2.5 mg at 02/18/20 1127  . ALPRAZolam Prudy Feeler) tablet 0.25 mg  0.25 mg Oral BID PRN Mansy, Jan A, MD   0.25 mg at  02/18/20 0145  . aspirin EC tablet 81 mg  81 mg Oral Daily Mansy, Jan A, MD   81 mg at 02/18/20 0914  . budesonide (PULMICORT) nebulizer solution 0.25 mg  0.25 mg Nebulization BID Jae Dire, MD      . buPROPion (WELLBUTRIN XL) 24 hr tablet 150 mg  150 mg Oral Daily Mansy, Jan A, MD   150 mg at 02/18/20 0915  . calcium-vitamin D (OSCAL WITH D) 500-200 MG-UNIT per tablet 1 tablet  1 tablet Oral Q lunch Mansy, Jan A, MD   1 tablet at 02/17/20 1204  . ceFEPIme (MAXIPIME) 2 g in sodium chloride 0.9 % 100 mL IVPB  2 g Intravenous Daily Jae Dire, MD 200 mL/hr at 02/18/20 0922 2 g at 02/18/20 9937  . clopidogrel (PLAVIX) tablet 75 mg  75 mg Oral Daily Mansy, Jan A, MD   75 mg at 02/18/20 0914  . doxycycline (VIBRA-TABS) tablet 100 mg  100 mg Oral Q12H Jae Dire, MD      . feeding supplement (ENSURE ENLIVE) (ENSURE ENLIVE) liquid 237 mL  237 mL Oral BID BM Jae Dire, MD   237 mL at 02/17/20 1422  . furosemide (LASIX) injection 20 mg  20 mg Intravenous BID Dalia Heading, MD      . insulin aspart (novoLOG) injection 0-15 Units  0-15 Units Subcutaneous TID WC Jae Dire, MD   2 Units at 02/18/20 0913  . insulin aspart (novoLOG) injection 5 Units  5 Units Subcutaneous TID WC Jae Dire, MD   5 Units at 02/18/20 0912  . insulin glargine (LANTUS) injection 28 Units  28 Units Subcutaneous QHS Jae Dire, MD   28 Units at 02/17/20 2158  . ipratropium-albuterol (DUONEB) 0.5-2.5 (3) MG/3ML nebulizer solution 3 mL  3 mL Nebulization Q6H Jae Dire, MD   3 mL at 02/18/20 1696  . metoprolol tartrate (LOPRESSOR) tablet 12.5 mg  12.5 mg Oral BID Jae Dire, MD   12.5 mg at 02/18/20 0915  . multivitamin with minerals tablet 1 tablet  1 tablet Oral Daily Mansy, Jan A, MD   1 tablet at 02/18/20 0915  . omega-3 acid ethyl esters (LOVAZA) capsule 1 g  1 g Oral Daily Mansy, Jan A, MD   1 g at 02/18/20 0914  . ondansetron (ZOFRAN) injection 4 mg  4 mg Intravenous Q6H PRN Mansy, Jan A, MD       . pantoprazole (PROTONIX) injection 40 mg  40 mg Intravenous Q12H Melodie Bouillon B, MD   40 mg at 02/18/20 0913  . pravastatin (PRAVACHOL) tablet 40 mg  40 mg Oral q1800 Mansy, Jan  A, MD   40 mg at 02/17/20 1738  . predniSONE (DELTASONE) tablet 40 mg  40 mg Oral Q breakfast Jae Dire, MD   40 mg at 02/18/20 0915  . sodium chloride flush (NS) 0.9 % injection 3 mL  3 mL Intravenous Q12H Mansy, Jan A, MD   3 mL at 02/18/20 0916  . sodium chloride flush (NS) 0.9 % injection 3 mL  3 mL Intravenous PRN Mansy, Jan A, MD      . zolpidem (AMBIEN) tablet 5 mg  5 mg Oral QHS PRN Mansy, Vernetta Honey, MD          Allergies: Allergies  Allergen Reactions  . Atorvastatin Other (See Comments)    "Felt as if she would pass out"  . Simvastatin Other (See Comments)    "Felt as if she would pass out"       Past Medical History: Past Medical History:  Diagnosis Date  . Diabetes mellitus without complication (HCC)   . Hypertension   . Peripheral artery disease [facility])      Past Surgical History: Past Surgical History:  Procedure Laterality Date  . ABDOMINAL HYSTERECTOMY     86 y.o. at time  . CHOLECYSTECTOMY  1965  . JOINT REPLACEMENT  2016   Bilat. Hip Replacements   . LOWER EXTREMITY ANGIOGRAPHY Right 08/09/2018   Procedure: LOWER EXTREMITY ANGIOGRAPHY;  Surgeon: Renford Dills, MD;  Location: [facility] INVASIVE CV LAB;  Service: Cardiovascular;  Laterality: Right;  . LOWER EXTREMITY ANGIOGRAPHY Right 01/13/2019   Procedure: LOWER EXTREMITY ANGIOGRAPHY;  Surgeon: Renford Dills, MD;  Location: [facility] INVASIVE CV LAB;  Service: Cardiovascular;  Laterality: Right;     Family History: Family History  Problem Relation Age of Onset  . Congestive Heart Failure Mother   . Diabetes Father   . Diabetes Sister      Social History: Social History   Socioeconomic History  . Marital status: Married    Spouse name: Burman Riis   . Number of children: 5  . Years of education: Not on file   . 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  Tobacco Use  . Smoking status: Current Every Day Smoker    Packs/day: 1.00    Years: 30.00    Pack years: 30.00    Types: Cigarettes  . Smokeless tobacco: Never Used  Vaping Use  . Vaping Use: Never used  Substance and Sexual Activity  . Alcohol use: Never  . Drug use: Not Currently  . Sexual activity: Not on file  Other Topics Concern  . Not on file  Social History Narrative  . Not on file   Social Determinants of Health   Financial Resource Strain:   . Difficulty of Paying Living Expenses: Not on file  Food Insecurity:   . Worried About Programme researcher, broadcasting/film/video in the Last Year: Not on file  . Ran Out of Food in the Last Year: Not on file  Transportation Needs:   . Lack of Transportation (Medical): Not on file  . Lack of Transportation (Non-Medical): Not on file  Physical Activity:   . Days of Exercise per Week: Not on file  . Minutes of Exercise per Session: Not on file  Stress:   . Feeling of Stress : Not on file  Social Connections:   . Frequency of Communication with Friends and Family: Not on file  .  Frequency of Social Gatherings with Friends and Family: Not on file  . Attends Religious Services: Not on file  . Active Member of Clubs or Organizations: Not on file  . Attends Banker Meetings: Not on file  . Marital Status: Not on file  Intimate Partner Violence:   . Fear of Current or Ex-Partner: Not on file  . Emotionally Abused: Not on file  . Physically Abused: Not on file  . Sexually Abused: Not on file     Review of Systems: Gen: No fever or major weight changes HEENT: No hearing or vision complaints CV: No chest pain Resp: Some cough and chronic shortness of breath.  States that her shortness of breath was building for few weeks but got worse prior to admission.  She quit smoking couple weeks ago.  She  used to smoke at least a pack a day chronically. GI: Appetite is fair, no nausea or vomiting GU : No complaints MS: No complaints Derm:    No complaints Psych: No complaints Heme: No complaints Neuro: No complaints Endocrine.  No complaints  Vital Signs: Blood pressure 130/75, pulse (!) 57, temperature 97.6 F (36.4 C), temperature source Oral, resp. rate 19, height 5\' 2"  (1.575 m), weight 71.4 kg, SpO2 99 %.   Intake/Output Summary (Last 24 hours) at 02/18/2020 1228 Last data filed at 02/18/2020 0900 Gross per 24 hour  Intake 408.79 ml  Output 1000 ml  Net -591.21 ml    Weight trends: 02/20/2020   02/16/20 0342 02/17/20 0315 02/18/20 0324  Weight: 70.2 kg 71.1 kg 71.4 kg    Physical Exam: General:  No acute distress, lying in the bed  HEENT  anicteric, moist oral mucous membranes  Lungs:  Coarse breath sounds, mild expiratory wheezing, [place] O2  Heart::  Soft systolic murmur, regular, no rub  Abdomen:  Soft, nontender  Extremities:  Trace edema  Neurologic:  Alert, oriented  Skin:  Warm, dry   Lab results: Basic Metabolic Panel: Recent Labs  Lab 02/15/20 0411 02/15/20 0411 02/16/20 0517 02/17/20 0705 02/18/20 0612  NA 129*   < > 126* 130* 131*  K 4.3   < > 4.0 3.4* 4.4  CL 91*   < > 87* 89* 92*  CO2 27   < > 28 28 26   GLUCOSE 175*   < > 160* 139* 125*  BUN 48*   < > 47* 43* 47*  CREATININE 1.52*   < > 1.30* 1.29* 1.46*  CALCIUM 9.3   < > 8.9 9.1 9.3  MG 1.9  --  1.9 2.2  --    < > = values in this interval not displayed.    Liver Function Tests: Recent Labs  Lab 02/13/20 0137  AST 48*  ALT 21  ALKPHOS 91  BILITOT 0.5  PROT 7.8  ALBUMIN 3.4*   No results for input(s): LIPASE, AMYLASE in the last 168 hours. No results for input(s): AMMONIA in the last 168 hours.  CBC: Recent Labs  Lab 02/15/20 0411 02/15/20 0411 02/16/20 0517 02/16/20 0517 02/17/20 0705 02/18/20 0612  WBC 15.7*   < > 16.2*   < > 16.9* 20.3*  NEUTROABS 9.4*  --  11.1*   --   --   --   HGB 8.5*   < > 8.5*   < > 8.1* 8.7*  HCT 28.1*   < > 26.2*   < > 25.8* 26.8*  MCV 81.2   < > 76.8*   < >  79.6* 77.7*  PLT 409*   < > 393   < > 403* 411*   < > = values in this interval not displayed.    Cardiac Enzymes: No results for input(s): CKTOTAL, TROPONINI in the last 168 hours.  BNP: Invalid input(s): POCBNP  CBG: Recent Labs  Lab 02/17/20 1156 02/17/20 1714 02/17/20 2053 02/18/20 0754 02/18/20 1149  GLUCAP 268* 203* 222* 128* 210*    Microbiology: Recent Results (from the past 720 hour(s))  SARS CORONAVIRUS 2 (TAT 6-24 HRS) Nasopharyngeal Nasopharyngeal Swab     Status: None   Collection Time: 02/02/20  1:06 PM   Specimen: Nasopharyngeal Swab  Result Value Ref Range Status   SARS Coronavirus 2 NEGATIVE NEGATIVE Final    Comment: (NOTE) SARS-CoV-2 target nucleic acids are NOT DETECTED.  The SARS-CoV-2 RNA is generally detectable in upper and lower respiratory specimens during the acute phase of infection. Negative results do not preclude SARS-CoV-2 infection, do not rule out co-infections with other pathogens, and should not be used as the sole basis for treatment or other patient management decisions. Negative results must be combined with clinical observations, patient history, and epidemiological information. The expected result is Negative.  Fact Sheet for Patients: HairSlick.no  Fact Sheet for Healthcare Providers: quierodirigir.com  This test is not yet approved or cleared by the [place] FDA and  has been authorized for detection and/or diagnosis of SARS-CoV-2 by FDA under an Emergency Use Authorization (EUA). This EUA will remain  in effect (meaning this test can be used) for the duration of the COVID-19 declaration under Se ction 564(b)(1) of the Act, 21 U.S.C. section 360bbb-3(b)(1), unless the authorization is terminated or revoked sooner.  Performed at [facility], [address], [place], [place] [address]   SARS CORONAVIRUS 2 (TAT 6-24 HRS) Nasopharyngeal Nasopharyngeal Swab     Status: None   Collection Time: 02/09/20  3:43 PM   Specimen: Nasopharyngeal Swab  Result Value Ref Range Status   SARS Coronavirus 2 NEGATIVE NEGATIVE Final    Comment: (NOTE) SARS-CoV-2 target nucleic acids are NOT DETECTED.  The SARS-CoV-2 RNA is generally detectable in upper and lower respiratory specimens during the acute phase of infection. Negative results do not preclude SARS-CoV-2 infection, do not rule out co-infections with other pathogens, and should not be used as the sole basis for treatment or other patient management decisions. Negative results must be combined with clinical observations, patient history, and epidemiological information. The expected result is Negative.  Fact Sheet for Patients: HairSlick.no  Fact Sheet for Healthcare Providers: quierodirigir.com  This test is not yet approved or cleared by the [place] FDA and  has been authorized for detection and/or diagnosis of SARS-CoV-2 by FDA under an Emergency Use Authorization (EUA). This EUA will remain  in effect (meaning this test can be used) for the duration of the COVID-19 declaration under Se ction 564(b)(1) of the Act, 21 U.S.C. section 360bbb-3(b)(1), unless the authorization is terminated or revoked sooner.  Performed at [facility], [address] [place], [place] [address]   SARS Coronavirus 2 by RT PCR (hospital order, performed in [facility] hospital lab) Nasopharyngeal Nasopharyngeal Swab     Status: None   Collection Time: 02/13/20  1:37 AM   Specimen: Nasopharyngeal Swab  Result Value Ref Range Status   SARS Coronavirus 2 NEGATIVE NEGATIVE Final    Comment: (NOTE) SARS-CoV-2 target nucleic acids are NOT DETECTED.  The SARS-CoV-2 RNA is generally detectable in  upper and lower respiratory  specimens during the acute phase of infection. The lowest concentration of SARS-CoV-2 viral copies this assay can detect is 250 copies / mL. A negative result does not preclude SARS-CoV-2 infection and should not be used as the sole basis for treatment or other patient management decisions.  A negative result may occur with improper specimen collection / handling, submission of specimen other than nasopharyngeal swab, presence of viral mutation(s) within the areas targeted by this assay, and inadequate number of viral copies (<250 copies / mL). A negative result must be combined with clinical observations, patient history, and epidemiological information.  Fact Sheet for Patients:   BoilerBrush.com.cy  Fact Sheet for Healthcare Providers: https://pope.com/  This test is not yet approved or  cleared by the [place] FDA and has been authorized for detection and/or diagnosis of SARS-CoV-2 by FDA under an Emergency Use Authorization (EUA).  This EUA will remain in effect (meaning this test can be used) for the duration of the COVID-19 declaration under Section 564(b)(1) of the Act, 21 U.S.C. section 360bbb-3(b)(1), unless the authorization is terminated or revoked sooner.  Performed at [facility], [address] [place], [place] [address]   MRSA PCR Screening     Status: None   Collection Time: 02/16/20  4:53 PM   Specimen: Nasopharyngeal  Result Value Ref Range Status   MRSA by PCR NEGATIVE NEGATIVE Final    Comment:        The GeneXpert MRSA Assay (FDA approved for NASAL specimens only), is one component of a comprehensive MRSA colonization surveillance program. It is not intended to diagnose MRSA infection nor to guide or monitor treatment for MRSA infections. Performed at [facility], [address] [place], [place] [address]      Coagulation Studies: No results for input(s): LABPROT, INR in  the last 72 hours.  Urinalysis: Recent Labs    02/17/20 1749  COLORURINE YELLOW*  LABSPEC 1.011  PHURINE 6.0  GLUCOSEU 150*  HGBUR LARGE*  BILIRUBINUR NEGATIVE  KETONESUR NEGATIVE  PROTEINUR NEGATIVE  NITRITE NEGATIVE  LEUKOCYTESUR LARGE*        Imaging: DG Chest Port 1 View  Result Date: 02/17/2020 CLINICAL DATA:  Hypoxia, current smoker EXAM: PORTABLE CHEST 1 VIEW COMPARISON:  Chest radiograph from one day prior. FINDINGS: Stable cardiomediastinal silhouette with mild cardiomegaly. No pneumothorax. Small stable right pleural effusion. No left pleural effusion. Diffuse prominence of the parahilar interstitial markings, stable. Patchy opacities at both lung bases, stable. IMPRESSION: 1. Stable mild cardiomegaly with diffuse prominence of the parahilar interstitial markings, suggesting pulmonary edema. 2. Stable small right pleural effusion. 3. Stable patchy bibasilar lung opacities, favor atelectasis, cannot exclude a component of aspiration or pneumonia. Electronically Signed   By: Delbert Phenix M.D.   On: 02/17/2020 15:36      Assessment & Plan: Pt is a 86 y.o.   female with peripheral vascular disease, diabetes, COPD was admitted on 02/13/2020 with Acute pulmonary edema (HCC) [J81.0] NSTEMI (non-ST elevated myocardial infarction) (HCC) [I21.4] Atherosclerosis of artery of extremity with intermittent claudication (HCC) [I70.219] Acute respiratory failure with hypoxemia (HCC) [J96.01] New onset of congestive heart failure (HCC) [I50.9]   # Hyponatremia Patient has chronic hyponatremia which may be due to SIADH related to her underlying lung disease.  Her usual sodium levels are 130-134.  Today sodium level is 131.  Patient also states she likes to drink a lot of water. Recommend: Reasonable fluid restriction to about 1200 cc/day  #  AKI Likely due to volume shifts during hospitalization with Lasix Determination of volume status in this patient is difficult due to her  underlying lung condition. May use diuretic as needed during hospitalization for now  #Shortness of breath, COPD exacerbation Currently on cefepime IV, doxycycline for dilators, prednisone Treatment as per hospitalist       LOS: 5 Nahum Sherrer 8/22/202112:28 PM    Note: This note was prepared with Dragon dictation. Any transcription errors are unintentional

## 2020-02-18 NOTE — Progress Notes (Addendum)
PROGRESS NOTE    Hannah Conway    Code Status: Full Code  FGH:[id number] DOB: August 28, 1929 DOA: 02/13/2020 LOS: 5 days  PCP: Mickey Farber, MD CC:  Chief Complaint  Patient presents with  . Respiratory Distress       Hospital Summary   This is an 86 year old female with a past medical history of diabetes, hypertension, PVD who presented to the ED on 8/17 with acutely worsening dyspnea and respiratory distress x4 days which had worsened overnight prior to admission.  Also had dry cough, nausea without vomiting.  Denied any chest pain or palpitations  Upon presentation to the emergency room, blood pressure was 149/53 and pulse 70 was 90% on CPAP with otherwise normal vital signs.  Labs revealed VBG with pH 7.42 and bicarbonate of 25.9.  CMP was unremarkable.  BNP was 1085.6.  High-sensitivity troponin I was 2180 and later 1848 with procalcitonin less than 0.1.  CBC showed leukocytosis of 14.5 with anemia with hemoglobin of 8 hematocrit 26.1.  Chest x-ray showed diffuse interstitial opacity worse in the lower lobes likely indicating pulmonary edema., EKG showed sinus rhythm with rate of 85 with PACs, incomplete right bundle branch block and prolonged QT interval with QTC of 503 MS.  The patient was given aspirin as well as 20 mg of IV Lasix, IV heparin bolus and drip.    She was admitted to a progressive bed and cardiology was consulted.  An echo was obtained and showed EF 45 to 50% moderate LVH, grade 2 diastolic dysfunction and severe hypokinesis of left ventricle and entire anterolateral wall and inferior lateral wall as well as moderate MR and severe AS and moderate aortic regurgitation.  8/18: Hb 6.7 -> 7.4 on repeat.  Given 1 unit PRBC transfusion in setting of NSTEMI 8/20: COPD exacerbation with wheezing and increased work of breathing. CXR concerning for both pulmonary edema now developing consolidation. Started on Cefepime, steroids and bronchodilators 8/21: Respiratory distress/volume  overloaded. Increased Lasix 8/22: Somewhat improved respiratory status but increased Cr. Decreased lasix per cardio. Nephrology on board for hyponatremia and aki. Added on pulmicort and doxycycline.    A & P   Principal Problem:   Acute hypoxemic respiratory failure (HCC) Active Problems:   Hypertension associated with type 2 diabetes mellitus (HCC)   Type 2 diabetes mellitus with stage 3 chronic kidney disease, with long-term current use of insulin (HCC)   NSTEMI (non-ST elevated myocardial infarction) (HCC)   COPD with acute exacerbation (HCC)   1. NSTEMI, stable a. Echo with wall motion and valvular abnormalities as above b. Continue heparin drip, aspirin, plavix, beta blocker, pravastatin c. Continue telemetry d. Cardio: Continue medical management and consider cardiac cath prior to discharge but not a candidate currently given comorbidities  2. Acute on chronic iron deficiency anemia while on heparin, concern for chronic GI bleed s/p 1 u PRBC and IV Iron stable a. 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 GI consulted: Likely chronic.  Given her NSTEMI, heart failure and respiratory issues she is at high risk for anesthesia and endoscopic evaluation especially since she is not acutely bleeding.  She will need endoscopy in a few weeks post cardiac event  3. Acute hypoxic respiratory failure, Multifactorial: HFpEF exacerbation, COPD exacerbation  Concern for HAP a. Still poor respiratory status with increased work of breathing/conversational dyspnea but reports improved from yesterday b. Continue bronchodilators, steroids (Day 3/5), Cefepime (Day 3/5), add on Doxycycline and Pulmicort c.  Hold antitussives as the patient had altered mental status with these meds and has a prolonged QTc so Tessalon pearls are contraindicated d. Incentive spirometry  4. Diastolic heart failure exacerbation a. Echo: EF 45 to 50% moderate  LVH, grade 2 diastolic dysfunction and severe hypokinesis of left ventricle and entire anterolateral wall and inferior lateral wall as well as moderate MR and severe AS and moderate aortic regurgitation b. Discontinued ACE due to AKI c. Cardio on board: decrease lasix due to Cr bump d. Fluid restrict e. Continue heart failure protocol   5. AKI on CKD 3a, likely multifactorial: Cardiorenal, anemia, hemodynamic changes, ACE inhibitor/HCTZ  a. Holding benzapril b. Monitor with Lasix changes as above c. Nephrology consulted  6. Hyponatremia, unclear etiology possibly SIADH a. Nephrology on board b. Fluid restriction  7. Leukocytosis, likely steroid induced  8. Hypertension a. Continue current management  9. Hyperlipidemia, Stable  10. Type 2 diabetes a. Currently on steroids b. Added on Novolog 5 u TID with meals c. Resistant sliding scale  d. Increase Lantus   11. Depression a. Continue Wellbutrin XL  12. Asymptomatic UTI a. Continue Cefepime for above   DVT prophylaxis: Heparin   Family Communication: Patient's family at bedside has been updated   Disposition Plan:  Status is: Inpatient  Remains inpatient appropriate because:Unsafe d/c plan, IV treatments appropriate due to intensity of illness or inability to take PO and Inpatient level of care appropriate due to severity of illness   Dispo: The patient is from: Home              Anticipated d/c is to: TBD              Anticipated d/c date is: > 3 days              Patient currently is not medically stable to d/c.          Pressure injury documentation    None  Consultants  Cardiology  GI Nephrology  Procedures  None  Antibiotics   Anti-infectives (From admission, onward)   Start     Dose/Rate Route Frequency Ordered Stop   02/18/20 1000  doxycycline (VIBRA-TABS) tablet 100 mg        100 mg Oral Every 12 hours 02/18/20 0936 02/23/20 0959   02/16/20 1500  ceFEPIme (MAXIPIME) 2 g in sodium  chloride 0.9 % 100 mL IVPB        2 g 200 mL/hr over 30 Minutes Intravenous Daily 02/16/20 1428          Subjective   Reportedly feeling a bit better today. Still with some shortness of breath but not like yesterday. Reports that the neb treatments help. No chest pain or other issues. No BM this morning. No other complaints or overnight events.   Objective   Vitals:   02/18/20 0753 02/18/20 0812 02/18/20 1051 02/18/20 1127  BP: 113/89  130/75   Pulse: 62  (!) 57   Resp: 18  19   Temp: 97.9 F (36.6 C)  97.6 F (36.4 C)   TempSrc: Oral  Oral   SpO2: 100% 99% 100% 99%  Weight:      Height:        Intake/Output Summary (Last 24 hours) at 02/18/2020 1556 Last data filed at 02/18/2020 1300 Gross per 24 hour  Intake 648.79 ml  Output 1000 ml  Net -351.21 ml   Filed Weights   02/16/20 0342 02/17/20 0315 02/18/20 0324  Weight: 70.2 kg 71.1  kg 71.4 kg    Examination:  Physical Exam Vitals and nursing note reviewed. Exam conducted with a chaperone present.  Constitutional:      Comments: Conversational dyspnea  Eyes:     Conjunctiva/sclera: Conjunctivae normal.  Cardiovascular:     Rate and Rhythm: Normal rate and regular rhythm.  Pulmonary:     Breath sounds: Wheezing present. No rales.     Comments: Conversational dyspnea Pursed lips Abdominal:     General: Abdomen is flat. There is no distension.  Musculoskeletal:        General: No swelling or tenderness.  Neurological:     Mental Status: She is alert. Mental status is at baseline.  Psychiatric:        Mood and Affect: Mood normal.        Behavior: Behavior normal.     Data Reviewed: I have personally reviewed following labs and imaging studies  CBC: Recent Labs  Lab 02/13/20 0137 02/13/20 0137 02/14/20 0518 02/14/20 0518 02/14/20 0707 02/14/20 0707 02/14/20 2033 02/15/20 0411 02/16/20 0517 02/17/20 0705 02/18/20 0612  WBC 14.5*   < > 19.1*   < > 20.1*  --   --  15.7* 16.2* 16.9* 20.3*    NEUTROABS 8.9*  --  11.4*  --   --   --   --  9.4* 11.1*  --   --   HGB 8.0*   < > 6.7*   < > 7.4*   < > 7.9* 8.5* 8.5* 8.1* 8.7*  HCT 26.1*   < > 22.3*   < > 23.5*   < > 25.5* 28.1* 26.2* 25.8* 26.8*  MCV 79.6*   < > 80.2   < > 77.3*  --   --  81.2 76.8* 79.6* 77.7*  PLT 469*   < > 417*   < > 420*  --   --  409* 393 403* 411*   < > = values in this interval not displayed.   Basic Metabolic Panel: Recent Labs  Lab 02/13/20 0137 02/13/20 0137 02/13/20 0656 02/14/20 0707 02/15/20 0411 02/16/20 0517 02/17/20 0705 02/18/20 0612  NA 130*   < >  --  130* 129* 126* 130* 131*  K 4.2   < >  --  4.3 4.3 4.0 3.4* 4.4  CL 91*   < >  --  92* 91* 87* 89* 92*  CO2 27   < >  --  27 27 28 28 26   GLUCOSE 173*   < >  --  52* 175* 160* 139* 125*  BUN 24*   < >  --  45* 48* 47* 43* 47*  CREATININE 1.19*   < >  --  1.51* 1.52* 1.30* 1.29* 1.46*  CALCIUM 9.9   < >  --  9.5 9.3 8.9 9.1 9.3  MG 2.8*  --  2.1  --  1.9 1.9 2.2  --    < > = values in this interval not displayed.   GFR: Estimated Creatinine Clearance: 24.6 mL/min (A) (by C-G formula based on SCr of 1.46 mg/dL (H)). Liver Function Tests: Recent Labs  Lab 02/13/20 0137  AST 48*  ALT 21  ALKPHOS 91  BILITOT 0.5  PROT 7.8  ALBUMIN 3.4*   No results for input(s): LIPASE, AMYLASE in the last 168 hours. No results for input(s): AMMONIA in the last 168 hours. Coagulation Profile: Recent Labs  Lab 02/13/20 0137  INR 1.0   Cardiac Enzymes: No results for input(s): CKTOTAL, CKMB,  CKMBINDEX, TROPONINI in the last 168 hours. BNP (last 3 results) No results for input(s): PROBNP in the last 8760 hours. HbA1C: Recent Labs    02/16/20 0517  HGBA1C 6.5*   CBG: Recent Labs  Lab 02/17/20 1156 02/17/20 1714 02/17/20 2053 02/18/20 0754 02/18/20 1149  GLUCAP 268* 203* 222* 128* 210*   Lipid Profile: No results for input(s): CHOL, HDL, LDLCALC, TRIG, CHOLHDL, LDLDIRECT in the last 72 hours. Thyroid Function Tests: No results  for input(s): TSH, T4TOTAL, FREET4, T3FREE, THYROIDAB in the last 72 hours. Anemia Panel: No results for input(s): VITAMINB12, FOLATE, FERRITIN, TIBC, IRON, RETICCTPCT in the last 72 hours. Sepsis Labs: Recent Labs  Lab 02/13/20 0137  PROCALCITON <0.10    Recent Results (from the past 240 hour(s))  SARS CORONAVIRUS 2 (TAT 6-24 HRS) Nasopharyngeal Nasopharyngeal Swab     Status: None   Collection Time: 02/09/20  3:43 PM   Specimen: Nasopharyngeal Swab  Result Value Ref Range Status   SARS Coronavirus 2 NEGATIVE NEGATIVE Final    Comment: (NOTE) SARS-CoV-2 target nucleic acids are NOT DETECTED.  The SARS-CoV-2 RNA is generally detectable in upper and lower respiratory specimens during the acute phase of infection. Negative results do not preclude SARS-CoV-2 infection, do not rule out co-infections with other pathogens, and should not be used as the sole basis for treatment or other patient management decisions. Negative results must be combined with clinical observations, patient history, and epidemiological information. The expected result is Negative.  Fact Sheet for Patients: HairSlick.no  Fact Sheet for Healthcare Providers: quierodirigir.com  This test is not yet approved or cleared by the [place] FDA and  has been authorized for detection and/or diagnosis of SARS-CoV-2 by FDA under an Emergency Use Authorization (EUA). This EUA will remain  in effect (meaning this test can be used) for the duration of the COVID-19 declaration under Se ction 564(b)(1) of the Act, 21 U.S.C. section 360bbb-3(b)(1), unless the authorization is terminated or revoked sooner.  Performed at [facility], [address] [place], [place] [address]   SARS Coronavirus 2 by RT PCR (hospital order, performed in [facility] hospital lab) Nasopharyngeal Nasopharyngeal Swab     Status: None   Collection Time: 02/13/20  1:37 AM    Specimen: Nasopharyngeal Swab  Result Value Ref Range Status   SARS Coronavirus 2 NEGATIVE NEGATIVE Final    Comment: (NOTE) SARS-CoV-2 target nucleic acids are NOT DETECTED.  The SARS-CoV-2 RNA is generally detectable in upper and lower respiratory specimens during the acute phase of infection. The lowest concentration of SARS-CoV-2 viral copies this assay can detect is 250 copies / mL. A negative result does not preclude SARS-CoV-2 infection and should not be used as the sole basis for treatment or other patient management decisions.  A negative result may occur with improper specimen collection / handling, submission of specimen other than nasopharyngeal swab, presence of viral mutation(s) within the areas targeted by this assay, and inadequate number of viral copies (<250 copies / mL). A negative result must be combined with clinical observations, patient history, and epidemiological information.  Fact Sheet for Patients:   BoilerBrush.com.cy  Fact Sheet for Healthcare Providers: https://pope.com/  This test is not yet approved or  cleared by the [place] FDA and has been authorized for detection and/or diagnosis of SARS-CoV-2 by FDA under an Emergency Use Authorization (EUA).  This EUA will remain in effect (meaning this test can be used) for the duration of the COVID-19 declaration under  Section 564(b)(1) of the Act, 21 U.S.C. section 360bbb-3(b)(1), unless the authorization is terminated or revoked sooner.  Performed at [facility], [address] [place], [place] [address]   MRSA PCR Screening     Status: None   Collection Time: 02/16/20  [facility]4:53 PM   Specimen: Nasopharyngeal  Result Value Ref Range Status   MRSA by PCR NEGATIVE NEGATIVE Final    Comment:        The GeneXpert MRSA Assay (FDA approved for NASAL specimens only), is one component of a comprehensive MRSA colonization surveillance program. It  is not intended to diagnose MRSA infection nor to guide or monitor treatment for MRSA infections. Performed at [facility], [address] [place], [place] [address]          Radiology Studies: DG Chest [place] View  Result Date: 02/17/2020 CLINICAL DATA:  Hypoxia, current smoker EXAM: PORTABLE CHEST 1 VIEW COMPARISON:  Chest radiograph from one day prior. FINDINGS: Stable cardiomediastinal silhouette with mild cardiomegaly. No pneumothorax. Small stable right pleural effusion. No left pleural effusion. Diffuse prominence of the parahilar interstitial markings, stable. Patchy opacities at both lung bases, stable. IMPRESSION: 1. Stable mild cardiomegaly with diffuse prominence of the parahilar interstitial markings, suggesting pulmonary edema. 2. Stable small right pleural effusion. 3. Stable patchy bibasilar lung opacities, favor atelectasis, cannot exclude a component of aspiration or pneumonia. Electronically Signed   By: Delbert PhenixJason A Poff M.D.   On: 02/17/2020 15:36        Scheduled Meds: . aspirin EC  81 mg Oral Daily  . budesonide (PULMICORT) nebulizer solution  0.25 mg Nebulization BID  . buPROPion  150 mg Oral Daily  . calcium-vitamin D  1 tablet Oral Q lunch  . clopidogrel  75 mg Oral Daily  . doxycycline  100 mg Oral Q12H  . feeding supplement (ENSURE ENLIVE)  237 mL Oral BID BM  . furosemide  20 mg Intravenous BID  . insulin aspart  0-15 Units Subcutaneous TID WC  . insulin aspart  5 Units Subcutaneous TID WC  . insulin glargine  28 Units Subcutaneous QHS  . ipratropium-albuterol  3 mL Nebulization Q6H  . metoprolol tartrate  12.5 mg Oral BID  . multivitamin with minerals  1 tablet Oral Daily  . omega-3 acid ethyl esters  1 g Oral Daily  . pantoprazole (PROTONIX) IV  40 mg Intravenous Q12H  . pravastatin  40 mg Oral q1800  . predniSONE  40 mg Oral Q breakfast  . sodium chloride flush  3 mL Intravenous Q12H   Continuous Infusions: . sodium chloride    .  ceFEPime (MAXIPIME) IV 2 g (02/18/20 [id number])     Time spent: 40 minutes with over 50% of the time coordinating the patient's care    Jae DireJared E Mykeria Garman, DO [facility] Pager [phone]  Call night coverage person covering after 7pm

## 2020-02-18 NOTE — Progress Notes (Signed)
Patient Name: Hannah Conway Date of Encounter: 02/18/2020  Hospital Problem List     Principal Problem:   Acute hypoxemic respiratory failure (HCC) Active Problems:   Hypertension associated with type 2 diabetes mellitus (HCC)   Type 2 diabetes mellitus with stage 3 chronic kidney disease, with long-term current use of insulin (HCC)   NSTEMI (non-ST elevated myocardial infarction) (HCC)   COPD with acute exacerbation [facility](HCC)    Patient Profile     86 year old female with history of peripheral vascular disease, diabetes who was in preop in preparation for an angiogram of her lower extremities by vascular surgery. She became acutely short of breath which had started in retrospect 4 days earlier but worsened on presentation to the hospital. She was sent to the emergency room where she complained of a dry cough. EKG showed nondiagnostic changes for ischemia. She had elevated troponins to 2180 which have subsequently diminished. She was felt to have a non-ST elevation myocardial infarction. Echo revealed ejection fraction 45 to 50% with hypokinesis of the anterolateral wall. She had severe aortic stenosis with a mean gradient of 40 mmHg with a V-max of 3.98 m/s. Her hospital course was complicated by anemia requiring transfusion as well as increasing respiratory distress. Chest x-ray showed diffuse interstitial opacity in the lower lobes.  Subjective   Still short of breath but somewhat improved today.  Inpatient Medications    . aspirin EC  81 mg Oral Daily  . budesonide (PULMICORT) nebulizer solution  0.25 mg Nebulization BID  . buPROPion  150 mg Oral Daily  . calcium-vitamin D  1 tablet Oral Q lunch  . clopidogrel  75 mg Oral Daily  . doxycycline  100 mg Oral Q12H  . feeding supplement (ENSURE ENLIVE)  237 mL Oral BID BM  . furosemide  20 mg Intravenous BID  . insulin aspart  0-15 Units Subcutaneous TID WC  . insulin aspart  5 Units Subcutaneous TID WC  . insulin glargine   28 Units Subcutaneous QHS  . ipratropium-albuterol  3 mL Nebulization Q6H  . metoprolol tartrate  12.5 mg Oral BID  . multivitamin with minerals  1 tablet Oral Daily  . omega-3 acid ethyl esters  1 g Oral Daily  . pantoprazole (PROTONIX) IV  40 mg Intravenous Q12H  . pravastatin  40 mg Oral q1800  . predniSONE  40 mg Oral Q breakfast  . sodium chloride flush  3 mL Intravenous Q12H    Vital Signs    Vitals:   02/18/20 0000 02/18/20 0324 02/18/20 0753 02/18/20 0812  BP:  (!) 128/58 113/89   Pulse:  61 62   Resp: 16 20 18    Temp:  98 F (36.7 C) 97.9 F (36.6 C)   TempSrc:  Oral Oral   SpO2:  99% 100% 99%  Weight:  71.4 kg    Height:        Intake/Output Summary (Last 24 hours) at 02/18/2020 1032 Last data filed at 02/18/2020 0900 Gross per 24 hour  Intake 408.79 ml  Output 1000 ml  Net -591.21 ml   Filed Weights   02/16/20 0342 02/17/20 0315 02/18/20 0324  Weight: 70.2 kg 71.1 kg 71.4 kg    Physical Exam    GEN: Well nourished, well developed, in no acute distress.  HEENT: normal.  Neck: Supple, no JVD, carotid bruits, or masses. Cardiac: RRR, no murmurs, rubs, or gallops. No clubbing, cyanosis, edema.  Radials/DP/PT 2+ and equal bilaterally.  Respiratory:  Respirations regular and  unlabored, clear to auscultation bilaterally. GI: Soft, nontender, nondistended, BS + x 4. MS: no deformity or atrophy. Skin: warm and dry, no rash. Neuro:  Strength and sensation are intact. Psych: Normal affect.  Labs    CBC Recent Labs    02/16/20 0517 02/16/20 0517 02/17/20 0705 02/18/20 0612  WBC 16.2*   < > 16.9* 20.3*  NEUTROABS 11.1*  --   --   --   HGB 8.5*   < > 8.1* 8.7*  HCT 26.2*   < > 25.8* 26.8*  MCV 76.8*   < > 79.6* 77.7*  PLT 393   < > 403* 411*   < > = values in this interval not displayed.   Basic Metabolic Panel Recent Labs    96/04/54 0517 02/16/20 0517 02/17/20 0705 02/18/20 0612  NA 126*   < > 130* 131*  K 4.0   < > 3.4* 4.4  CL 87*   < > 89*  92*  CO2 28   < > 28 26  GLUCOSE 160*   < > 139* 125*  BUN 47*   < > 43* 47*  CREATININE 1.30*   < > 1.29* 1.46*  CALCIUM 8.9   < > 9.1 9.3  MG 1.9  --  2.2  --    < > = values in this interval not displayed.   Liver Function Tests No results for input(s): AST, ALT, ALKPHOS, BILITOT, PROT, ALBUMIN in the last 72 hours. No results for input(s): LIPASE, AMYLASE in the last 72 hours. Cardiac Enzymes No results for input(s): CKTOTAL, CKMB, CKMBINDEX, TROPONINI in the last 72 hours. BNP No results for input(s): BNP in the last 72 hours. D-Dimer No results for input(s): DDIMER in the last 72 hours. Hemoglobin A1C Recent Labs    02/16/20 0517  HGBA1C 6.5*   Fasting Lipid Panel No results for input(s): CHOL, HDL, LDLCALC, TRIG, CHOLHDL, LDLDIRECT in the last 72 hours. Thyroid Function Tests No results for input(s): TSH, T4TOTAL, T3FREE, THYROIDAB in the last 72 hours.  Invalid input(s): FREET3  Telemetry    Normal sinus rhythm  ECG    No  Radiology    DG Chest Port 1 View  Result Date: 02/17/2020 CLINICAL DATA:  Hypoxia, current smoker EXAM: PORTABLE CHEST 1 VIEW COMPARISON:  Chest radiograph from one day prior. FINDINGS: Stable cardiomediastinal silhouette with mild cardiomegaly. No pneumothorax. Small stable right pleural effusion. No left pleural effusion. Diffuse prominence of the parahilar interstitial markings, stable. Patchy opacities at both lung bases, stable. IMPRESSION: 1. Stable mild cardiomegaly with diffuse prominence of the parahilar interstitial markings, suggesting pulmonary edema. 2. Stable small right pleural effusion. 3. Stable patchy bibasilar lung opacities, favor atelectasis, cannot exclude a component of aspiration or pneumonia. Electronically Signed   By: Delbert Phenix M.D.   On: 02/17/2020 15:36   DG Chest Port 1 View  Result Date: 02/16/2020 CLINICAL DATA:  Cough and respiratory distress EXAM: PORTABLE CHEST 1 VIEW COMPARISON:  February 13, 2020  FINDINGS: There is consolidation in the right base region with right pleural effusion. There is cardiomegaly with pulmonary venous hypertension. There is diffuse interstitial edema. No adenopathy. There is aortic atherosclerosis. IMPRESSION: Cardiomegaly with pulmonary vascular congestion. Diffuse interstitial edema with right pleural effusion. Suspect congestive heart failure. Superimposed airspace opacity in the right lower lung region may represent superimposed pneumonia. There may also be alveolar edema in this area. Both edema and pneumonia may present concurrently. Aortic Atherosclerosis (ICD10-I70.0). Electronically Signed   By: Chrissie Noa  Margarita Grizzle III M.D.   On: 02/16/2020 10:36   DG Chest Portable 1 View  Result Date: 02/13/2020 CLINICAL DATA:  Dyspnea EXAM: PORTABLE CHEST 1 VIEW COMPARISON:  04/28/2013 FINDINGS: Diffuse interstitial opacity, worst in the lower lobes. No pneumothorax or sizable pleural effusion. IMPRESSION: Diffuse interstitial opacity, worst in the lower lobes, likely indicating pulmonary edema. Electronically Signed   By: Deatra Robinson M.D.   On: 02/13/2020 02:09   VAS [place] ABI WITH/WO TBI  Result Date: 02/01/2020 LOWER EXTREMITY DOPPLER STUDY Indications: Peripheral artery disease, and 08/09/2018              Bilat CIA PTA stents              Left EIA PTA stent              Right SFA to Pop PTA stent.  Vascular Interventions: 01/13/2019: PTA and Stent to the Right SFA and Popliteal                         Artery. Mechanical Thrombectomy of Occluded Rt SFA                         stents. Comparison Study: 01/30/2019 Performing Technologist: Debbe Bales RVS  Examination Guidelines: A complete evaluation includes at minimum, Doppler waveform signals and systolic blood pressure reading at the level of bilateral brachial, anterior tibial, and posterior tibial arteries, when vessel segments are accessible. Bilateral testing is considered an integral part of a complete examination.  Photoelectric Plethysmograph (PPG) waveforms and toe systolic pressure readings are included as required and additional duplex testing as needed. Limited examinations for reoccurring indications may be performed as noted.  ABI Findings: +---------+------------------+-----+--------+--------+ Right    Rt Pressure (mmHg)IndexWaveformComment  +---------+------------------+-----+--------+--------+ Brachial 132                                     +---------+------------------+-----+--------+--------+ ATA      143               1.04 biphasic         +---------+------------------+-----+--------+--------+ PTA      130               0.94 biphasic         +---------+------------------+-----+--------+--------+ Great Toe110               0.80 Normal           +---------+------------------+-----+--------+--------+ +---------+------------------+-----+--------+-------+ Left     Lt Pressure (mmHg)IndexWaveformComment +---------+------------------+-----+--------+-------+ Brachial 138                                    +---------+------------------+-----+--------+-------+ ATA      99                0.72 biphasic        +---------+------------------+-----+--------+-------+ PERO     103               0.75 biphasic        +---------+------------------+-----+--------+-------+ Great Toe71                0.51 Abnormal        +---------+------------------+-----+--------+-------+ +-------+-----------+-----------+------------+------------+ ABI/TBIToday's ABIToday's TBIPrevious ABIPrevious TBI +-------+-----------+-----------+------------+------------+ Right  1.04       .80  1.04        .55          +-------+-----------+-----------+------------+------------+ Left   .75        .51        .73         .83          +-------+-----------+-----------+------------+------------+ Right ABIs appear essentially unchanged compared to prior study on 01/30/2019. Left ABIs  appear essentially unchanged compared to prior study on 01/30/2019. Rt TBIs appear to be increased compared to prior study on 01/30/2019. Lt TBIs appear to be decreased compared to prior study on 01/30/2019.  Summary: Right: Resting right ankle-brachial index is within normal range. No evidence of significant right lower extremity arterial disease. The right toe-brachial index is normal. Left: Resting left ankle-brachial index indicates moderate left lower extremity arterial disease. The left toe-brachial index is abnormal.  *See table(s) above for measurements and observations.  Electronically signed by Levora Dredge MD on 02/01/2020 at 11:46:14 AM.    Final    ECHOCARDIOGRAM COMPLETE  Result Date: 02/13/2020    ECHOCARDIOGRAM REPORT   Patient Name:   Hannah Conway Date of Exam: 02/13/2020 Medical Rec #:  [id number]     Height:       62.0 in Accession #:    [id number]    Weight:       149.0 lb Date of Birth:  June 25, 1930     BSA:          1.687 m Patient Age:    86 years      BP:           149/53 mmHg Patient Gender: F             HR:           81 bpm. Exam Location:  [facility] Procedure: Color Doppler, Cardiac Doppler, 2D Echo and Intracardiac            Opacification Agent Indications:     NSTEMI 121.4  History:         Patient has no prior history of Echocardiogram examinations.                  Risk Factors:Hypertension and Diabetes. PAD.  Sonographer:     Cristela Blue RDCS (AE) Referring Phys:  [id number] Vernetta Honey MANSY Diagnosing Phys: Cristal Deer End MD  Sonographer Comments: No subcostal window, suboptimal apical window and suboptimal parasternal window. Image acquisition challenging due to COPD. IMPRESSIONS  1. Left ventricular ejection fraction, by estimation, is 45 to 50%. The left ventricle has mildly decreased function. The left ventricle demonstrates regional wall motion abnormalities (see scoring diagram/findings for description). There is moderate left ventricular hypertrophy. Left ventricular diastolic  parameters are consistent with Grade II diastolic dysfunction (pseudonormalization). Elevated left atrial pressure. There is severe hypokinesis of the left ventricular, entire anterolateral wall and inferolateral wall.  2. Right ventricular systolic function is normal. The right ventricular size is normal. Tricuspid regurgitation signal is inadequate for assessing PA pressure.  3. Left atrial size was mildly dilated.  4. The mitral valve is grossly normal. Moderate mitral valve regurgitation. No evidence of mitral stenosis.  5. Unable to determine valve morphology due to image quality. Aortic valve regurgitation is moderate. Severe aortic valve stenosis. Aortic valve mean gradient measures 40.0 mmHg. Aortic valve Vmax measures 3.98 m/s.  6. Pulmonic valve regurgitation not well assessed. FINDINGS  Left Ventricle: Left ventricular ejection fraction, by estimation, is 45 to 50%. The left ventricle  has mildly decreased function. The left ventricle demonstrates regional wall motion abnormalities. Severe hypokinesis of the left ventricular, entire anterolateral wall and inferolateral wall. Definity contrast agent was given IV to delineate the left ventricular endocardial borders. The left ventricular internal cavity size was normal in size. There is moderate left ventricular hypertrophy. Left ventricular diastolic parameters are consistent with Grade II diastolic dysfunction (pseudonormalization). Elevated left atrial pressure. Right Ventricle: The right ventricular size is normal. Right vetricular wall thickness was not assessed. Right ventricular systolic function is normal. Tricuspid regurgitation signal is inadequate for assessing PA pressure. Left Atrium: Left atrial size was mildly dilated. Right Atrium: Right atrial size was normal in size. Pericardium: The pericardium was not well visualized. Mitral Valve: The mitral valve is grossly normal. Moderate mitral annular calcification. Moderate mitral valve  regurgitation. No evidence of mitral valve stenosis. Tricuspid Valve: The tricuspid valve is not well visualized. Tricuspid valve regurgitation is trivial. Aortic Valve: Unable to determine valve morphology due to image quality.. There is severe thickening and mild calcification of the aortic valve. Aortic valve regurgitation is moderate. Severe aortic stenosis is present. There is severe thickening of the aortic valve. There is mild calcification of the aortic valve. Aortic valve mean gradient measures 40.0 mmHg. Aortic valve peak gradient measures 63.4 mmHg. Aortic valve area, by VTI measures 0.65 cm. Pulmonic Valve: The pulmonic valve was not well visualized. Pulmonic valve regurgitation not well assessed. Aorta: The aortic root is normal in size and structure. Pulmonary Artery: The pulmonary artery is not well seen. Venous: The inferior vena cava was not well visualized. IAS/Shunts: The interatrial septum was not well visualized.  LEFT VENTRICLE PLAX 2D LVIDd:         3.50 cm      Diastology LVIDs:         2.47 cm      LV e' lateral:   3.92 cm/s LV PW:         1.22 cm      LV E/e' lateral: 37.0 LV IVS:        1.05 cm      LV e' medial:    4.57 cm/s LVOT diam:     2.00 cm      LV E/e' medial:  31.7 LV SV:         57 LV SV Index:   34 LVOT Area:     3.14 cm  LV Volumes (MOD) LV vol d, MOD A2C: 96.8 ml LV vol d, MOD A4C: 110.0 ml LV vol s, MOD A2C: 48.3 ml LV vol s, MOD A4C: 57.4 ml LV SV MOD A2C:     48.5 ml LV SV MOD A4C:     110.0 ml LV SV MOD BP:      50.5 ml RIGHT VENTRICLE RV Basal diam:  2.67 cm RV S prime:     13.60 cm/s TAPSE (M-mode): 2.5 cm LEFT ATRIUM             Index       RIGHT ATRIUM           Index LA diam:        4.40 cm 2.61 cm/m  RA Area:     13.20 cm LA Vol (A2C):   82.9 ml 49.14 ml/m RA Volume:   30.10 ml  17.84 ml/m LA Vol (A4C):   49.6 ml 29.40 ml/m LA Biplane Vol: 64.4 ml 38.17 ml/m  AORTIC VALVE  PULMONIC VALVE AV Area (Vmax):    0.69 cm     RVOT Peak grad: 6  mmHg AV Area (Vmean):   0.60 cm AV Area (VTI):     0.65 cm AV Vmax:           398.00 cm/s AV Vmean:          283.333 cm/s AV VTI:            0.889 m AV Peak Grad:      63.4 mmHg AV Mean Grad:      40.0 mmHg LVOT Vmax:         87.50 cm/s LVOT Vmean:        53.800 cm/s LVOT VTI:          0.183 m LVOT/AV VTI ratio: 0.21  AORTA Ao Root diam: 2.80 cm MITRAL VALVE MV Area (PHT): 3.30 cm     SHUNTS MV Decel Time: 230 msec     Systemic VTI:  0.18 m MV E velocity: 145.00 cm/s  Systemic Diam: 2.00 cm MV A velocity: 119.00 cm/s MV E/A ratio:  1.22 Cristal Deer End MD Electronically signed by Yvonne Kendall MD Signature Date/Time: 02/13/2020/10:47:14 AM    Final    VAS [place] LOWER EXTREMITY ARTERIAL DUPLEX  Result Date: 02/01/2020 LOWER EXTREMITY ARTERIAL DUPLEX STUDY Indications: Peripheral artery disease, and 08/09/2018              Bilat CIA PTA stents              Left EIA PTA stent              Right SFA to Pop PTA stent.  Vascular Interventions: 01/13/2019: PTA and Stent to the Right SFA and Popliteal                         Artery. Mechanical Thrombectomy of Occluded Rt SFA                         stents. Current ABI:            Rt 1.04, Lt .75 Comparison Study: 01/30/2019 Performing Technologist: Debbe Bales RVS  Examination Guidelines: A complete evaluation includes B-mode imaging, spectral Doppler, color Doppler, and power Doppler as needed of all accessible portions of each vessel. Bilateral testing is considered an integral part of a complete examination. Limited examinations for reoccurring indications may be performed as noted.  +-----------+--------+-----+--------+--------+--------+ RIGHT      PSV cm/sRatioStenosisWaveformComments +-----------+--------+-----+--------+--------+--------+ CFA Distal 178                  biphasic         +-----------+--------+-----+--------+--------+--------+ DFA        213                  biphasic          +-----------+--------+-----+--------+--------+--------+ SFA Prox   303                  biphasicStent    +-----------+--------+-----+--------+--------+--------+ SFA Mid    95                   biphasicstent    +-----------+--------+-----+--------+--------+--------+ SFA Distal 69                   biphasic         +-----------+--------+-----+--------+--------+--------+ POP Distal 102  biphasic         +-----------+--------+-----+--------+--------+--------+ ATA Distal 85                   biphasic         +-----------+--------+-----+--------+--------+--------+ PTA Distal 14                   biphasic         +-----------+--------+-----+--------+--------+--------+ PERO Distal41                   biphasic         +-----------+--------+-----+--------+--------+--------+  +-----------+--------+-----+--------+--------+--------+ LEFT       PSV cm/sRatioStenosisWaveformComments +-----------+--------+-----+--------+--------+--------+ CFA Distal 135                  biphasic         +-----------+--------+-----+--------+--------+--------+ DFA        87                   biphasic         +-----------+--------+-----+--------+--------+--------+ SFA Prox   89                   biphasic         +-----------+--------+-----+--------+--------+--------+ SFA Mid    195                  biphasic         +-----------+--------+-----+--------+--------+--------+ SFA Distal 46                   biphasic         +-----------+--------+-----+--------+--------+--------+ POP Distal 70                   biphasic         +-----------+--------+-----+--------+--------+--------+ ATA Distal 49                   biphasic         +-----------+--------+-----+--------+--------+--------+ PTA Distal 0                    Absent           +-----------+--------+-----+--------+--------+--------+ PERO Distal26                    biphasic         +-----------+--------+-----+--------+--------+--------+   Summary: Right: Imaging and Waveforms obtained throughout in the Right Lower Extremity. Biphasic Waveforms seen predominantly in the Right Lower Extremity. Left: Imaging and Waveforms obtained throughout in the Left Lower Extremity. Biphasic Waveforms seen predominantly in the Left Lower Extremity.  See table(s) above for measurements and observations. Electronically signed by Levora Dredge MD on 02/01/2020 at 11:46:17 AM.    Final     Assessment & Plan    1.  Non-ST elevation myocardial infarction-echo showed wall motion abnormalities with moderate reduced LV function. Not a candidate for invasive evaluation at present given comorbidities. Currently on a heparin drip aspirin and Plavix. Continues with beta-blocker. We will continue with this for now and when more stable consider cardiac cath to evaluate coronary anatomy as well as aortic valve.  Currently renal function is somewhat decreased from yesterday.  Will need to reduce diuresis.  Creatinine 1.46 up from 1.29.  Will reduce Lasix to 20 mg IV and follow renal function.  2.  Iron deficiency anemia-had one dark stool however no active bleeding while on anticoagulation and antiplatelet therapy.  Hemoglobin 8.1 this morning.. We will continue  with heparin for now however this may need to be stopped if anemia persists. She is at high risk for endoscopic evaluation at present. No active bleeding at present is noted.  Will remain on aspirin and Plavix.  Will discontinue heparin for now.  3.  Respiratory failure-on oxygen supplementation. Has underlying COPD as well as most likely diastolic failure secondary to her aortic valve disease.  4.  Heart failure-EF mildly reduced with anterolateral hypokinesis severe AS and moderate MR. We will continue with diuretics although we will reduce her dose.Melven Sartorius Ruqaya Strauss MD 02/18/2020, 10:32 AM  Pager: [phone]

## 2020-02-18 NOTE — Plan of Care (Signed)
  Problem: Activity: Goal: Ability to tolerate increased activity will improve Outcome: Not Progressing Remains on high flow nasal cannula.  Saturations low 90s.  Complains of dyspnea on exertion.

## 2020-02-19 DIAGNOSIS — I214 Non-ST elevation (NSTEMI) myocardial infarction: Principal | ICD-10-CM

## 2020-02-19 DIAGNOSIS — J9601 Acute respiratory failure with hypoxia: Secondary | ICD-10-CM

## 2020-02-19 DIAGNOSIS — J441 Chronic obstructive pulmonary disease with (acute) exacerbation: Secondary | ICD-10-CM

## 2020-02-19 LAB — BASIC METABOLIC PANEL
Anion gap: 13 (ref 5–15)
BUN: 60 mg/dL — ABNORMAL HIGH (ref 8–23)
CO2: 26 mmol/L (ref 22–32)
Calcium: 9.4 mg/dL (ref 8.9–10.3)
Chloride: 92 mmol/L — ABNORMAL LOW (ref 98–111)
Creatinine, Ser: 1.6 mg/dL — ABNORMAL HIGH (ref 0.44–1.00)
GFR calc Af Amer: 33 mL/min — ABNORMAL LOW (ref >60)
GFR calc non Af Amer: 28 mL/min — ABNORMAL LOW (ref >60)
Glucose, Bld: 202 mg/dL — ABNORMAL HIGH (ref 70–99)
Potassium: 4.6 mmol/L (ref 3.5–5.1)
Sodium: 131 mmol/L — ABNORMAL LOW (ref 135–145)

## 2020-02-19 LAB — GLUCOSE, CAPILLARY
Glucose-Capillary: 192 mg/dL — ABNORMAL HIGH (ref 70–99)
Glucose-Capillary: 225 mg/dL — ABNORMAL HIGH (ref 70–99)
Glucose-Capillary: 304 mg/dL — ABNORMAL HIGH (ref 70–99)
Glucose-Capillary: 304 mg/dL — ABNORMAL HIGH (ref 70–99)

## 2020-02-19 LAB — URINE CULTURE

## 2020-02-19 LAB — CBC
HCT: 28.6 % — ABNORMAL LOW (ref 36.0–46.0)
Hemoglobin: 8.9 g/dL — ABNORMAL LOW (ref 12.0–15.0)
MCH: 25.2 pg — ABNORMAL LOW (ref 26.0–34.0)
MCHC: 31.1 g/dL (ref 30.0–36.0)
MCV: 81 fL (ref 80.0–100.0)
Platelets: 381 10*3/uL (ref 150–400)
RBC: 3.53 MIL/uL — ABNORMAL LOW (ref 3.87–5.11)
RDW: 17.2 % — ABNORMAL HIGH (ref 11.5–15.5)
WBC: 19.2 10*3/uL — ABNORMAL HIGH (ref 4.0–10.5)
nRBC: 0.2 % (ref 0.0–0.2)

## 2020-02-19 MED ORDER — INSULIN ASPART 100 UNIT/ML ~~LOC~~ SOLN
7.0000 [IU] | Freq: Three times a day (TID) | SUBCUTANEOUS | Status: DC
  Administered 2020-02-19: 7 [IU] via SUBCUTANEOUS
  Filled 2020-02-19: qty 1

## 2020-02-19 MED ORDER — BUDESONIDE 0.5 MG/2ML IN SUSP
0.5000 mg | Freq: Two times a day (BID) | RESPIRATORY_TRACT | Status: DC
  Administered 2020-02-19 – 2020-02-22 (×6): 0.5 mg via RESPIRATORY_TRACT
  Filled 2020-02-19 (×6): qty 2

## 2020-02-19 MED ORDER — INSULIN ASPART 100 UNIT/ML ~~LOC~~ SOLN
10.0000 [IU] | Freq: Three times a day (TID) | SUBCUTANEOUS | Status: DC
  Administered 2020-02-19 – 2020-02-20 (×2): 10 [IU] via SUBCUTANEOUS
  Filled 2020-02-19 (×2): qty 1

## 2020-02-19 MED ORDER — IPRATROPIUM-ALBUTEROL 0.5-2.5 (3) MG/3ML IN SOLN
3.0000 mL | RESPIRATORY_TRACT | Status: DC
  Administered 2020-02-19 – 2020-02-20 (×4): 3 mL via RESPIRATORY_TRACT
  Filled 2020-02-19 (×4): qty 3

## 2020-02-19 NOTE — Progress Notes (Signed)
Physical Therapy Treatment Patient Details Name: Hannah Conway MRN: [id number] DOB: [birth year] Today's Date: 02/19/2020    History of Present Illness Hannah Conway is an 86 y/o female with complaints of worsening dyspnea x 4 days and was admitted for NSTEMI. PMH includes DM, HTN, and PAD.    PT Comments    Pt lying in bed with HOB elevated high, with daughter at bedside. Pt agreeable to PT session and wants to get out of bed to the chair this morning. Pt on 4L O2 via nasal cannula. Pt performed supine to sit transfer with supervision requiring no external physical assistance. PT required increased time to perform and once seated EOB experienced bout of non-productive coughing.  SpO2 at 96%. Pt performed sit <> stand transfer with mod A for boosting hips to stand, balance while transitioning hands to RW, ambulated 3 feet using RW with min A for steadying, and mod A for eccentric control on descent to chair.  Pt fatigued and experienced another bout of non-productive coughing; SpO2 94%. Pt positioned for comfort in recliner chair. Pt required verbal cues for sequencing of activities and placement of hands/feet throughout session. Pt continues to require increased amount of assistance for transfers with fatigue a limiting factor, only able to ambulate 3 feet before needing to sit/rest, therefore still recommending STR prior to discharge home to maximize functional mobility.   Follow Up Recommendations  SNF     Equipment Recommendations  Rolling walker with 5" wheels;3in1 (PT)    Recommendations for Other Services       Precautions / Restrictions Precautions Precautions: Fall Precaution Comments: high fall risk Restrictions Weight Bearing Restrictions: No    Mobility  Bed Mobility Overal bed mobility: Needs Assistance Bed Mobility: Supine to Sit     Supine to sit: Supervision     General bed mobility comments: supine to sit with supervision for safety with lines; verbal cues for  sequencing and pt required no physical assistance but did need increased time to perform from elevated HOB  Transfers Overall transfer level: Needs assistance Equipment used: Rolling walker (2 wheeled) Transfers: Sit to/from Stand Sit to Stand: Mod assist         General transfer comment: Mod A for sit <> stand transfer fot boost into standing and eccentric control on descent to chair. Verbal cues for hand and foot placement.  Ambulation/Gait Ambulation/Gait assistance: Min assist Gait Distance (Feet): 3 Feet Assistive device: Rolling walker (2 wheeled) Gait Pattern/deviations: Decreased step length - right;Decreased step length - left;Trunk flexed Gait velocity: decreased   General Gait Details: pt ambulated 3 feet using RW with min A for steadying; pt with decreased step lengths bilaterally and decreased foot clearance; verbal cues for keeping walker closer to body for safety   Stairs             Wheelchair Mobility    Modified Rankin (Stroke Patients Only)       Balance Overall balance assessment: Needs assistance Sitting-balance support: Feet supported;Bilateral upper extremity supported Sitting balance-[name]: Good Sitting balance - Comments: CGA A for steadying as pt began to have a coughing spell after supine to sitting EOB   Standing balance support: Bilateral upper extremity supported;During functional activity Standing balance-[name]: Fair Standing balance comment: BUE support on RW with min A for steadying                            Cognition Arousal/Alertness: Awake/alert Behavior  During Therapy: WFL for tasks assessed/performed Overall Cognitive Status: Within Functional Limits for tasks assessed                                        Exercises      General Comments        Pertinent Vitals/Pain Pain Assessment: No/denies pain    Home Living                      Prior Function             PT Goals (current goals can now be found in the care plan section) Acute Rehab PT Goals Patient Stated Goal: wants to go home with [employer] PT Goal Formulation: With patient/family Time For Goal Achievement: 03/01/20 Potential to Achieve Goals: Good Progress towards PT goals: Progressing toward goals    Frequency    Min 2X/week      PT Plan Current plan remains appropriate    Co-evaluation              AM-PAC PT "6 Clicks" Mobility   Outcome Measure  Help needed turning from your back to your side while in a flat bed without using bedrails?: A Little Help needed moving from lying on your back to sitting on the side of a flat bed without using bedrails?: A Lot Help needed moving to and from a bed to a chair (including a wheelchair)?: A Lot Help needed standing up from a chair using your arms (e.g., wheelchair or bedside chair)?: A Lot Help needed to walk in hospital room?: A Little Help needed climbing 3-5 steps with a railing? : A Lot 6 Click Score: 14    End of Session Equipment Utilized During Treatment: Gait belt Activity Tolerance: Patient limited by fatigue Patient left: in chair;with call bell/phone within reach;with chair alarm set;with family/visitor present Nurse Communication: Mobility status PT Visit Diagnosis: Unsteadiness on feet (R26.81);Muscle weakness (generalized) (M62.81);History of falling (Z91.81)     Time: 1607-3710 PT Time Calculation (min) (ACUTE ONLY): 25 min  Charges:                        Frederich Chick, SPT   Frederich Chick 02/19/2020, 11:08 AM

## 2020-02-19 NOTE — Progress Notes (Signed)
Subjective:  Nephrology consult has been requested for evaluation of AKI and hyponatremia Patient's baseline creatinine is 0.79 from January 13, 2019 Admit creatinine of 1.19.  Creatinine today is 1.6.   Objective: Vital signs in last 24 hours: Temp:  [97.8 F (36.6 C)-98.6 F (37 C)] 98.2 F (36.8 C) (08/23 0744) Pulse Rate:  [64-109] 109 (08/23 0744) Resp:  [18-20] 18 (08/23 0744) BP: (122-143)/(51-82) 122/82 (08/23 0744) SpO2:  [99 %-100 %] 100 % (08/23 0744) Weight:  [71.6 kg] 71.6 kg (08/23 0613) Weight change: 0.223 kg  Intake/Output from previous day: 08/22 0701 - 08/23 0700 In: 597.9 [P.O.:480; IV Piggyback:117.9] Out: 800 [Urine:800] Intake/Output this shift: Total I/O In: 480 [P.O.:480] Out: 500 [Urine:500]  Physical Exam: General:  No acute distress, lying in the bed  HEENT  Oral mucous membranes moist,[place] with O2 on flow  Lungs:  Mild expiratory wheezing  Heart::  S1S2, regular,systolic murmur+, no rub or gallop  Abdomen:  Soft, nontender  Extremities:  Peripheral edema +  Neurologic:  Alert, oriented  Skin:  No acute rashes or lesions,Warm, dry     Lab Results: Recent Labs    02/18/20 0612 02/19/20 0858  WBC 20.3* 19.2*  HGB 8.7* 8.9*  HCT 26.8* 28.6*  PLT 411* 381   BMET:  Recent Labs    02/18/20 0612 02/19/20 0744  NA 131* 131*  K 4.4 4.6  CL 92* 92*  CO2 26 26  GLUCOSE 125* 202*  BUN 47* 60*  CREATININE 1.46* 1.60*  CALCIUM 9.3 9.4   No results for input(s): PTH in the last 72 hours. Iron Studies: No results for input(s): IRON, TIBC, TRANSFERRIN, FERRITIN in the last 72 hours.  Studies/Results: No results found.    Assessment/Plan:  Hannah Conway is a 86 y.o.   female with peripheral vascular disease, diabetes, COPD was admitted on 02/13/2020 with Acute pulmonary edema (HCC) [J81.0] NSTEMI (non-ST elevated myocardial infarction) (HCC) [I21.4] Atherosclerosis of artery of extremity with intermittent claudication (HCC) [I70.219] Acute  respiratory failure with hypoxemia (HCC) [J96.01] New onset of congestive heart failure (HCC) [I50.9]   # Hyponatremia Patient has chronic hyponatremia which may be due to SIADH related to her underlying lung disease.  Her usual sodium levels are 130-134.  Today sodium level is 131.  She also admits drinking lot of water Advised  fluid restriction to about 1200 cc/day  # AKI Likely due to volume shifts during hospitalization with Lasix Determination of volume status in this patient is difficult due to her underlying lung condition. May use diuretic as needed during hospitalization for now  #Shortness of breath, COPD exacerbation Currently on cefepime IV, doxycycline, prednisone Treatment as per hospitalist  LOS: 6 days   Hannah Conway 02/19/2020,1:46 PM

## 2020-02-19 NOTE — Progress Notes (Signed)
Patient Name: Hannah Conway Date of Encounter: 02/19/2020  Hospital Problem List     Principal Problem:   Acute hypoxemic respiratory failure (HCC) Active Problems:   Hypertension associated with type 2 diabetes mellitus (HCC)   Type 2 diabetes mellitus with stage 3 chronic kidney disease, with long-term current use of insulin (HCC)   NSTEMI (non-ST elevated myocardial infarction) (HCC)   COPD with acute exacerbation [facility])    Patient Profile     86 year old female with history of peripheral vascular disease, diabetes who was in preop in preparation for an angiogram of her lower extremities by vascular surgery. She became acutely short of breath which had started in retrospect 4 days earlier but worsened on presentation to the hospital. She was sent to the emergency room where she complained of a dry cough. EKG showed nondiagnostic changes for ischemia. She had elevated troponins to 2180 which have subsequently diminished. She was felt to have a non-ST elevation myocardial infarction. Echo revealed ejection fraction 45 to 50% with hypokinesis of the anterolateral wall. She had severe aortic stenosis with a mean gradient of 40 mmHg with a V-max of 3.98 m/s. Her hospital course was complicated by anemia requiring transfusion as well as increasing respiratory distress. Chest x-ray showed diffuse interstitial opacity in the lower lobes.  Subjective   sob  Inpatient Medications    . aspirin EC  81 mg Oral Daily  . budesonide (PULMICORT) nebulizer solution  0.25 mg Nebulization BID  . buPROPion  150 mg Oral Daily  . calcium-vitamin D  1 tablet Oral Q lunch  . clopidogrel  75 mg Oral Daily  . doxycycline  100 mg Oral Q12H  . feeding supplement (ENSURE ENLIVE)  237 mL Oral BID BM  . furosemide  20 mg Intravenous BID  . insulin aspart  0-20 Units Subcutaneous TID WC  . insulin aspart  5 Units Subcutaneous TID WC  . insulin glargine  30 Units Subcutaneous QHS  .  ipratropium-albuterol  3 mL Nebulization Q6H  . metoprolol tartrate  12.5 mg Oral BID  . multivitamin with minerals  1 tablet Oral Daily  . omega-3 acid ethyl esters  1 g Oral Daily  . pantoprazole (PROTONIX) IV  40 mg Intravenous Q12H  . pravastatin  40 mg Oral q1800  . predniSONE  40 mg Oral Q breakfast  . sodium chloride flush  3 mL Intravenous Q12H    Vital Signs    Vitals:   02/18/20 1615 02/18/20 1954 02/18/20 2016 02/19/20 0613  BP: (!) 128/51 (!) 125/51  (!) 143/65  Pulse: 65 71  64  Resp: Temp: 97.8 F (36.6 C) 98.1 F (36.7 C)  98.6 F (37 C)  TempSrc: Oral     SpO2: 100% 100% 100% 100%  Weight:    71.6 kg  Height:        Intake/Output Summary (Last 24 hours) at 02/19/2020 0718 Last data filed at 02/19/2020 0243 Gross per 24 hour  Intake 597.94 ml  Output 800 ml  Net -202.06 ml   Filed Weights   02/17/20 0315 02/18/20 0324 02/19/20 0613  Weight: 71.1 kg 71.4 kg 71.6 kg    Physical Exam    GEN: Well nourished, well developed, in no acute distress.  HEENT: normal.  Neck: Supple, no JVD, carotid bruits, or masses. Cardiac: RRR, no murmurs, rubs, or gallops. No clubbing, cyanosis, edema.  Radials/DP/PT 2+ and equal bilaterally.  Respiratory:  Respirations regular and unlabored, clear  to auscultation bilaterally. GI: Soft, nontender, nondistended, BS + x 4. MS: no deformity or atrophy. Skin: warm and dry, no rash. Neuro:  Strength and sensation are intact. Psych: Normal affect.  Labs    CBC Recent Labs    02/17/20 0705 02/18/20 0612  WBC 16.9* 20.3*  HGB 8.1* 8.7*  HCT 25.8* 26.8*  MCV 79.6* 77.7*  PLT 403* 411*   Basic Metabolic Panel Recent Labs    13/24/40 0705 02/18/20 0612  NA 130* 131*  K 3.4* 4.4  CL 89* 92*  CO2 28 26  GLUCOSE 139* 125*  BUN 43* 47*  CREATININE 1.29* 1.46*  CALCIUM 9.1 9.3  MG 2.2  --    Liver Function Tests No results for input(s): AST, ALT, ALKPHOS, BILITOT, PROT, ALBUMIN in the last 72  hours. No results for input(s): LIPASE, AMYLASE in the last 72 hours. Cardiac Enzymes No results for input(s): CKTOTAL, CKMB, CKMBINDEX, TROPONINI in the last 72 hours. BNP No results for input(s): BNP in the last 72 hours. D-Dimer No results for input(s): DDIMER in the last 72 hours. Hemoglobin A1C No results for input(s): HGBA1C in the last 72 hours. Fasting Lipid Panel No results for input(s): CHOL, HDL, LDLCALC, TRIG, CHOLHDL, LDLDIRECT in the last 72 hours. Thyroid Function Tests No results for input(s): TSH, T4TOTAL, T3FREE, THYROIDAB in the last 72 hours.  Invalid input(s): FREET3  Telemetry    nsr  ECG    nsr  Radiology    DG Chest Port 1 View  Result Date: 02/17/2020 CLINICAL DATA:  Hypoxia, current smoker EXAM: PORTABLE CHEST 1 VIEW COMPARISON:  Chest radiograph from one day prior. FINDINGS: Stable cardiomediastinal silhouette with mild cardiomegaly. No pneumothorax. Small stable right pleural effusion. No left pleural effusion. Diffuse prominence of the parahilar interstitial markings, stable. Patchy opacities at both lung bases, stable. IMPRESSION: 1. Stable mild cardiomegaly with diffuse prominence of the parahilar interstitial markings, suggesting pulmonary edema. 2. Stable small right pleural effusion. 3. Stable patchy bibasilar lung opacities, favor atelectasis, cannot exclude a component of aspiration or pneumonia. Electronically Signed   By: Delbert Phenix M.D.   On: 02/17/2020 15:36   DG Chest Port 1 View  Result Date: 02/16/2020 CLINICAL DATA:  Cough and respiratory distress EXAM: PORTABLE CHEST 1 VIEW COMPARISON:  February 13, 2020 FINDINGS: There is consolidation in the right base region with right pleural effusion. There is cardiomegaly with pulmonary venous hypertension. There is diffuse interstitial edema. No adenopathy. There is aortic atherosclerosis. IMPRESSION: Cardiomegaly with pulmonary vascular congestion. Diffuse interstitial edema with right pleural  effusion. Suspect congestive heart failure. Superimposed airspace opacity in the right lower lung region may represent superimposed pneumonia. There may also be alveolar edema in this area. Both edema and pneumonia may present concurrently. Aortic Atherosclerosis (ICD10-I70.0). Electronically Signed   By: Bretta Bang III M.D.   On: 02/16/2020 10:36   DG Chest Portable 1 View  Result Date: 02/13/2020 CLINICAL DATA:  Dyspnea EXAM: PORTABLE CHEST 1 VIEW COMPARISON:  04/28/2013 FINDINGS: Diffuse interstitial opacity, worst in the lower lobes. No pneumothorax or sizable pleural effusion. IMPRESSION: Diffuse interstitial opacity, worst in the lower lobes, likely indicating pulmonary edema. Electronically Signed   By: Deatra Robinson M.D.   On: 02/13/2020 02:09   VAS [place] ABI WITH/WO TBI  Result Date: 02/01/2020 LOWER EXTREMITY DOPPLER STUDY Indications: Peripheral artery disease, and 08/09/2018              Bilat CIA PTA stents  Left EIA PTA stent              Right SFA to Pop PTA stent.  Vascular Interventions: 01/13/2019: PTA and Stent to the Right SFA and Popliteal                         Artery. Mechanical Thrombectomy of Occluded Rt SFA                         stents. Comparison Study: 01/30/2019 Performing Technologist: Debbe Bales RVS  Examination Guidelines: A complete evaluation includes at minimum, Doppler waveform signals and systolic blood pressure reading at the level of bilateral brachial, anterior tibial, and posterior tibial arteries, when vessel segments are accessible. Bilateral testing is considered an integral part of a complete examination. Photoelectric Plethysmograph (PPG) waveforms and toe systolic pressure readings are included as required and additional duplex testing as needed. Limited examinations for reoccurring indications may be performed as noted.  ABI Findings: +---------+------------------+-----+--------+--------+ Right    Rt Pressure  (mmHg)IndexWaveformComment  +---------+------------------+-----+--------+--------+ Brachial 132                                     +---------+------------------+-----+--------+--------+ ATA      143               1.04 biphasic         +---------+------------------+-----+--------+--------+ PTA      130               0.94 biphasic         +---------+------------------+-----+--------+--------+ Great Toe110               0.80 Normal           +---------+------------------+-----+--------+--------+ +---------+------------------+-----+--------+-------+ Left     Lt Pressure (mmHg)IndexWaveformComment +---------+------------------+-----+--------+-------+ Brachial 138                                    +---------+------------------+-----+--------+-------+ ATA      99                0.72 biphasic        +---------+------------------+-----+--------+-------+ PERO     103               0.75 biphasic        +---------+------------------+-----+--------+-------+ Great Toe71                0.51 Abnormal        +---------+------------------+-----+--------+-------+ +-------+-----------+-----------+------------+------------+ ABI/TBIToday's ABIToday's TBIPrevious ABIPrevious TBI +-------+-----------+-----------+------------+------------+ Right  1.04       .80        1.04        .55          +-------+-----------+-----------+------------+------------+ Left   .75        .51        .73         .83          +-------+-----------+-----------+------------+------------+ Right ABIs appear essentially unchanged compared to prior study on 01/30/2019. Left ABIs appear essentially unchanged compared to prior study on 01/30/2019. Rt TBIs appear to be increased compared to prior study on 01/30/2019. Lt TBIs appear to be decreased compared to prior study on 01/30/2019.  Summary: Right: Resting right ankle-brachial index is within normal range. No evidence of significant  right  lower extremity arterial disease. The right toe-brachial index is normal. Left: Resting left ankle-brachial index indicates moderate left lower extremity arterial disease. The left toe-brachial index is abnormal.  *See table(s) above for measurements and observations.  Electronically signed by Levora DredgeGregory Schnier MD on 02/01/2020 at 11:46:14 AM.    Final    ECHOCARDIOGRAM COMPLETE  Result Date: 02/13/2020    ECHOCARDIOGRAM REPORT   Patient Name:   Hannah Conway Date of Exam: 02/13/2020 Medical Rec #:  [id number]     Height:       62.0 in Accession #:    [id number]    Weight:       149.0 lb Date of Birth:  1929/11/04     BSA:          1.687 m Patient Age:    89 years      BP:           149/53 mmHg Patient Gender: F             HR:           81 bpm. Exam Location:  [facility] Procedure: Color Doppler, Cardiac Doppler, 2D Echo and Intracardiac            Opacification Agent Indications:     NSTEMI 121.4  History:         Patient has no prior history of Echocardiogram examinations.                  Risk Factors:Hypertension and Diabetes. PAD.  Sonographer:     Cristela BlueJerry Hege RDCS (AE) Referring Phys:  [id number] Vernetta HoneyJAN A MANSY Diagnosing Phys: Cristal Deerhristopher End MD  Sonographer Comments: No subcostal window, suboptimal apical window and suboptimal parasternal window. Image acquisition challenging due to COPD. IMPRESSIONS  1. Left ventricular ejection fraction, by estimation, is 45 to 50%. The left ventricle has mildly decreased function. The left ventricle demonstrates regional wall motion abnormalities (see scoring diagram/findings for description). There is moderate left ventricular hypertrophy. Left ventricular diastolic parameters are consistent with Grade II diastolic dysfunction (pseudonormalization). Elevated left atrial pressure. There is severe hypokinesis of the left ventricular, entire anterolateral wall and inferolateral wall.  2. Right ventricular systolic function is normal. The right ventricular size is normal. Tricuspid  regurgitation signal is inadequate for assessing PA pressure.  3. Left atrial size was mildly dilated.  4. The mitral valve is grossly normal. Moderate mitral valve regurgitation. No evidence of mitral stenosis.  5. Unable to determine valve morphology due to image quality. Aortic valve regurgitation is moderate. Severe aortic valve stenosis. Aortic valve mean gradient measures 40.0 mmHg. Aortic valve Vmax measures 3.98 m/s.  6. Pulmonic valve regurgitation not well assessed. FINDINGS  Left Ventricle: Left ventricular ejection fraction, by estimation, is 45 to 50%. The left ventricle has mildly decreased function. The left ventricle demonstrates regional wall motion abnormalities. Severe hypokinesis of the left ventricular, entire anterolateral wall and inferolateral wall. Definity contrast agent was given IV to delineate the left ventricular endocardial borders. The left ventricular internal cavity size was normal in size. There is moderate left ventricular hypertrophy. Left ventricular diastolic parameters are consistent with Grade II diastolic dysfunction (pseudonormalization). Elevated left atrial pressure. Right Ventricle: The right ventricular size is normal. Right vetricular wall thickness was not assessed. Right ventricular systolic function is normal. Tricuspid regurgitation signal is inadequate for assessing PA pressure. Left Atrium: Left atrial size was mildly dilated. Right Atrium: Right atrial size was normal in size. Pericardium: The pericardium  was not well visualized. Mitral Valve: The mitral valve is grossly normal. Moderate mitral annular calcification. Moderate mitral valve regurgitation. No evidence of mitral valve stenosis. Tricuspid Valve: The tricuspid valve is not well visualized. Tricuspid valve regurgitation is trivial. Aortic Valve: Unable to determine valve morphology due to image quality.. There is severe thickening and mild calcification of the aortic valve. Aortic valve regurgitation  is moderate. Severe aortic stenosis is present. There is severe thickening of the aortic valve. There is mild calcification of the aortic valve. Aortic valve mean gradient measures 40.0 mmHg. Aortic valve peak gradient measures 63.4 mmHg. Aortic valve area, by VTI measures 0.65 cm. Pulmonic Valve: The pulmonic valve was not well visualized. Pulmonic valve regurgitation not well assessed. Aorta: The aortic root is normal in size and structure. Pulmonary Artery: The pulmonary artery is not well seen. Venous: The inferior vena cava was not well visualized. IAS/Shunts: The interatrial septum was not well visualized.  LEFT VENTRICLE PLAX 2D LVIDd:         3.50 cm      Diastology LVIDs:         2.47 cm      LV e' lateral:   3.92 cm/s LV PW:         1.22 cm      LV E/e' lateral: 37.0 LV IVS:        1.05 cm      LV e' medial:    4.57 cm/s LVOT diam:     2.00 cm      LV E/e' medial:  31.7 LV SV:         57 LV SV Index:   34 LVOT Area:     3.14 cm  LV Volumes (MOD) LV vol d, MOD A2C: 96.8 ml LV vol d, MOD A4C: 110.0 ml LV vol s, MOD A2C: 48.3 ml LV vol s, MOD A4C: 57.4 ml LV SV MOD A2C:     48.5 ml LV SV MOD A4C:     110.0 ml LV SV MOD BP:      50.5 ml RIGHT VENTRICLE RV Basal diam:  2.67 cm RV S prime:     13.60 cm/s TAPSE (M-mode): 2.5 cm LEFT ATRIUM             Index       RIGHT ATRIUM           Index LA diam:        4.40 cm 2.61 cm/m  RA Area:     13.20 cm LA Vol (A2C):   82.9 ml 49.14 ml/m RA Volume:   30.10 ml  17.84 ml/m LA Vol (A4C):   49.6 ml 29.40 ml/m LA Biplane Vol: 64.4 ml 38.17 ml/m  AORTIC VALVE                    PULMONIC VALVE AV Area (Vmax):    0.69 cm     RVOT Peak grad: 6 mmHg AV Area (Vmean):   0.60 cm AV Area (VTI):     0.65 cm AV Vmax:           398.00 cm/s AV Vmean:          283.333 cm/s AV VTI:            0.889 m AV Peak Grad:      63.4 mmHg AV Mean Grad:      40.0 mmHg LVOT Vmax:         87.50 cm/s  LVOT Vmean:        53.800 cm/s LVOT VTI:          0.183 m LVOT/AV VTI ratio: 0.21  AORTA Ao  Root diam: 2.80 cm MITRAL VALVE MV Area (PHT): 3.30 cm     SHUNTS MV Decel Time: 230 msec     Systemic VTI:  0.18 m MV E velocity: 145.00 cm/s  Systemic Diam: 2.00 cm MV A velocity: 119.00 cm/s MV E/A ratio:  1.22 Cristal Deer End MD Electronically signed by Yvonne Kendall MD Signature Date/Time: 02/13/2020/10:47:14 AM    Final    VAS [place] LOWER EXTREMITY ARTERIAL DUPLEX  Result Date: 02/01/2020 LOWER EXTREMITY ARTERIAL DUPLEX STUDY Indications: Peripheral artery disease, and 08/09/2018              Bilat CIA PTA stents              Left EIA PTA stent              Right SFA to Pop PTA stent.  Vascular Interventions: 01/13/2019: PTA and Stent to the Right SFA and Popliteal                         Artery. Mechanical Thrombectomy of Occluded Rt SFA                         stents. Current ABI:            Rt 1.04, Lt .75 Comparison Study: 01/30/2019 Performing Technologist: Debbe Bales RVS  Examination Guidelines: A complete evaluation includes B-mode imaging, spectral Doppler, color Doppler, and power Doppler as needed of all accessible portions of each vessel. Bilateral testing is considered an integral part of a complete examination. Limited examinations for reoccurring indications may be performed as noted.  +-----------+--------+-----+--------+--------+--------+ RIGHT      PSV cm/sRatioStenosisWaveformComments +-----------+--------+-----+--------+--------+--------+ CFA Distal 178                  biphasic         +-----------+--------+-----+--------+--------+--------+ DFA        213                  biphasic         +-----------+--------+-----+--------+--------+--------+ SFA Prox   303                  biphasicStent    +-----------+--------+-----+--------+--------+--------+ SFA Mid    95                   biphasicstent    +-----------+--------+-----+--------+--------+--------+ SFA Distal 69                   biphasic          +-----------+--------+-----+--------+--------+--------+ POP Distal 102                  biphasic         +-----------+--------+-----+--------+--------+--------+ ATA Distal 85                   biphasic         +-----------+--------+-----+--------+--------+--------+ PTA Distal 14                   biphasic         +-----------+--------+-----+--------+--------+--------+ PERO Distal41                   biphasic         +-----------+--------+-----+--------+--------+--------+  +-----------+--------+-----+--------+--------+--------+  LEFT       PSV cm/sRatioStenosisWaveformComments +-----------+--------+-----+--------+--------+--------+ CFA Distal 135                  biphasic         +-----------+--------+-----+--------+--------+--------+ DFA        87                   biphasic         +-----------+--------+-----+--------+--------+--------+ SFA Prox   89                   biphasic         +-----------+--------+-----+--------+--------+--------+ SFA Mid    195                  biphasic         +-----------+--------+-----+--------+--------+--------+ SFA Distal 46                   biphasic         +-----------+--------+-----+--------+--------+--------+ POP Distal 70                   biphasic         +-----------+--------+-----+--------+--------+--------+ ATA Distal 49                   biphasic         +-----------+--------+-----+--------+--------+--------+ PTA Distal 0                    Absent           +-----------+--------+-----+--------+--------+--------+ PERO Distal26                   biphasic         +-----------+--------+-----+--------+--------+--------+   Summary: Right: Imaging and Waveforms obtained throughout in the Right Lower Extremity. Biphasic Waveforms seen predominantly in the Right Lower Extremity. Left: Imaging and Waveforms obtained throughout in the Left Lower Extremity. Biphasic Waveforms seen  predominantly in the Left Lower Extremity.  See table(s) above for measurements and observations. Electronically signed by Levora Dredge MD on 02/01/2020 at 11:46:17 AM.    Final     Assessment & Plan      1.Non-ST elevation myocardial infarction-echo showed wall motion abnormalities with moderate reduced LV function. Not a candidate for invasive evaluation at present given comorbidities. Currently on aspirin and Plavix. Continues with beta-blocker. We will continue with this for now and when more stable consider cardiac cathto evaluate coronary anatomy as well as aortic valve.  Currently renal function is somewhat decreased from yesterday.  Will need to reduce diuresis.  Creatinine 1.6.  Not candidate for cath at present. Appreciated nephrology input. Marland Kitchen  2.Iron deficiency anemia-had one dark stoolhowever no active bleeding while on anticoagulation and antiplatelet therapy. Hemoglobin 8.9 this morning..   No active bleeding at present is noted.  Will remain on aspirin and Plavix.    3.Respiratory failure-on oxygen supplementation. Has underlying COPD as well as most likely diastolic failure secondary to her aortic valve disease.Continue with abx.   4.Heart failure-EF mildly reduced with anterolateral hypokinesis severe AS and moderate MR. We will continue with diuretics although we will reduce her dose..  5. CKD-appreciate nephrology input.    Signed, Darlin Priestly Lin Hackmann MD 02/19/2020, 7:18 AM  Pager: [phone]

## 2020-02-19 NOTE — Progress Notes (Signed)
Inpatient Diabetes Program Recommendations  AACE/ADA: New Consensus Statement on Inpatient Glycemic Control (2015)  Target Ranges:  Prepandial:   less than 140 mg/dL      Peak postprandial:   less than 180 mg/dL (1-2 hours)      Critically ill patients:  140 - 180 mg/dL   Lab Results  Component Value Date   GLUCAP 192 (H) 02/19/2020   HGBA1C 6.5 (H) 02/16/2020    Review of Glycemic Control Results for Hannah Conway, Hannah Conway (MRN [id number]) as of 02/19/2020 11:23  Ref. Range 02/18/2020 07:54 02/18/2020 11:49 02/18/2020 16:15 02/18/2020 21:39 02/19/2020 07:43  Glucose-Capillary Latest Ref Range: 70 - 99 mg/dL 453 (H) 646 (H) 803 (H) 324 (H) 192 (H)   Diabetes history:DM 2 Outpatient Diabetes medications: Novolog 70/30 55 units q AM and 35 units 70/30 in the PM Current orders for Inpatient glycemic control: Lantus 30 units + Novolog 5 units tid meal coverage + Novolog correction 0-20 units tid    Inpatient Diabetes Program Recommendations:    Postprandial CBGs remain elevated. -Increase Novolog meal coverage to 7 units tid meal coverage if eats 50%  Thank you, Billy Fischer. Annabeth Tortora, RN, MSN, CDE  Diabetes Coordinator Inpatient Glycemic Control Team Team Pager [phone] (8am-5pm) 02/19/2020 11:24 AM

## 2020-02-19 NOTE — Progress Notes (Addendum)
Hannah Bouillon, MD [address], [address], [place], [place], [address] [address], [place], [place], [address] Phone: [phone]  Fax: [phone]   Subjective: Patient sitting up in chair with daughter at bedside.  Wearing O2 via nasal cannula.  Patient states she is feeling better but continues to have some shortness of breath   Objective: Exam: Vital signs in last 24 hours: Vitals:   02/18/20 1954 02/18/20 2016 02/19/20 0613 02/19/20 0744  BP: (!) 125/51  (!) 143/65 122/82  Pulse: 71  64 (!) 109  Resp: 20  20 18   Temp: 98.1 F (36.7 C)  98.6 F (37 C) 98.2 F (36.8 C)  TempSrc:    Oral  SpO2: 100% 100% 100% 100%  Weight:   71.6 kg   Height:       Weight change: 0.223 kg  Intake/Output Summary (Last 24 hours) at 02/19/2020 1121 Last data filed at 02/19/2020 0900 Gross per 24 hour  Intake 837.94 ml  Output 1300 ml  Net -462.06 ml    General: No acute distress, AAO x3 Abd: Soft, NT/ND, No HSM Skin: Warm, no rashes Neck: Supple, Trachea midline   Lab Results: Lab Results  Component Value Date   WBC 19.2 (H) 02/19/2020   HGB 8.9 (L) 02/19/2020   HCT 28.6 (L) 02/19/2020   MCV 81.0 02/19/2020   PLT 381 02/19/2020   Micro Results: Recent Results (from the past 240 hour(s))  SARS CORONAVIRUS 2 (TAT 6-24 HRS) Nasopharyngeal Nasopharyngeal Swab     Status: None   Collection Time: 02/09/20  3:43 PM   Specimen: Nasopharyngeal Swab  Result Value Ref Range Status   SARS Coronavirus 2 NEGATIVE NEGATIVE Final    Comment: (NOTE) SARS-CoV-2 target nucleic acids are NOT DETECTED.  The SARS-CoV-2 RNA is generally detectable in upper and lower respiratory specimens during the acute phase of infection. Negative results do not preclude SARS-CoV-2 infection, do not rule out co-infections with other pathogens, and should not be used as the sole basis for treatment or other patient management decisions. Negative results must be combined with clinical  observations, patient history, and epidemiological information. The expected result is Negative.  Fact Sheet for Patients: 02/11/20  Fact Sheet for Healthcare Providers: HairSlick.no  This test is not yet approved or cleared by the quierodirigir.com FDA and  has been authorized for detection and/or diagnosis of SARS-CoV-2 by FDA under an Emergency Use Authorization (EUA). This EUA will remain  in effect (meaning this test can be used) for the duration of the COVID-19 declaration under Se ction 564(b)(1) of the Act, 21 U.S.C. section 360bbb-3(b)(1), unless the authorization is terminated or revoked sooner.  Performed at [facility], [address] [place], [place]   SARS Coronavirus 2 by RT PCR (hospital order, performed in [facility] hospital lab) Nasopharyngeal Nasopharyngeal Swab     Status: None   Collection Time: 02/13/20  1:37 AM   Specimen: Nasopharyngeal Swab  Result Value Ref Range Status   SARS Coronavirus 2 NEGATIVE NEGATIVE Final    Comment: (NOTE) SARS-CoV-2 target nucleic acids are NOT DETECTED.  The SARS-CoV-2 RNA is generally detectable in upper and lower respiratory specimens during the acute phase of infection. The lowest concentration of SARS-CoV-2 viral copies this assay can detect is 250 copies / mL. A negative result does not preclude SARS-CoV-2 infection and should not be used as the sole basis for treatment or other patient management decisions.  A negative result may  occur with improper specimen collection / handling, submission of specimen other than nasopharyngeal swab, presence of viral mutation(s) within the areas targeted by this assay, and inadequate number of viral copies (<250 copies / mL). A negative result must be combined with clinical observations, patient history, and epidemiological information.  Fact Sheet for Patients:     BoilerBrush.com.cy  Fact Sheet for Healthcare Providers: https://pope.com/  This test is not yet approved or  cleared by the [place] FDA and has been authorized for detection and/or diagnosis of SARS-CoV-2 by FDA under an Emergency Use Authorization (EUA).  This EUA will remain in effect (meaning this test can be used) for the duration of the COVID-19 declaration under Section 564(b)(1) of the Act, 21 U.S.C. section 360bbb-3(b)(1), unless the authorization is terminated or revoked sooner.  Performed at [facility], [address] [place], [place] [address]   MRSA PCR Screening     Status: None   Collection Time: 02/16/20  4:53 PM   Specimen: Nasopharyngeal  Result Value Ref Range Status   MRSA by PCR NEGATIVE NEGATIVE Final    Comment:        The GeneXpert MRSA Assay (FDA approved for NASAL specimens only), is one component of a comprehensive MRSA colonization surveillance program. It is not intended to diagnose MRSA infection nor to guide or monitor treatment for MRSA infections. Performed at [facility], [address] [place], [place] [address]    Studies/Results: [facility] Chest [place] 1 View  Result Date: 02/17/2020 CLINICAL DATA:  Hypoxia, current smoker EXAM: PORTABLE CHEST 1 VIEW COMPARISON:  Chest radiograph from one day prior. FINDINGS: Stable cardiomediastinal silhouette with mild cardiomegaly. No pneumothorax. Small stable right pleural effusion. No left pleural effusion. Diffuse prominence of the parahilar interstitial markings, stable. Patchy opacities at both lung bases, stable. IMPRESSION: 1. Stable mild cardiomegaly with diffuse prominence of the parahilar interstitial markings, suggesting pulmonary edema. 2. Stable small right pleural effusion. 3. Stable patchy bibasilar lung opacities, favor atelectasis, cannot exclude a component of aspiration or pneumonia. Electronically Signed   By: Delbert Phenix M.D.   On: 02/17/2020 15:36   Medications:  Scheduled Meds: . aspirin EC  81 mg Oral Daily  . budesonide (PULMICORT) nebulizer solution  0.25 mg Nebulization BID  . buPROPion  150 mg Oral Daily  . calcium-vitamin D  1 tablet Oral Q lunch  . clopidogrel  75 mg Oral Daily  . doxycycline  100 mg Oral Q12H  . feeding supplement (ENSURE ENLIVE)  237 mL Oral BID BM  . furosemide  20 mg Intravenous BID  . insulin aspart  0-20 Units Subcutaneous TID WC  . insulin aspart  5 Units Subcutaneous TID WC  . insulin glargine  30 Units Subcutaneous QHS  . ipratropium-albuterol  3 mL Nebulization Q6H  . metoprolol tartrate  12.5 mg Oral BID  . multivitamin with minerals  1 tablet Oral Daily  . omega-3 acid ethyl esters  1 g Oral Daily  . pantoprazole (PROTONIX) IV  40 mg Intravenous Q12H  . pravastatin  40 mg Oral q1800  . predniSONE  40 mg Oral Q breakfast  . sodium chloride flush  3 mL Intravenous Q12H   Continuous Infusions: . sodium chloride    . ceFEPime (MAXIPIME) IV 2 g (02/19/20 0844)   PRN Meds:.sodium chloride, acetaminophen, albuterol, ALPRAZolam, ondansetron (ZOFRAN) IV, sodium chloride flush, zolpidem   Assessment: Principal Problem:   Acute hypoxemic respiratory failure (HCC) Active Problems:   Hypertension associated with  type 2 diabetes mellitus (HCC)   Type 2 diabetes mellitus with stage 3 chronic kidney disease, with long-term current use of insulin (HCC)   NSTEMI (non-ST elevated myocardial infarction) (HCC)   COPD with acute exacerbation (HCC)    Plan: Patient has not had any active GI bleeding Hemoglobin is stable As stated previously, endoscopic procedures would be high risk at this time. Patient specifically states she is not interested in endoscopic procedures at this time either  Patient should follow-up in GI clinic within 3 to 4 weeks of discharge at which time need for endoscopic procedures and risks and benefits at that time can be  rediscussed  Replace IV iron as necessary  Management of NSTEMI as per cardiology   GI service will sign off, please page with any questions or concerns or any active signs of GI bleeding   LOS: 6 days   Hannah Bouillon, MD 02/19/2020, 11:21 AM

## 2020-02-19 NOTE — Consult Note (Signed)
Name: Hannah Conway MRN: [id number] DOB: [birth year]     CONSULTATION DATE: 02/19/2020  REFERRING MD : Anthoney Harada  CHIEF COMPLAINT:   STUDIES:     CXR independently reviewed by Me B/l lower lobe opacities, low lung volumes    HISTORY OF PRESENT ILLNESS: 86 yo female with progressive SOB Upon presentation to the emergency room, blood pressure was 149/53 and pulse 70 was 90% on CPAP with otherwise normal vital signs.  Labs revealed VBG with pH 7.42 and bicarbonate of 25.9.  CMP was unremarkable.  BNP was 1085.6.  High-sensitivity troponin I was 2180 and later 1848 with procalcitonin less than 0.1.  CBC showed leukocytosis of 14.5 with anemia with hemoglobin of 8 hematocrit 26.1.  Chest x-ray showed diffuse interstitial opacity worse in the lower lobes likely indicating pulmonary edema   Patient still smokes, has dx of COPD and SCHF EF 45% Patient without fevers, productive cough Most symptoms are SOB and DOE CXR shows interstitial edema and effusions  Creatinine seems to be worsening, patient has been getting lasix therapy currently on Prednisone and Cefepime Patient was initially diagnosed with NSTEMI upon admission Patient with HR elevated to 160 today  PCCM consulted for SOB  Patient with h/o severe snoring and has high probability of OSA   PAST MEDICAL HISTORY :   has a past medical history of Diabetes mellitus without complication (HCC), Hypertension, and Peripheral artery disease (HCC).  has a past surgical history that includes Abdominal hysterectomy; Cholecystectomy (1965); Joint replacement (2016); Lower Extremity Angiography (Right, 08/09/2018); and Lower Extremity Angiography (Right, 01/13/2019). Prior to Admission medications   Medication Sig Start Date End Date Taking? Authorizing Provider  acetaminophen (TYLENOL) 500 MG tablet Take 1,000 mg by mouth every 6 (six) hours as needed for moderate pain.    Yes [provider]  aspirin EC 81 MG tablet Take 81 mg by  mouth daily.    Yes [provider]  benazepril-hydrochlorthiazide (LOTENSIN HCT) 20-12.5 MG tablet Take 1 tablet by mouth daily. 11/27/17  Yes [provider]  buPROPion (WELLBUTRIN XL) 150 MG 24 hr tablet Take 150 mg by mouth daily.   Yes [provider]  Calcium Citrate-Vitamin D (CALCIUM CITRATE + D3 PO) Take 2 tablets by mouth daily with lunch.   Yes [provider]  Cinnamon 500 MG TABS Take 500 mg by mouth daily.   Yes [provider]  clopidogrel (PLAVIX) 75 MG tablet Take 1 tablet by mouth once daily with breakfast Patient taking differently: Take 75 mg by mouth daily.  02/07/20  Yes Georgiana Spinner, NP  Multiple Vitamin (MULTIVITAMIN WITH MINERALS) TABS tablet Take 1 tablet by mouth daily. Complete Multivitamin   Yes [provider]  NOVOLOG MIX 70/30 FLEXPEN (70-30) 100 UNIT/ML FlexPen Inject 35-55 Units into the skin See admin instructions. Inject 55 units subcutaneously in the morning and 35 in the evening 02/01/18  Yes [provider]  Omega-3 Fatty Acids (FISH OIL) 1000 MG CAPS Take 1,000 mg by mouth 2 (two) times daily.   Yes [provider]  pravastatin (PRAVACHOL) 40 MG tablet Take 40 mg by mouth daily. 01/23/20  Yes [provider]  oxyCODONE (OXY IR/ROXICODONE) 5 MG immediate release tablet Take 1 tablet (5 mg total) by mouth every 6 (six) hours as needed for moderate pain or severe pain. Patient not taking: Reported on 01/05/2019 08/10/18   Annice Needy, MD   Allergies  Allergen Reactions  . Atorvastatin Other (See Comments)    "  Felt as if she would pass out"  . Simvastatin Other (See Comments)    "Felt as if she would pass out"     FAMILY HISTORY:  family history includes Congestive Heart Failure in her mother; Diabetes in her father and sister. SOCIAL HISTORY:  reports that she has been smoking cigarettes. She has a 30.00 pack-year smoking history. She has never used smokeless tobacco. She reports  previous drug use. She reports that she does not drink alcohol.    Review of Systems:  Gen:  +faitgue +SOB HEENT: Denies blurred vision, double vision, ear pain, eye pain, hearing loss, nose bleeds, sore throat Cardiac:  No dizziness, chest pain or heaviness, chest tightness,edema, No JVD Resp:   No cough, -sputum production, +shortness of breath,+wheezing, -hemoptysis,  Gi: Denies swallowing difficulty, stomach pain, nausea or vomiting, diarrhea, constipation, bowel incontinence Gu:  Denies bladder incontinence, burning urine Ext:   Denies Joint pain, stiffness or swelling Skin: Denies  skin rash, easy bruising or bleeding or hives Endoc:  Denies polyuria, polydipsia , polyphagia or weight change Psych:   Denies depression, insomnia or hallucinations  Other:  All other systems negative     VITAL SIGNS: Temp:  [98.1 F (36.7 C)-98.6 F (37 C)] 98.2 F (36.8 C) (08/23 0744) Pulse Rate:  [64-109] 109 (08/23 0744) Resp:  [18-20] 18 (08/23 0744) BP: (122-143)/(51-82) 122/82 (08/23 0744) SpO2:  [99 %-100 %] 99 % (08/23 1417) Weight:  [71.6 kg] 71.6 kg (08/23 0613)     SpO2: 99 % O2 Flow Rate (L/min): 4 L/min    Physical Examination:   GENERAL:mild resp distress HEAD: Normocephalic, atraumatic.  EYES: PERLA, EOMI No scleral icterus.  MOUTH: Moist mucosal membrane.  EAR, NOSE, THROAT: Clear without exudates. No external lesions.  NECK: Supple.  PULMONARY: CTA B/L no wheezing, +rhonchi, +crackles CARDIOVASCULAR: S1 and S2. Regular rate and rhythm. No murmurs GASTROINTESTINAL: Soft, nontender, nondistended. Positive bowel sounds.  MUSCULOSKELETAL: No swelling, clubbing, or edema.  NEUROLOGIC: No gross focal neurological deficits. 5/5 strength all extremities SKIN: No ulceration, lesions, rashes, or cyanosis.  PSYCHIATRIC: Insight, judgment intact. -depression -anxiety ALL OTHER ROS ARE NEGATIVE   MEDICATIONS: I have reviewed all medications and confirmed regimen as  documented    CULTURE RESULTS   Recent Results (from the past 240 hour(s))  SARS Coronavirus 2 by RT PCR (hospital order, performed in [facility] hospital lab) Nasopharyngeal Nasopharyngeal Swab     Status: None   Collection Time: 02/13/20  1:37 AM   Specimen: Nasopharyngeal Swab  Result Value Ref Range Status   SARS Coronavirus 2 NEGATIVE NEGATIVE Final    Comment: (NOTE) SARS-CoV-2 target nucleic acids are NOT DETECTED.  The SARS-CoV-2 RNA is generally detectable in upper and lower respiratory specimens during the acute phase of infection. The lowest concentration of SARS-CoV-2 viral copies this assay can detect is 250 copies / mL. A negative result does not preclude SARS-CoV-2 infection and should not be used as the sole basis for treatment or other patient management decisions.  A negative result may occur with improper specimen collection / handling, submission of specimen other than nasopharyngeal swab, presence of viral mutation(s) within the areas targeted by this assay, and inadequate number of viral copies (<250 copies / mL). A negative result must be combined with clinical observations, patient history, and epidemiological information.  Fact Sheet for Patients:   BoilerBrush.com.cy  Fact Sheet for Healthcare Providers: https://pope.com/  This test is not yet approved or  cleared by the [place]  FDA and has been authorized for detection and/or diagnosis of SARS-CoV-2 by FDA under an Emergency Use Authorization (EUA).  This EUA will remain in effect (meaning this test can be used) for the duration of the COVID-19 declaration under Section 564(b)(1) of the Act, 21 U.S.C. section 360bbb-3(b)(1), unless the authorization is terminated or revoked sooner.  Performed at [facility], [address] [place], [place] [address]   MRSA PCR Screening     Status: None   Collection Time: 02/16/20  4:53 PM    Specimen: Nasopharyngeal  Result Value Ref Range Status   MRSA by PCR NEGATIVE NEGATIVE Final    Comment:        The GeneXpert MRSA Assay (FDA approved for NASAL specimens only), is one component of a comprehensive MRSA colonization surveillance program. It is not intended to diagnose MRSA infection nor to guide or monitor treatment for MRSA infections. Performed at [facility], [address], [place], [place] [address]          ASSESSMENT AND PLAN SYNOPSIS  86 yo female with increased WOB likely from acute sCHF exacerbation due to NSTEMI with acute COPD exacerbation with probable underlying COPD   SEVERE COPD EXACERBATION -continue  steroids as prescribed -continue NEB THERAPY as prescribed -morphine as needed -wean fio2 as needed and tolerated On ABX   ACUTE SYSTOLIC CARDIAC FAILURE- NSTEMI -oxygen as needed -Lasix as tolerated -follow up cardiac enzymes as indicated -follow up cardiology recs   ACUTE KIDNEY INJURY/Renal Failure -continue Foley Catheter-assess need -Avoid nephrotoxic agents -Follow urine output, BMP -Ensure adequate renal perfusion, optimize oxygenation -Renal dose medications  Morbid obesity, possible OSA.   Will certainly impact respiratory mechanics Will try autoCPAP tonight, patient willing to try it out  ELECTROLYTES -follow labs as needed -replace as needed   Patient/Family are satisfied with Plan of action and management. All questions answered  Lucie Leather, M.D.  [facility]  Medical Director [facility] Medical Director [facility] Cardio-Pulmonary Department

## 2020-02-19 NOTE — Progress Notes (Signed)
PROGRESS NOTE    Hannah Conway    Code Status: Full Code  DGU:[id number] DOB: [birth year] DOA: 02/13/2020 LOS: 6 days  PCP: Mickey Farber, MD CC:  Chief Complaint  Patient presents with  . Respiratory Distress       Hospital Summary   This is an 86 year old female with a past medical history of diabetes, hypertension, PVD who presented to the ED on 8/17 with acutely worsening dyspnea and respiratory distress x4 days which had worsened overnight prior to admission.  Also had dry cough, nausea without vomiting.  Denied any chest pain or palpitations  Upon presentation to the emergency room, blood pressure was 149/53 and pulse 70 was 90% on CPAP with otherwise normal vital signs.  Labs revealed VBG with pH 7.42 and bicarbonate of 25.9.  CMP was unremarkable.  BNP was 1085.6.  High-sensitivity troponin I was 2180 and later 1848 with procalcitonin less than 0.1.  CBC showed leukocytosis of 14.5 with anemia with hemoglobin of 8 hematocrit 26.1.  Chest x-ray showed diffuse interstitial opacity worse in the lower lobes likely indicating pulmonary edema., EKG showed sinus rhythm with rate of 85 with PACs, incomplete right bundle branch block and prolonged QT interval with QTC of 503 MS.  The patient was given aspirin as well as 20 mg of IV Lasix, IV heparin bolus and drip.    She was admitted to a progressive bed and cardiology was consulted.  An echo was obtained and showed EF 45 to 50% moderate LVH, grade 2 diastolic dysfunction and severe hypokinesis of left ventricle and entire anterolateral wall and inferior lateral wall as well as moderate MR and severe AS and moderate aortic regurgitation.  8/18: Hb 6.7 -> 7.4 on repeat.  Given 1 unit PRBC transfusion in setting of NSTEMI 8/20: COPD exacerbation with wheezing and increased work of breathing. CXR concerning for both pulmonary edema now developing consolidation. Started on Cefepime, steroids and bronchodilators 8/21: Respiratory distress/volume  overloaded. Increased Lasix 8/22: Somewhat improved respiratory status but increased Cr. Decreased lasix per cardio. Nephrology on board for hyponatremia and aki. Added on pulmicort and doxycycline.    A & P   Principal Problem:   Acute hypoxemic respiratory failure (HCC) Active Problems:   Hypertension associated with type 2 diabetes mellitus (HCC)   Type 2 diabetes mellitus with stage 3 chronic kidney disease, with long-term current use of insulin (HCC)   NSTEMI (non-ST elevated myocardial infarction) (HCC)   COPD with acute exacerbation (HCC)   1. NSTEMI, stable a. Echo with wall motion and valvular abnormalities as above b. Continue heparin drip, aspirin, plavix, beta blocker, pravastatin c. Continue telemetry d. Cardio: Continue medical management and consider cardiac cath prior to discharge but not a candidate currently given comorbidities  2. Acute on chronic iron deficiency anemia while on heparin, concern for chronic GI bleed s/p 1 u PRBC and IV Iron stable a. 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 GI consulted: Likely chronic.  Given her NSTEMI, heart failure and respiratory issues she is at high risk for anesthesia and endoscopic evaluation especially since she is not acutely bleeding.  Follow up with GI in the office 3-4 weeks after discharge for endoscopic procedures  3. Acute hypoxic respiratory failure, Multifactorial: HFpEF exacerbation, COPD exacerbation  Concern for HAP stable a. Continue bronchodilators, steroids (Day 4/5), Cefepime (Day 4/5), Doxycycline (Day 2/5)  and Pulmicort b. Hold antitussives as the patient had altered mental status with these  meds and has a prolonged QTc so Tessalon pearls are contraindicated c. Incentive spirometry d. She should not skip neb treatments - discussed with RT e. Diuresis per Cardio f. Pulmonary consult since she is not improving  4. Diastolic heart failure  exacerbation a. Echo: EF 45 to 50% moderate LVH, grade 2 diastolic dysfunction and severe hypokinesis of left ventricle and entire anterolateral wall and inferior lateral wall as well as moderate MR and severe AS and moderate aortic regurgitation b. Discontinued ACE due to AKI c. Diuresis per Cardio d. Fluid restrict e. Continue heart failure protocol   5. AKI on CKD 3a, likely multifactorial: Cardiorenal, anemia, hemodynamic changes, ACE inhibitor/HCTZ  a. Cr bump today b. Holding benzapril c. Nephrology consulted  6. Hyponatremia, unclear etiology possibly SIADH a. Nephrology on board b. Fluid restriction  7. Leukocytosis, likely steroid induced and from pneumonia a. Peaked at 20, currently 19  8. Hypertension a. Continue current management  9. Hyperlipidemia, Stable  10. Type 2 diabetes a. Currently on steroids b. Increase Novolog to 10 u TID with meals c. Resistant sliding scale  d. Lantus 30 units daily  11. Depression a. Continue Wellbutrin XL  12. Asymptomatic UTI a. Continue Cefepime for above   DVT prophylaxis: Heparin   Family Communication: Patient's family at bedside has been updated today   Disposition Plan:  Status is: Inpatient  Remains inpatient appropriate because:Unsafe d/c plan, IV treatments appropriate due to intensity of illness or inability to take PO and Inpatient level of care appropriate due to severity of illness   Dispo: The patient is from: Home              Anticipated d/c is to: TBD              Anticipated d/c date is: > 3 days              Patient currently is not medically stable to d/c.          Pressure injury documentation    None  Consultants  Cardiology  GI Nephrology  Procedures  None  Antibiotics   Anti-infectives (From admission, onward)   Start     Dose/Rate Route Frequency Ordered Stop   02/18/20 1000  doxycycline (VIBRA-TABS) tablet 100 mg        100 mg Oral Every 12 hours 02/18/20 0936 02/23/20  0959   02/16/20 1500  ceFEPIme (MAXIPIME) 2 g in sodium chloride 0.9 % 100 mL IVPB        2 g 200 mL/hr over 30 Minutes Intravenous Daily 02/16/20 1428          Subjective   Still short of breath but did not receive her neb treatment this morning as of yet and missed a neb treatment last night. No other events overnight. No other complaints  Objective   Vitals:   02/18/20 1954 02/18/20 2016 02/19/20 0613 02/19/20 0744  BP: (!) 125/51  (!) 143/65 122/82  Pulse: 71  64 (!) 109  Resp: 20  20 18   Temp: 98.1 F (36.7 C)  98.6 F (37 C) 98.2 F (36.8 C)  TempSrc:    Oral  SpO2: 100% 100% 100% 100%  Weight:   71.6 kg   Height:        Intake/Output Summary (Last 24 hours) at 02/19/2020 1420 Last data filed at 02/19/2020 0900 Gross per 24 hour  Intake 597.94 ml  Output 1300 ml  Net -702.06 ml   [employer] PowerFiled Weights  02/17/20 0315 02/18/20 0324 02/19/20 1610  Weight: 71.1 kg 71.4 kg 71.6 kg    Examination:  Physical Exam Vitals and nursing note reviewed.  HENT:     Head: Normocephalic.     Mouth/Throat:     Mouth: Mucous membranes are moist.  Eyes:     Conjunctiva/sclera: Conjunctivae normal.  Cardiovascular:     Rate and Rhythm: Normal rate.     Heart sounds: Murmur heard.   Pulmonary:     Comments: Pursed lip breathing No wheeze today No rales on exam Abdominal:     General: There is no distension.     Tenderness: There is no abdominal tenderness.  Musculoskeletal:        General: No swelling or tenderness.  Neurological:     General: No focal deficit present.     Mental Status: She is alert.  Psychiatric:        Mood and Affect: Mood normal.     Data Reviewed: I have personally reviewed following labs and imaging studies  CBC: Recent Labs  Lab 02/13/20 0137 02/13/20 0137 02/14/20 0518 02/14/20 0707 02/15/20 0411 02/16/20 0517 02/17/20 0705 02/18/20 0612 02/19/20 0858  WBC 14.5*   < > 19.1*   < > 15.7* 16.2* 16.9* 20.3* 19.2*  NEUTROABS 8.9*  --   11.4*  --  9.4* 11.1*  --   --   --   HGB 8.0*   < > 6.7*   < > 8.5* 8.5* 8.1* 8.7* 8.9*  HCT 26.1*   < > 22.3*   < > 28.1* 26.2* 25.8* 26.8* 28.6*  MCV 79.6*   < > 80.2   < > 81.2 76.8* 79.6* 77.7* 81.0  PLT 469*   < > 417*   < > 409* 393 403* 411* 381   < > = values in this interval not displayed.   Basic Metabolic Panel: Recent Labs  Lab 02/13/20 0137 02/13/20 0656 02/14/20 0707 02/15/20 0411 02/16/20 0517 02/17/20 0705 02/18/20 0612 02/19/20 0744  NA 130*  --    < > 129* 126* 130* 131* 131*  K 4.2  --    < > 4.3 4.0 3.4* 4.4 4.6  CL 91*  --    < > 91* 87* 89* 92* 92*  CO2 27  --    < > GLUCOSE 173*  --    < > 175* 160* 139* 125* 202*  BUN 24*  --    < > 48* 47* 43* 47* 60*  CREATININE 1.19*  --    < > 1.52* 1.30* 1.29* 1.46* 1.60*  CALCIUM 9.9  --    < > 9.3 8.9 9.1 9.3 9.4  MG 2.8* 2.1  --  1.9 1.9 2.2  --   --    < > = values in this interval not displayed.   GFR: Estimated Creatinine Clearance: 22.5 mL/min (A) (by C-G formula based on SCr of 1.6 mg/dL (H)). Liver Function Tests: Recent Labs  Lab 02/13/20 0137  AST 48*  ALT 21  ALKPHOS 91  BILITOT 0.5  PROT 7.8  ALBUMIN 3.4*   No results for input(s): LIPASE, AMYLASE in the last 168 hours. No results for input(s): AMMONIA in the last 168 hours. Coagulation Profile: Recent Labs  Lab 02/13/20 0137  INR 1.0   Cardiac Enzymes: No results for input(s): CKTOTAL, CKMB, CKMBINDEX, TROPONINI in the last 168 hours. BNP (last 3 results) No results for input(s): PROBNP in the last  8760 hours. HbA1C: No results for input(s): HGBA1C in the last 72 hours. CBG: Recent Labs  Lab 02/18/20 1149 02/18/20 1615 02/18/20 2139 02/19/20 0743 02/19/20 1146  GLUCAP 210* 323* 324* 192* 225*   Lipid Profile: No results for input(s): CHOL, HDL, LDLCALC, TRIG, CHOLHDL, LDLDIRECT in the last 72 hours. Thyroid Function Tests: No results for input(s): TSH, T4TOTAL, FREET4, T3FREE, THYROIDAB in the last 72  hours. Anemia Panel: No results for input(s): VITAMINB12, FOLATE, FERRITIN, TIBC, IRON, RETICCTPCT in the last 72 hours. Sepsis Labs: Recent Labs  Lab 02/13/20 0137  PROCALCITON <0.10    Recent Results (from the past 240 hour(s))  SARS CORONAVIRUS 2 (TAT 6-24 HRS) Nasopharyngeal Nasopharyngeal Swab     Status: None   Collection Time: 02/09/20  3:43 PM   Specimen: Nasopharyngeal Swab  Result Value Ref Range Status   SARS Coronavirus 2 NEGATIVE NEGATIVE Final    Comment: (NOTE) SARS-CoV-2 target nucleic acids are NOT DETECTED.  The SARS-CoV-2 RNA is generally detectable in upper and lower respiratory specimens during the acute phase of infection. Negative results do not preclude SARS-CoV-2 infection, do not rule out co-infections with other pathogens, and should not be used as the sole basis for treatment or other patient management decisions. Negative results must be combined with clinical observations, patient history, and epidemiological information. The expected result is Negative.  Fact Sheet for Patients: HairSlick.no  Fact Sheet for Healthcare Providers: quierodirigir.com  This test is not yet approved or cleared by the [place] FDA and  has been authorized for detection and/or diagnosis of SARS-CoV-2 by FDA under an Emergency Use Authorization (EUA). This EUA will remain  in effect (meaning this test can be used) for the duration of the COVID-19 declaration under Se ction 564(b)(1) of the Act, 21 U.S.C. section 360bbb-3(b)(1), unless the authorization is terminated or revoked sooner.  Performed at [facility], [address] [place], [place] [address]   SARS Coronavirus 2 by RT PCR (hospital order, performed in [facility] hospital lab) Nasopharyngeal Nasopharyngeal Swab     Status: None   Collection Time: 02/13/20  1:37 AM   Specimen: Nasopharyngeal Swab  Result Value Ref Range Status   SARS  Coronavirus 2 NEGATIVE NEGATIVE Final    Comment: (NOTE) SARS-CoV-2 target nucleic acids are NOT DETECTED.  The SARS-CoV-2 RNA is generally detectable in upper and lower respiratory specimens during the acute phase of infection. The lowest concentration of SARS-CoV-2 viral copies this assay can detect is 250 copies / mL. A negative result does not preclude SARS-CoV-2 infection and should not be used as the sole basis for treatment or other patient management decisions.  A negative result may occur with improper specimen collection / handling, submission of specimen other than nasopharyngeal swab, presence of viral mutation(s) within the areas targeted by this assay, and inadequate number of viral copies (<250 copies / mL). A negative result must be combined with clinical observations, patient history, and epidemiological information.  Fact Sheet for Patients:   BoilerBrush.com.cy  Fact Sheet for Healthcare Providers: https://pope.com/  This test is not yet approved or  cleared by the [place] FDA and has been authorized for detection and/or diagnosis of SARS-CoV-2 by FDA under an Emergency Use Authorization (EUA).  This EUA will remain in effect (meaning this test can be used) for the duration of the COVID-19 declaration under Section 564(b)(1) of the Act, 21 U.S.C. section 360bbb-3(b)(1), unless the authorization is terminated or revoked sooner.  Performed at [facility], [address] [place], [place] [address]   MRSA PCR Screening     Status: None   Collection Time: 02/16/20  4:53 PM   Specimen: Nasopharyngeal  Result Value Ref Range Status   MRSA by PCR NEGATIVE NEGATIVE Final    Comment:        The GeneXpert MRSA Assay (FDA approved for NASAL specimens only), is one component of a comprehensive MRSA colonization surveillance program. It is not intended to diagnose MRSA infection nor to guide or monitor  treatment for MRSA infections. Performed at [facility], [address], [place], [place] [address]          Radiology Studies: No results found.      Scheduled Meds: . aspirin EC  81 mg Oral Daily  . budesonide (PULMICORT) nebulizer solution  0.25 mg Nebulization BID  . buPROPion  150 mg Oral Daily  . calcium-vitamin D  1 tablet Oral Q lunch  . clopidogrel  75 mg Oral Daily  . doxycycline  100 mg Oral Q12H  . feeding supplement (ENSURE ENLIVE)  237 mL Oral BID BM  . furosemide  20 mg Intravenous BID  . insulin aspart  0-20 Units Subcutaneous TID WC  . insulin aspart  7 Units Subcutaneous TID WC  . insulin glargine  30 Units Subcutaneous QHS  . ipratropium-albuterol  3 mL Nebulization Q6H  . metoprolol tartrate  12.5 mg Oral BID  . multivitamin with minerals  1 tablet Oral Daily  . omega-3 acid ethyl esters  1 g Oral Daily  . pantoprazole (PROTONIX) IV  40 mg Intravenous Q12H  . pravastatin  40 mg Oral q1800  . predniSONE  40 mg Oral Q breakfast  . sodium chloride flush  3 mL Intravenous Q12H   Continuous Infusions: . sodium chloride    . ceFEPime (MAXIPIME) IV 2 g (02/19/20 0844)     Time spent: 30 minutes with over 50% of the time coordinating the patient's care    Jae Dire, DO [facility] Pager [phone]  Call night coverage person covering after 7pm

## 2020-02-19 NOTE — Care Management Important Message (Signed)
Important Message  Patient Details  Name: Hannah Conway MRN: [id number] Date of Birth: 1929/07/09   Medicare Important Message Given:  Yes     [name] 02/19/2020, 12:54 PM

## 2020-02-19 NOTE — Plan of Care (Signed)
Less anxiety this shift.  Dyspnea/wheezing continues with exertion.

## 2020-02-19 NOTE — Plan of Care (Signed)
  Problem: Health Behavior/Discharge Planning: Goal: Ability to manage health-related needs will improve Outcome: Progressing   

## 2020-02-20 DIAGNOSIS — Z7189 Other specified counseling: Secondary | ICD-10-CM

## 2020-02-20 LAB — CBC
HCT: 27.4 % — ABNORMAL LOW (ref 36.0–46.0)
Hemoglobin: 8.7 g/dL — ABNORMAL LOW (ref 12.0–15.0)
MCH: 25.4 pg — ABNORMAL LOW (ref 26.0–34.0)
MCHC: 31.8 g/dL (ref 30.0–36.0)
MCV: 79.9 fL — ABNORMAL LOW (ref 80.0–100.0)
Platelets: 399 10*3/uL (ref 150–400)
RBC: 3.43 MIL/uL — ABNORMAL LOW (ref 3.87–5.11)
RDW: 18.3 % — ABNORMAL HIGH (ref 11.5–15.5)
WBC: 18.6 10*3/uL — ABNORMAL HIGH (ref 4.0–10.5)
nRBC: 0.3 % — ABNORMAL HIGH (ref 0.0–0.2)

## 2020-02-20 LAB — BASIC METABOLIC PANEL
Anion gap: 13 (ref 5–15)
BUN: 63 mg/dL — ABNORMAL HIGH (ref 8–23)
CO2: 28 mmol/L (ref 22–32)
Calcium: 9.4 mg/dL (ref 8.9–10.3)
Chloride: 94 mmol/L — ABNORMAL LOW (ref 98–111)
Creatinine, Ser: 1.57 mg/dL — ABNORMAL HIGH (ref 0.44–1.00)
GFR calc Af Amer: 34 mL/min — ABNORMAL LOW (ref >60)
GFR calc non Af Amer: 29 mL/min — ABNORMAL LOW (ref >60)
Glucose, Bld: 145 mg/dL — ABNORMAL HIGH (ref 70–99)
Potassium: 4.7 mmol/L (ref 3.5–5.1)
Sodium: 135 mmol/L (ref 135–145)

## 2020-02-20 LAB — BRAIN NATRIURETIC PEPTIDE: B Natriuretic Peptide: 1613.4 pg/mL — ABNORMAL HIGH (ref 0.0–100.0)

## 2020-02-20 LAB — GLUCOSE, CAPILLARY
Glucose-Capillary: 137 mg/dL — ABNORMAL HIGH (ref 70–99)
Glucose-Capillary: 164 mg/dL — ABNORMAL HIGH (ref 70–99)
Glucose-Capillary: 304 mg/dL — ABNORMAL HIGH (ref 70–99)
Glucose-Capillary: 331 mg/dL — ABNORMAL HIGH (ref 70–99)

## 2020-02-20 LAB — MAGNESIUM: Magnesium: 2 mg/dL (ref 1.7–2.4)

## 2020-02-20 MED ORDER — FUROSEMIDE 10 MG/ML IJ SOLN
40.0000 mg | Freq: Two times a day (BID) | INTRAMUSCULAR | Status: DC
  Administered 2020-02-20 – 2020-02-22 (×4): 40 mg via INTRAVENOUS
  Filled 2020-02-20 (×4): qty 4

## 2020-02-20 MED ORDER — IPRATROPIUM-ALBUTEROL 0.5-2.5 (3) MG/3ML IN SOLN
3.0000 mL | Freq: Three times a day (TID) | RESPIRATORY_TRACT | Status: DC
  Administered 2020-02-21 – 2020-02-22 (×3): 3 mL via RESPIRATORY_TRACT
  Filled 2020-02-20 (×4): qty 3

## 2020-02-20 MED ORDER — IPRATROPIUM-ALBUTEROL 0.5-2.5 (3) MG/3ML IN SOLN
3.0000 mL | Freq: Four times a day (QID) | RESPIRATORY_TRACT | Status: DC
  Administered 2020-02-20: 3 mL via RESPIRATORY_TRACT
  Filled 2020-02-20 (×2): qty 3

## 2020-02-20 MED ORDER — ALPRAZOLAM 0.5 MG PO TABS
0.5000 mg | ORAL_TABLET | Freq: Every day | ORAL | Status: DC
  Administered 2020-02-20 – 2020-02-21 (×2): 0.5 mg via ORAL
  Filled 2020-02-20 (×2): qty 1

## 2020-02-20 NOTE — Plan of Care (Signed)
  Problem: Education: Goal: Knowledge of General Education information will improve Description: Including pain rating scale, medication(s)/side effects and non-pharmacologic comfort measures Outcome: Progressing   Problem: Clinical Measurements: Goal: Respiratory complications will improve Outcome: Progressing   Problem: Safety: Goal: Ability to remain free from injury will improve Outcome: Progressing   

## 2020-02-20 NOTE — Progress Notes (Signed)
PROGRESS NOTE    Hannah Conway    Code Status: Full Code  PJK:[id number] DOB: February 10, 1930 DOA: 02/13/2020 LOS: 7 days  PCP: Mickey Farber, MD CC:  Chief Complaint  Patient presents with  . Respiratory Distress       Hospital Summary   This is an 86 year old female with a past medical history of diabetes, hypertension, PVD who presented to the ED on 8/17 with acutely worsening dyspnea and respiratory distress x4 days which had worsened overnight prior to admission.  Also had dry cough, nausea without vomiting.  Denied any chest pain or palpitations  Upon presentation to the emergency room, blood pressure was 149/53 and pulse 70 was 90% on CPAP with otherwise normal vital signs.  Labs revealed VBG with pH 7.42 and bicarbonate of 25.9.  CMP was unremarkable.  BNP was 1085.6.  High-sensitivity troponin I was 2180 and later 1848 with procalcitonin less than 0.1.  CBC showed leukocytosis of 14.5 with anemia with hemoglobin of 8 hematocrit 26.1.  Chest x-ray showed diffuse interstitial opacity worse in the lower lobes likely indicating pulmonary edema., EKG showed sinus rhythm with rate of 85 with PACs, incomplete right bundle branch block and prolonged QT interval with QTC of 503 MS. The patient was given aspirin as well as 20 mg of IV Lasix, IV heparin bolus and drip.    She was admitted to a progressive bed and cardiology was consulted.  An echo was obtained and showed EF 45 to 50% moderate LVH, grade 2 diastolic dysfunction and severe hypokinesis of left ventricle and entire anterolateral wall and inferior lateral wall as well as moderate MR and severe AS and moderate aortic regurgitation.  8/18: Hb 6.7 -> 7.4 on repeat.  Given 1 unit PRBC transfusion in setting of NSTEMI for goal Hb > 8. Dark Stool -> GI consulted 8/20: COPD exacerbation with wheezing and increased work of breathing. CXR concerning for both pulmonary edema now developing consolidation. Started on Cefepime, steroids and  bronchodilators 8/21: Respiratory distress/volume overloaded. Increased Lasix. Doxycycline and pulmicort added. 8/22: Somewhat improved respiratory status but increased Cr. Decreased lasix per cardio. Nephrology on board for hyponatremia and aki. Added on pulmicort and doxycycline. 823: Pulmonology consulted given persistently poor respiratory status despite medical management without many changes. Palliative consulted.    A & P   Principal Problem:   Acute hypoxemic respiratory failure (HCC) Active Problems:   Hypertension associated with type 2 diabetes mellitus (HCC)   Type 2 diabetes mellitus with stage 3 chronic kidney disease, with long-term current use of insulin (HCC)   NSTEMI (non-ST elevated myocardial infarction) (HCC)   COPD with acute exacerbation (HCC)   1. NSTEMI a. Echo with wall motion and valvular abnormalities as above b. Continue aspirin, plavix, beta blocker, pravastatin. Heparin drip discontinued c. Continue telemetry d. Cardio: not a candidate for cath at this time and consider when more stable e. Palliative consulted   2. Acute on chronic iron deficiency anemia while on heparin, concern for chronic GI bleed s/p 1 u PRBC and IV Iron stable a. 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 GI consulted: Likely chronic.  Given her NSTEMI, heart failure and respiratory issues she is at high risk for anesthesia and endoscopic evaluation especially since she is not acutely bleeding.  Follow up with GI in the office 3-4 weeks after discharge for endoscopic procedures  3. Acute hypoxic respiratory failure, Multifactorial: HFpEF exacerbation, severe COPD exacerbation  Concern  for HAP  Morbid Obesity and suspected OSA tenuous status a. Continue bronchodilators, steroids (Day 5/5), Cefepime (Day 5/5), Doxycycline (Day 3/5)  and Pulmicort b. Hold antitussives as the patient had altered mental status with these meds  and has a prolonged QTc so Tessalon pearls are contraindicated c. Incentive spirometry d. Diuresis per Cardio and nephro e. Pulmonary consult since she has not been improving: morphine as needed, added on CPAP f. Palliative consulted  4. Diastolic heart failure exacerbation a. Echo: EF 45 to 50% moderate LVH, grade 2 diastolic dysfunction and severe hypokinesis of left ventricle and entire anterolateral wall and inferior lateral wall as well as moderate MR and severe AS and moderate aortic regurgitation b. -2.4L, Wt up to 71 kg today above 67 kg at presentation c. Discontinued ACE due to AKI d. Diuresis per Cardio and nephro e. Fluid restrict f. Continue heart failure protocol   5. Severe AS a. Does not appear to be a candidate for intervention given her comorbidities at this time.  6. AKI on CKD 3a, likely multifactorial: Cardiorenal, anemia, hemodynamic changes, ACE inhibitor/HCTZ  a. Cr 1.6->1.57, baseline about 0.8-1.0 b. Holding benzapril c. Nephrology consulted  7. New Onset Atrial Fibrillation a. Had atrial fibrillation HR 100-105 on Tele overnight b. Per cardio  8. Hyponatremia, unclear etiology possibly SIADH stable  a. Nephrology on board b. Fluid restriction  9. Leukocytosis, likely steroid induced and from pneumonia, stable  10. Hypertension stable a. Continue current management  11. Hyperlipidemia, Stable  12. Type 2 diabetes a. Completed 5 days steroids 8/24 b. Discontinue Novolog to 10 u TID with meals since she is done with the steroids and monitor for hypoglycemia c. Resistant sliding scale  d. Lantus 30 units daily  13. Depression a. Continue Wellbutrin XL  14. Asymptomatic UTI a. Completed cefepime for pneumonia as above   DVT prophylaxis: Heparin   Family Communication: Patient's family at bedside has been updated today. Goals of care discussion with the patient and daughter. Patient continues to request full code status  Disposition Plan:  needs clinical improvement prior to cardiac cath and dispo planning Status is: Inpatient  Remains inpatient appropriate because:Unsafe d/c plan, IV treatments appropriate due to intensity of illness or inability to take PO and Inpatient level of care appropriate due to severity of illness   Dispo: The patient is from: Home              Anticipated d/c is to: TBD              Anticipated d/c date is: > 3 days              Patient currently is not medically stable to d/c.          Pressure injury documentation    None  Consultants  Cardiology  GI Nephrology Pulmonology Palliative  Procedures  None  Antibiotics   Anti-infectives (From admission, onward)   Start     Dose/Rate Route Frequency Ordered Stop   02/18/20 1000  doxycycline (VIBRA-TABS) tablet 100 mg        100 mg Oral Every 12 hours 02/18/20 0936 02/23/20 0959   02/16/20 1500  ceFEPIme (MAXIPIME) 2 g in sodium chloride 0.9 % 100 mL IVPB        2 g 200 mL/hr over 30 Minutes Intravenous Daily 02/16/20 1428 02/20/20 1050        Subjective   States "Im not going to make it." She mentioned she is getting tired  and still having a hard time breathing. We discussed her goals of care in an emergency. She says she is not afraid to die but wants to try to live and wants CPR and intubation if needed and has capacity to make this decision. Daughter at bedside seemed reluctant to continue with full code but allowed for the patient's wishes. They are looking forward to speaking with palliative care.   Overnight the RN noted that telemetry had an episode of atrial fibrillation with soft Bps, currently rate controlled and normotensive. She states she did not have chest pain at that time. Currently without chest pain or any other complaints other than persistent shortness of breath.   Objective   Vitals:   02/20/20 0518 02/20/20 0745 02/20/20 0809 02/20/20 1133  BP: 116/65 129/60  (!) 142/75  Pulse: (!) 104 97 (!) 102 93    Resp: 20 18 20 18   Temp:  98.4 F (36.9 C)  98 F (36.7 C)  TempSrc:  Oral    SpO2: 98% 97% 98% 100%  Weight:      Height:        Intake/Output Summary (Last 24 hours) at 02/20/2020 1223 Last data filed at 02/20/2020 1021 Gross per 24 hour  Intake 943 ml  Output 1325 ml  Net -382 ml   Filed Weights   02/18/20 0324 02/19/20 0613 02/20/20 0230  Weight: 71.4 kg 71.6 kg 71 kg    Examination:  Physical Exam Vitals and nursing note reviewed. Exam conducted with a chaperone present.  Constitutional:      Comments: Still with increased work of breathing.  HENT:     Head: Normocephalic and atraumatic.  Eyes:     Conjunctiva/sclera: Conjunctivae normal.  Cardiovascular:     Rate and Rhythm: Normal rate and regular rhythm.     Heart sounds: Murmur heard.   Pulmonary:     Comments: End expiratory wheezes Pursed lip breathing Conversational dyspnea Abdominal:     General: Abdomen is flat.     Palpations: Abdomen is soft.  Musculoskeletal:        General: No swelling or tenderness.  Neurological:     Mental Status: She is alert. Mental status is at baseline.  Psychiatric:        Mood and Affect: Mood normal.        Behavior: Behavior normal.     Data Reviewed: I have personally reviewed following labs and imaging studies  CBC: Recent Labs  Lab 02/14/20 0518 02/14/20 0707 02/15/20 0411 02/15/20 0411 02/16/20 0517 02/17/20 0705 02/18/20 0612 02/19/20 0858 02/20/20 0411  WBC 19.1*   < > 15.7*   < > 16.2* 16.9* 20.3* 19.2* 18.6*  NEUTROABS 11.4*  --  9.4*  --  11.1*  --   --   --   --   HGB 6.7*   < > 8.5*   < > 8.5* 8.1* 8.7* 8.9* 8.7*  HCT 22.3*   < > 28.1*   < > 26.2* 25.8* 26.8* 28.6* 27.4*  MCV 80.2   < > 81.2   < > 76.8* 79.6* 77.7* 81.0 79.9*  PLT 417*   < > 409*   < > 393 403* 411* 381 399   < > = values in this interval not displayed.   Basic Metabolic Panel: Recent Labs  Lab 02/15/20 0411 02/15/20 0411 02/16/20 0517 02/17/20 0705 02/18/20 0612  02/19/20 0744 02/20/20 0411  NA 129*   < > 126* 130* 131* 131* 135  K  4.3   < > 4.0 3.4* 4.4 4.6 4.7  CL 91*   < > 87* 89* 92* 92* 94*  CO2 27   < > 28 28 26 26 28   GLUCOSE 175*   < > 160* 139* 125* 202* 145*  BUN 48*   < > 47* 43* 47* 60* 63*  CREATININE 1.52*   < > 1.30* 1.29* 1.46* 1.60* 1.57*  CALCIUM 9.3   < > 8.9 9.1 9.3 9.4 9.4  MG 1.9  --  1.9 2.2  --   --  2.0   < > = values in this interval not displayed.   GFR: Estimated Creatinine Clearance: 22.9 mL/min (A) (by C-G formula based on SCr of 1.57 mg/dL (H)). Liver Function Tests: No results for input(s): AST, ALT, ALKPHOS, BILITOT, PROT, ALBUMIN in the last 168 hours. No results for input(s): LIPASE, AMYLASE in the last 168 hours. No results for input(s): AMMONIA in the last 168 hours. Coagulation Profile: No results for input(s): INR, PROTIME in the last 168 hours. Cardiac Enzymes: No results for input(s): CKTOTAL, CKMB, CKMBINDEX, TROPONINI in the last 168 hours. BNP (last 3 results) No results for input(s): PROBNP in the last 8760 hours. HbA1C: No results for input(s): HGBA1C in the last 72 hours. CBG: Recent Labs  Lab 02/19/20 1146 02/19/20 1710 02/19/20 1954 02/20/20 0746 02/20/20 1132  GLUCAP 225* 304* 304* 137* 164*   Lipid Profile: No results for input(s): CHOL, HDL, LDLCALC, TRIG, CHOLHDL, LDLDIRECT in the last 72 hours. Thyroid Function Tests: No results for input(s): TSH, T4TOTAL, FREET4, T3FREE, THYROIDAB in the last 72 hours. Anemia Panel: No results for input(s): VITAMINB12, FOLATE, FERRITIN, TIBC, IRON, RETICCTPCT in the last 72 hours. Sepsis Labs: No results for input(s): PROCALCITON, LATICACIDVEN in the last 168 hours.  Recent Results (from the past 240 hour(s))  SARS Coronavirus 2 by RT PCR (hospital order, performed in [facility]) Nasopharyngeal Nasopharyngeal Swab     Status: None   Collection Time: 02/13/20  1:37 AM   Specimen: Nasopharyngeal Swab  Result Value Ref Range  Status   SARS Coronavirus 2 NEGATIVE NEGATIVE Final    Comment: (NOTE) SARS-CoV-2 target nucleic acids are NOT DETECTED.  The SARS-CoV-2 RNA is generally detectable in upper and lower respiratory specimens during the acute phase of infection. The lowest concentration of SARS-CoV-2 viral copies this assay can detect is 250 copies / mL. A negative result does not preclude SARS-CoV-2 infection and should not be used as the sole basis for treatment or other patient management decisions.  A negative result may occur with improper specimen collection / handling, submission of specimen other than nasopharyngeal swab, presence of viral mutation(s) within the areas targeted by this assay, and inadequate number of viral copies (<250 copies / mL). A negative result must be combined with clinical observations, patient history, and epidemiological information.  Fact Sheet for Patients:   BoilerBrush.com.cyhttps://www.fda.gov/media/136312/download  Fact Sheet for Healthcare Providers: https://pope.com/https://www.fda.gov/media/136313/download  This test is not yet approved or  cleared by the [place] FDA and has been authorized for detection and/or diagnosis of SARS-CoV-2 by FDA under an Emergency Use Authorization (EUA).  This EUA will remain in effect (meaning this test can be used) for the duration of the COVID-19 declaration under Section 564(b)(1) of the Act, 21 U.S.C. section 360bbb-3(b)(1), unless the authorization is terminated or revoked sooner.  Performed at [facility], [address] [place], [place] [address]   MRSA PCR Screening  Status: None   Collection Time: 02/16/20  4:53 PM   Specimen: Nasopharyngeal  Result Value Ref Range Status   MRSA by PCR NEGATIVE NEGATIVE Final    Comment:        The GeneXpert MRSA Assay (FDA approved for NASAL specimens only), is one component of a comprehensive MRSA colonization surveillance program. It is not intended to diagnose MRSA infection nor to  guide or monitor treatment for MRSA infections. Performed at [facility], [address] [place], [place] [address]   Urine Culture     Status: Abnormal   Collection Time: 02/18/20  5:48 PM   Specimen: Urine, Random  Result Value Ref Range Status   Specimen Description   Final    URINE, RANDOM Performed at [facility], [address], [place], [place] [address]    Special Requests   Final    NONE Performed at [facility], [address] [place], [place] [address]    Culture MULTIPLE SPECIES PRESENT, SUGGEST RECOLLECTION (A)  Final   Report Status 02/19/2020 FINAL  Final         Radiology Studies: No results found.      Scheduled Meds: . aspirin EC  81 mg Oral Daily  . budesonide (PULMICORT) nebulizer solution  0.5 mg Nebulization BID  . buPROPion  150 mg Oral Daily  . calcium-vitamin D  1 tablet Oral Q lunch  . clopidogrel  75 mg Oral Daily  . doxycycline  100 mg Oral Q12H  . feeding supplement (ENSURE ENLIVE)  237 mL Oral BID BM  . furosemide  40 mg Intravenous BID  . insulin aspart  0-20 Units Subcutaneous TID WC  . insulin aspart  10 Units Subcutaneous TID WC  . insulin glargine  30 Units Subcutaneous QHS  . ipratropium-albuterol  3 mL Nebulization Q6H  . metoprolol tartrate  12.5 mg Oral BID  . multivitamin with minerals  1 tablet Oral Daily  . omega-3 acid ethyl esters  1 g Oral Daily  . pantoprazole (PROTONIX) IV  40 mg Intravenous Q12H  . pravastatin  40 mg Oral q1800  . sodium chloride flush  3 mL Intravenous Q12H   Continuous Infusions: . sodium chloride       Time spent: 45 minutes with over 50% of the time coordinating the patient's care    Jae Dire, DO [facility] Pager [phone]  Call night coverage person covering after 7pm

## 2020-02-20 NOTE — Progress Notes (Signed)
Palliative-   Chart reviewed. Attempted to see patient- she was in distress and was with respiratory therapy.  Called patient's daughter, Ginnie Smart- left message requesting return call.   Ocie Bob, AGNP-C Palliative Medicine  Please call Palliative Medicine team phone with any questions [phone]. For individual providers please see AMION.  No charge

## 2020-02-20 NOTE — Progress Notes (Signed)
   02/20/20 0230  Assess: MEWS Score  Temp 98.1 F (36.7 C)  BP 99/68  Pulse Rate (!) 107  Resp 15  SpO2 97 %  O2 Device Nasal Cannula  O2 Flow Rate (L/min) 4 L/min  Assess: MEWS Score  MEWS Temp 0  MEWS Systolic 1  MEWS Pulse 1  MEWS RR 0  MEWS LOC 0  MEWS Score 2  MEWS Score Color Yellow  Assess: if the MEWS score is Yellow or Red  Were vital signs taken at a resting state? No  Focused Assessment No change from prior assessment  Early Detection of Sepsis Score *See Row Information* High  MEWS guidelines implemented *See Row Information* No, vital signs rechecked

## 2020-02-20 NOTE — Consult Note (Signed)
Name: Hannah Conway MRN: [id number] DOB: 1929-07-24     CONSULTATION DATE: 02/20/2020  REFERRING MD : Anthoney Harada  CHIEF COMPLAINT: SOB   HISTORY OF PRESENT ILLNESS: SOB is less trried full face mask CPAP last night and hated it Progressive CHF  Long conversation with patient and daughter at bedside  We talked about CODE status I have suggested that she b made DNR/DNI  We talked with Hemodialysis-patient DOES NOT WANT HEMODIALYSIS IF IT COMES DOWN TO IT  Daughter was in agreement with Home with Hospice I encouraged the patient to make herself  DNR/DNI and accept facts that her CHF has taken over and she will not be the same as before.  Palliative care team to see patient.     Prior to Admission medications   Medication Sig Start Date End Date Taking? Authorizing Provider  acetaminophen (TYLENOL) 500 MG tablet Take 1,000 mg by mouth every 6 (six) hours as needed for moderate pain.    Yes [provider]  aspirin EC 81 MG tablet Take 81 mg by mouth daily.    Yes [provider]  benazepril-hydrochlorthiazide (LOTENSIN HCT) 20-12.5 MG tablet Take 1 tablet by mouth daily. 11/27/17  Yes [provider]  buPROPion (WELLBUTRIN XL) 150 MG 24 hr tablet Take 150 mg by mouth daily.   Yes [provider]  Calcium Citrate-Vitamin D (CALCIUM CITRATE + D3 PO) Take 2 tablets by mouth daily with lunch.   Yes [provider]  Cinnamon 500 MG TABS Take 500 mg by mouth daily.   Yes [provider]  clopidogrel (PLAVIX) 75 MG tablet Take 1 tablet by mouth once daily with breakfast Patient taking differently: Take 75 mg by mouth daily.  02/07/20  Yes Georgiana Spinner, NP  Multiple Vitamin (MULTIVITAMIN WITH MINERALS) TABS tablet Take 1 tablet by mouth daily. Complete Multivitamin   Yes [provider]  NOVOLOG MIX 70/30 FLEXPEN (70-30) 100 UNIT/ML FlexPen Inject 35-55 Units into the skin See admin instructions. Inject 55 units subcutaneously  in the morning and 35 in the evening 02/01/18  Yes [provider]  Omega-3 Fatty Acids (FISH OIL) 1000 MG CAPS Take 1,000 mg by mouth 2 (two) times daily.   Yes [provider]  pravastatin (PRAVACHOL) 40 MG tablet Take 40 mg by mouth daily. 01/23/20  Yes [provider]  oxyCODONE (OXY IR/ROXICODONE) 5 MG immediate release tablet Take 1 tablet (5 mg total) by mouth every 6 (six) hours as needed for moderate pain or severe pain. Patient not taking: Reported on 01/05/2019 08/10/18   Annice Needy, MD      Review of Systems:  Gen:  Denies  fever, sweats, chills weight loss  HEENT: Denies blurred vision, double vision, ear pain, eye pain, hearing loss, nose bleeds, sore throat Cardiac:  No dizziness, chest pain or heaviness, chest tightness,edema, No JVD Resp:   +SOB Other:  All other systems negative     VITAL SIGNS: Temp:  [97.8 F (36.6 C)-98.6 F (37 C)] 98 F (36.7 C) (08/24 1133) Pulse Rate:  [92-107] 92 (08/24 1227) Resp:  [15-22] 22 (08/24 1227) BP: (89-142)/(55-75) 142/75 (08/24 1133) SpO2:  [95 %-100 %] 99 % (08/24 1227) Weight:  [71 kg] 71 kg (08/24 0230)     SpO2: 99 % O2 Flow Rate (L/min): 4 L/min   Physical Examination:   General Appearance: MILD  distress  Neuro:without focal findings,  speech normal,  HEENT: PERRLA, EOM intact.   Pulmonary: normal  breath sounds, +rhonchsic and crackles.  CardiovascularNormal S1,S2.  No m/r/g.   Abdomen: Benign, Soft, non-tender. Renal:  No costovertebral tenderness  GU:  Not performed at this time. Endoc: No evident thyromegaly Skin:   warm, no rashes, no ecchymosis  Extremities: normal, no cyanosis, clubbing. PSYCHIATRIC: Mood, affect within normal limits.   ALL OTHER ROS ARE NEGATIVE      CULTURE RESULTS   Recent Results (from the past 240 hour(s))  SARS Coronavirus 2 by RT PCR (hospital order, performed in [facility] hospital lab) Nasopharyngeal Nasopharyngeal Swab     Status: None    Collection Time: 02/13/20  1:37 AM   Specimen: Nasopharyngeal Swab  Result Value Ref Range Status   SARS Coronavirus 2 NEGATIVE NEGATIVE Final    Comment: (NOTE) SARS-CoV-2 target nucleic acids are NOT DETECTED.  The SARS-CoV-2 RNA is generally detectable in upper and lower respiratory specimens during the acute phase of infection. The lowest concentration of SARS-CoV-2 viral copies this assay can detect is 250 copies / mL. A negative result does not preclude SARS-CoV-2 infection and should not be used as the sole basis for treatment or other patient management decisions.  A negative result may occur with improper specimen collection / handling, submission of specimen other than nasopharyngeal swab, presence of viral mutation(s) within the areas targeted by this assay, and inadequate number of viral copies (<250 copies / mL). A negative result must be combined with clinical observations, patient history, and epidemiological information.  Fact Sheet for Patients:   BoilerBrush.com.cy  Fact Sheet for Healthcare Providers: https://pope.com/  This test is not yet approved or  cleared by the [place] FDA and has been authorized for detection and/or diagnosis of SARS-CoV-2 by FDA under an Emergency Use Authorization (EUA).  This EUA will remain in effect (meaning this test can be used) for the duration of the COVID-19 declaration under Section 564(b)(1) of the Act, 21 U.S.C. section 360bbb-3(b)(1), unless the authorization is terminated or revoked sooner.  Performed at [facility], [address] [place], [place] [address]   MRSA PCR Screening     Status: None   Collection Time: 02/16/20  4:53 PM   Specimen: Nasopharyngeal  Result Value Ref Range Status   MRSA by PCR NEGATIVE NEGATIVE Final    Comment:        The GeneXpert MRSA Assay (FDA approved for NASAL specimens only), is one component of a comprehensive MRSA  colonization surveillance program. It is not intended to diagnose MRSA infection nor to guide or monitor treatment for MRSA infections. Performed at [facility], [address] [place], [place] [address]   Urine Culture     Status: Abnormal   Collection Time: 02/18/20  5:48 PM   Specimen: Urine, Random  Result Value Ref Range Status   Specimen Description   Final    URINE, RANDOM Performed at [facility], [address], [place], [place] [address]    Special Requests   Final    NONE Performed at [facility], [address] [place], [place] [address]    Culture MULTIPLE SPECIES PRESENT, SUGGEST RECOLLECTION (A)  Final   Report Status 02/19/2020 FINAL  Final         ASSESSMENT AND PLAN SYNOPSIS  86 yo female with increased WOB likely from acute sCHF exacerbation due to NSTEMI with acute COPD exacerbation with probable underlying COPD  SEVERE COPD EXACERBATION -continue IV steroids as prescribed -continue NEB THERAPY as prescribed  ACUTE SYSTOLIC  CARDIAC FAILURE- NSTEMI -oxygen as needed -Lasix as tolerated -follow up cardiac enzymes as indicated -follow up cardiology recs    ACUTE KIDNEY INJURY/Renal Failure -continue Foley Catheter-assess need -Avoid nephrotoxic agents -Follow urine output, BMP -Ensure adequate renal perfusion, optimize oxygenation -Renal dose medications PATIENT DOES NOT WAN HEMODYALYSIS   Morbid obesity, possible OSA.   Will certainly impact respiratory mechanics Will try autoCPAP tonight with NASAL MASK   ELECTROLYTES -follow labs as needed -replace as needed   Patient/Family are satisfied with Plan of action and management. All questions answered  Follow Up palliative are consultation  Brunetta Newingham Santiago Glad, M.D.  [facility]  Medical Director [facility] Medical Director [facility] Cardio-Pulmonary Department

## 2020-02-20 NOTE — Progress Notes (Signed)
Occupational Therapy Treatment Patient Details Name: Hannah Conway MRN: [id number] DOB: 1930-01-18 Today's Date: 02/20/2020    History of present illness Hannah Conway is an 86 y/o female with complaints of worsening dyspnea x 4 days and was admitted for NSTEMI. PMH includes DM, HTN, and PAD.   OT comments  Hannah Conway was seen for OT treatment on this date. Upon arrival to room pt awake/alert, semi-supine in bed, on 5L [place], with daughter present at bedside. Pt endorsing that she has "had a tough day", but is agreeable to OT tx session. Pt/daughter very receptive to energy conservation education. OT educates pt on use of pursed lip breathing, activity pacing, and prioritizing meaningful occupations to support pt safety and functional independence upon hospital DC. Pt return demonstrates understanding of PLB technique during functional mobility/transfer training. Her vitals are monitored t/o the session and ANS remains stable with spO2 remaining >95% with activity and HR reaching 120's after sup<>sit t/f but returning to 80's-90's with therapeutic rest break. Pt begins coughing frequently with activity and is unable to attempt further functional activity. OT assists with return to bed and educates pt on positioning to maximize breathing/energy conservation. Pt making good progress toward goals and continues to benefit from skilled OT services to maximize return to PLOF and minimize risk of future falls, injury, caregiver burden, and readmission. Will continue to follow POC as written. Discharge recommendation remains appropriate.    Follow Up Recommendations  SNF    Equipment Recommendations  3 in 1 bedside commode    Recommendations for Other Services      Precautions / Restrictions Precautions Precautions: Fall Precaution Comments: high fall risk Restrictions Weight Bearing Restrictions: No       Mobility Bed Mobility Overal bed mobility: Needs Assistance Bed Mobility: Supine to Sit      Supine to sit: Min assist     General bed mobility comments: MIN A for trunk elevation. Requires VCs for sequencing and hand placement.  Transfers Overall transfer level: Needs assistance Equipment used: Rolling walker (2 wheeled) Transfers: Sit to/from Stand Sit to Stand: Mod assist;Max assist         General transfer comment: MOD/MAX A for STS transfers x2 with consistent cueing for hand/foot placement. Pt performs 2nd STS attempt better with only MOD A. MOD A also required for side-stepping at EOB to re-position.    Balance Overall balance assessment: Needs assistance Sitting-balance support: Feet supported;Single extremity supported Sitting balance-[name]: Good Sitting balance - Comments: Pt maintains seated balance w/o physical assist. Generally maintains at least 1 UE supported on bed.   Standing balance support: Bilateral upper extremity supported;During functional activity Standing balance-[name]: Fair Standing balance comment: BUE support on RW with min A for steadying                           ADL either performed or assessed with clinical judgement   ADL Overall ADL's : Needs assistance/impaired                                     Functional mobility during ADLs: Moderate assistance;Rolling walker;Maximal assistance General ADL Comments: Pt continues to be functionally limited by SOB, generalized weakness, and fatigue with exertion. She requires MOD/MAX A to perform STS at EOB and MOD A to engage in side-stepping. OT provided education on energy conservation strategies to support safety and  functional independence with ADL management including activity pacing and pursed lip breathing.     Vision Baseline Vision/History: Wears glasses Patient Visual Report: No change from baseline     Perception     Praxis      Cognition Arousal/Alertness: Awake/alert Behavior During Therapy: WFL for tasks assessed/performed Overall  Cognitive Status: Within Functional Limits for tasks assessed                                          Exercises Other Exercises Other Exercises: Pt/caregiver (daughter at bedside) educated on energy conservation strategies to support safety and functional independence during ADL management including activity pacing, pursed lip breathing, and prioritizing meaningful occupations. Other Exercises: OT facilitates sup<>sit t/f, STS t/f x2, and side-stepping at EOB with vitals monitored t/o and education on ECS to support safety/functional indep.   Shoulder Instructions       General Comments Pt vitals monitored during session. Pt on 5L [place]. SPO2 remains WFLs >95% t/o session and HR noted to reach 120's with exertion, however, returns to 90's with seated therapeutic rest break.    Pertinent Vitals/ Pain       Pain Assessment: No/denies pain  Home Living                                          Prior Functioning/Environment              Frequency  Min 2X/week        Progress Toward Goals  OT Goals(current goals can now be found in the care plan section)  Progress towards OT goals: Progressing toward goals  Acute Rehab OT Goals Patient Stated Goal: wants to go home with [facility] OT Goal Formulation: With patient Time For Goal Achievement: 02/24/20  Plan Discharge plan remains appropriate;Frequency remains appropriate    Co-evaluation                 AM-PAC OT "6 Clicks" Daily Activity     Outcome Measure   Help from another person eating meals?: A Little Help from another person taking care of personal grooming?: A Little Help from another person toileting, which includes using toliet, bedpan, or urinal?: A Lot Help from another person bathing (including washing, rinsing, drying)?: A Lot Help from another person to put on and taking off regular upper body clothing?: A Lot Help from another person to put on and taking off regular lower  body clothing?: A Lot 6 Click Score: 14    End of Session Equipment Utilized During Treatment: Gait belt;Rolling walker  OT Visit Diagnosis: Muscle weakness (generalized) (M62.81);History of falling (Z91.81);Repeated falls (R29.6)   Activity Tolerance Patient tolerated treatment well   Patient Left in bed;with family/visitor present;with call bell/phone within reach;with bed alarm set   Nurse Communication          Time: 5277-8242 OT Time Calculation (min): 31 min  Charges: OT General Charges $OT Visit: 1 Visit OT Treatments $Self Care/Home Management : 8-22 mins $Therapeutic Activity: 8-22 mins  Rockney Ghee, M.S., OTR/L Ascom: [phone] 02/20/20, 3:51 PM

## 2020-02-20 NOTE — Progress Notes (Signed)
Patient Name: Hannah Conway Date of Encounter: 02/20/2020  Hospital Problem List     Principal Problem:   Acute hypoxemic respiratory failure (HCC) Active Problems:   Hypertension associated with type 2 diabetes mellitus (HCC)   Type 2 diabetes mellitus with stage 3 chronic kidney disease, with long-term current use of insulin (HCC)   NSTEMI (non-ST elevated myocardial infarction) (HCC)   COPD with acute exacerbation [facility])    Patient Profile     86 year old female with history of peripheral vascular disease, diabetes who was in preop in preparation for an angiogram of her lower extremities by vascular surgery. She became acutely short of breath which had started in retrospect 4 days earlier but worsened on presentation to the hospital. She was sent to the emergency room where she complained of a dry cough. EKG showed nondiagnostic changes for ischemia. She had elevated troponins to 2180 which have subsequently diminished. She was felt to have a non-ST elevation myocardial infarction. Echo revealed ejection fraction 45 to 50% with hypokinesis of the anterolateral wall. She had severe aortic stenosis with a mean gradient of 40 mmHg with a V-max of 3.98 m/s. Her hospital course was complicated by anemia requiring transfusion as well as increasing respiratory distress. Chest x-ray showed diffuse interstitial opacity in the lower lobes.  Subjective   Less sob  Inpatient Medications     aspirin EC  81 mg Oral Daily   budesonide (PULMICORT) nebulizer solution  0.5 mg Nebulization BID   buPROPion  150 mg Oral Daily   calcium-vitamin D  1 tablet Oral Q lunch   clopidogrel  75 mg Oral Daily   doxycycline  100 mg Oral Q12H   feeding supplement (ENSURE ENLIVE)  237 mL Oral BID BM   furosemide  20 mg Intravenous BID   insulin aspart  0-20 Units Subcutaneous TID WC   insulin aspart  10 Units Subcutaneous TID WC   insulin glargine  30 Units Subcutaneous QHS    ipratropium-albuterol  3 mL Nebulization Q6H   metoprolol tartrate  12.5 mg Oral BID   multivitamin with minerals  1 tablet Oral Daily   omega-3 acid ethyl esters  1 g Oral Daily   pantoprazole (PROTONIX) IV  40 mg Intravenous Q12H   pravastatin  40 mg Oral q1800   sodium chloride flush  3 mL Intravenous Q12H    Vital Signs    Vitals:   02/20/20 0313 02/20/20 0518 02/20/20 0745 02/20/20 0809  BP:  116/65 129/60   Pulse:  (!) 104 97 (!) 102  Resp:  20 18 20   Temp:   98.4 F (36.9 C)   TempSrc:   Oral   SpO2: 100% 98% 97% 98%  Weight:      Height:        Intake/Output Summary (Last 24 hours) at 02/20/2020 1127 Last data filed at 02/20/2020 1021 Gross per 24 hour  Intake 943 ml  Output 1325 ml  Net -382 ml   Filed Weights   02/18/20 0324 02/19/20 0613 02/20/20 0230  Weight: 71.4 kg 71.6 kg 71 kg    Physical Exam    GEN: Well nourished, well developed, in no acute distress.  HEENT: normal.  Neck: Supple, no JVD, carotid bruits, or masses. Cardiac: RRR, no murmurs, rubs, or gallops. No clubbing, cyanosis, edema.  Radials/DP/PT 2+ and equal bilaterally.  Respiratory:  Respirations regular and unlabored, clear to auscultation bilaterally. GI: Soft, nontender, nondistended, BS + x 4. MS: no deformity or atrophy.  Skin: warm and dry, no rash. Neuro:  Strength and sensation are intact. Psych: Normal affect.  Labs    CBC Recent Labs    02/19/20 0858 02/20/20 0411  WBC 19.2* 18.6*  HGB 8.9* 8.7*  HCT 28.6* 27.4*  MCV 81.0 79.9*  PLT 381 399   Basic Metabolic Panel Recent Labs    04/54/09 0744 02/20/20 0411  NA 131* 135  K 4.6 4.7  CL 92* 94*  CO2 26 28  GLUCOSE 202* 145*  BUN 60* 63*  CREATININE 1.60* 1.57*  CALCIUM 9.4 9.4  MG  --  2.0   Liver Function Tests No results for input(s): AST, ALT, ALKPHOS, BILITOT, PROT, ALBUMIN in the last 72 hours. No results for input(s): LIPASE, AMYLASE in the last 72 hours. Cardiac Enzymes No results for  input(s): CKTOTAL, CKMB, CKMBINDEX, TROPONINI in the last 72 hours. BNP No results for input(s): BNP in the last 72 hours. D-Dimer No results for input(s): DDIMER in the last 72 hours. Hemoglobin A1C No results for input(s): HGBA1C in the last 72 hours. Fasting Lipid Panel No results for input(s): CHOL, HDL, LDLCALC, TRIG, CHOLHDL, LDLDIRECT in the last 72 hours. Thyroid Function Tests No results for input(s): TSH, T4TOTAL, T3FREE, THYROIDAB in the last 72 hours.  Invalid input(s): FREET3  Telemetry    nsr  ECG    nsr  Radiology    DG Chest Port 1 View  Result Date: 02/17/2020 CLINICAL DATA:  Hypoxia, current smoker EXAM: PORTABLE CHEST 1 VIEW COMPARISON:  Chest radiograph from one day prior. FINDINGS: Stable cardiomediastinal silhouette with mild cardiomegaly. No pneumothorax. Small stable right pleural effusion. No left pleural effusion. Diffuse prominence of the parahilar interstitial markings, stable. Patchy opacities at both lung bases, stable. IMPRESSION: 1. Stable mild cardiomegaly with diffuse prominence of the parahilar interstitial markings, suggesting pulmonary edema. 2. Stable small right pleural effusion. 3. Stable patchy bibasilar lung opacities, favor atelectasis, cannot exclude a component of aspiration or pneumonia. Electronically Signed   By: Delbert Phenix M.D.   On: 02/17/2020 15:36   DG Chest Port 1 View  Result Date: 02/16/2020 CLINICAL DATA:  Cough and respiratory distress EXAM: PORTABLE CHEST 1 VIEW COMPARISON:  February 13, 2020 FINDINGS: There is consolidation in the right base region with right pleural effusion. There is cardiomegaly with pulmonary venous hypertension. There is diffuse interstitial edema. No adenopathy. There is aortic atherosclerosis. IMPRESSION: Cardiomegaly with pulmonary vascular congestion. Diffuse interstitial edema with right pleural effusion. Suspect congestive heart failure. Superimposed airspace opacity in the right lower lung region may  represent superimposed pneumonia. There may also be alveolar edema in this area. Both edema and pneumonia may present concurrently. Aortic Atherosclerosis (ICD10-I70.0). Electronically Signed   By: Bretta Bang III M.D.   On: 02/16/2020 10:36   DG Chest Portable 1 View  Result Date: 02/13/2020 CLINICAL DATA:  Dyspnea EXAM: PORTABLE CHEST 1 VIEW COMPARISON:  04/28/2013 FINDINGS: Diffuse interstitial opacity, worst in the lower lobes. No pneumothorax or sizable pleural effusion. IMPRESSION: Diffuse interstitial opacity, worst in the lower lobes, likely indicating pulmonary edema. Electronically Signed   By: Deatra Robinson M.D.   On: 02/13/2020 02:09   VAS [place] ABI WITH/WO TBI  Result Date: 02/01/2020 LOWER EXTREMITY DOPPLER STUDY Indications: Peripheral artery disease, and 08/09/2018              Bilat CIA PTA stents              Left EIA PTA stent  Right SFA to Pop PTA stent.  Vascular Interventions: 01/13/2019: PTA and Stent to the Right SFA and Popliteal                         Artery. Mechanical Thrombectomy of Occluded Rt SFA                         stents. Comparison Study: 01/30/2019 Performing Technologist: Debbe Bales RVS  Examination Guidelines: A complete evaluation includes at minimum, Doppler waveform signals and systolic blood pressure reading at the level of bilateral brachial, anterior tibial, and posterior tibial arteries, when vessel segments are accessible. Bilateral testing is considered an integral part of a complete examination. Photoelectric Plethysmograph (PPG) waveforms and toe systolic pressure readings are included as required and additional duplex testing as needed. Limited examinations for reoccurring indications may be performed as noted.  ABI Findings: +---------+------------------+-----+--------+--------+  Right     Rt Pressure (mmHg) Index Waveform Comment   +---------+------------------+-----+--------+--------+  Brachial  132                                          +---------+------------------+-----+--------+--------+  ATA       143                1.04  biphasic           +---------+------------------+-----+--------+--------+  PTA       130                0.94  biphasic           +---------+------------------+-----+--------+--------+  Great Toe 110                0.80  Normal             +---------+------------------+-----+--------+--------+ +---------+------------------+-----+--------+-------+  Left      Lt Pressure (mmHg) Index Waveform Comment  +---------+------------------+-----+--------+-------+  Brachial  138                                        +---------+------------------+-----+--------+-------+  ATA       99                 0.72  biphasic          +---------+------------------+-----+--------+-------+  PERO      103                0.75  biphasic          +---------+------------------+-----+--------+-------+  Great Toe 71                 0.51  Abnormal          +---------+------------------+-----+--------+-------+ +-------+-----------+-----------+------------+------------+  ABI/TBI Today's ABI Today's TBI Previous ABI Previous TBI  +-------+-----------+-----------+------------+------------+  Right   1.04        .80         1.04         .55           +-------+-----------+-----------+------------+------------+  Left    .75         .51         .73          .83           +-------+-----------+-----------+------------+------------+  Right ABIs appear essentially unchanged compared to prior study on 01/30/2019. Left ABIs appear essentially unchanged compared to prior study on 01/30/2019. Rt TBIs appear to be increased compared to prior study on 01/30/2019. Lt TBIs appear to be decreased compared to prior study on 01/30/2019.  Summary: Right: Resting right ankle-brachial index is within normal range. No evidence of significant right lower extremity arterial disease. The right toe-brachial index is normal. Left: Resting left ankle-brachial index indicates moderate left  lower extremity arterial disease. The left toe-brachial index is abnormal.  *See table(s) above for measurements and observations.  Electronically signed by Levora Dredge MD on 02/01/2020 at 11:46:14 AM.    Final    ECHOCARDIOGRAM COMPLETE  Result Date: 02/13/2020    ECHOCARDIOGRAM REPORT   Patient Name:   ALBANY WINSLOW Date of Exam: 02/13/2020 Medical Rec #:  [id number]     Height:       62.0 in Accession #:    [id number]    Weight:       149.0 lb Date of Birth:  Jul 17, 1929     BSA:          1.687 m Patient Age:    89 years      BP:           149/53 mmHg Patient Gender: F             HR:           81 bpm. Exam Location:  [facility] Procedure: Color Doppler, Cardiac Doppler, 2D Echo and Intracardiac            Opacification Agent Indications:     NSTEMI 121.4  History:         Patient has no prior history of Echocardiogram examinations.                  Risk Factors:Hypertension and Diabetes. PAD.  Sonographer:     Cristela Blue RDCS (AE) Referring Phys:  [id number] Vernetta Honey MANSY Diagnosing Phys: Cristal Deer End MD  Sonographer Comments: No subcostal window, suboptimal apical window and suboptimal parasternal window. Image acquisition challenging due to COPD. IMPRESSIONS  1. Left ventricular ejection fraction, by estimation, is 45 to 50%. The left ventricle has mildly decreased function. The left ventricle demonstrates regional wall motion abnormalities (see scoring diagram/findings for description). There is moderate left ventricular hypertrophy. Left ventricular diastolic parameters are consistent with Grade II diastolic dysfunction (pseudonormalization). Elevated left atrial pressure. There is severe hypokinesis of the left ventricular, entire anterolateral wall and inferolateral wall.  2. Right ventricular systolic function is normal. The right ventricular size is normal. Tricuspid regurgitation signal is inadequate for assessing PA pressure.  3. Left atrial size was mildly dilated.  4. The mitral valve is grossly normal.  Moderate mitral valve regurgitation. No evidence of mitral stenosis.  5. Unable to determine valve morphology due to image quality. Aortic valve regurgitation is moderate. Severe aortic valve stenosis. Aortic valve mean gradient measures 40.0 mmHg. Aortic valve Vmax measures 3.98 m/s.  6. Pulmonic valve regurgitation not well assessed. FINDINGS  Left Ventricle: Left ventricular ejection fraction, by estimation, is 45 to 50%. The left ventricle has mildly decreased function. The left ventricle demonstrates regional wall motion abnormalities. Severe hypokinesis of the left ventricular, entire anterolateral wall and inferolateral wall. Definity contrast agent was given IV to delineate the left ventricular endocardial borders. The left ventricular internal cavity size was normal in size. There is moderate left ventricular hypertrophy. Left ventricular diastolic parameters are  consistent with Grade II diastolic dysfunction (pseudonormalization). Elevated left atrial pressure. Right Ventricle: The right ventricular size is normal. Right vetricular wall thickness was not assessed. Right ventricular systolic function is normal. Tricuspid regurgitation signal is inadequate for assessing PA pressure. Left Atrium: Left atrial size was mildly dilated. Right Atrium: Right atrial size was normal in size. Pericardium: The pericardium was not well visualized. Mitral Valve: The mitral valve is grossly normal. Moderate mitral annular calcification. Moderate mitral valve regurgitation. No evidence of mitral valve stenosis. Tricuspid Valve: The tricuspid valve is not well visualized. Tricuspid valve regurgitation is trivial. Aortic Valve: Unable to determine valve morphology due to image quality.. There is severe thickening and mild calcification of the aortic valve. Aortic valve regurgitation is moderate. Severe aortic stenosis is present. There is severe thickening of the aortic valve. There is mild calcification of the aortic valve.  Aortic valve mean gradient measures 40.0 mmHg. Aortic valve peak gradient measures 63.4 mmHg. Aortic valve area, by VTI measures 0.65 cm. Pulmonic Valve: The pulmonic valve was not well visualized. Pulmonic valve regurgitation not well assessed. Aorta: The aortic root is normal in size and structure. Pulmonary Artery: The pulmonary artery is not well seen. Venous: The inferior vena cava was not well visualized. IAS/Shunts: The interatrial septum was not well visualized.  LEFT VENTRICLE PLAX 2D LVIDd:         3.50 cm      Diastology LVIDs:         2.47 cm      LV e' lateral:   3.92 cm/s LV PW:         1.22 cm      LV E/e' lateral: 37.0 LV IVS:        1.05 cm      LV e' medial:    4.57 cm/s LVOT diam:     2.00 cm      LV E/e' medial:  31.7 LV SV:         57 LV SV Index:   34 LVOT Area:     3.14 cm  LV Volumes (MOD) LV vol d, MOD A2C: 96.8 ml LV vol d, MOD A4C: 110.0 ml LV vol s, MOD A2C: 48.3 ml LV vol s, MOD A4C: 57.4 ml LV SV MOD A2C:     48.5 ml LV SV MOD A4C:     110.0 ml LV SV MOD BP:      50.5 ml RIGHT VENTRICLE RV Basal diam:  2.67 cm RV S prime:     13.60 cm/s TAPSE (M-mode): 2.5 cm LEFT ATRIUM             Index       RIGHT ATRIUM           Index LA diam:        4.40 cm 2.61 cm/m  RA Area:     13.20 cm LA Vol (A2C):   82.9 ml 49.14 ml/m RA Volume:   30.10 ml  17.84 ml/m LA Vol (A4C):   49.6 ml 29.40 ml/m LA Biplane Vol: 64.4 ml 38.17 ml/m  AORTIC VALVE                    PULMONIC VALVE AV Area (Vmax):    0.69 cm     RVOT Peak grad: 6 mmHg AV Area (Vmean):   0.60 cm AV Area (VTI):     0.65 cm AV Vmax:           398.00  cm/s AV Vmean:          283.333 cm/s AV VTI:            0.889 m AV Peak Grad:      63.4 mmHg AV Mean Grad:      40.0 mmHg LVOT Vmax:         87.50 cm/s LVOT Vmean:        53.800 cm/s LVOT VTI:          0.183 m LVOT/AV VTI ratio: 0.21  AORTA Ao Root diam: 2.80 cm MITRAL VALVE MV Area (PHT): 3.30 cm     SHUNTS MV Decel Time: 230 msec     Systemic VTI:  0.18 m MV E velocity: 145.00 cm/s   Systemic Diam: 2.00 cm MV A velocity: 119.00 cm/s MV E/A ratio:  1.22 Cristal Deerhristopher End MD Electronically signed by Yvonne Kendallhristopher End MD Signature Date/Time: 02/13/2020/10:47:14 AM    Final    VAS US LOWER EXTREMITY ARTERIAL DUPLEX  Result Date: 02/01/2020 LOWER EXTREMITY ARTERIAL DUPLEX STUDY Indications: Peripheral artery disease, and 08/09/2018              Bilat CIA PTA stents              Left EIA PTA stent              Right SFA to Pop PTA stent.  Vascular Interventions: 01/13/2019: PTA and Stent to the Right SFA and Popliteal                         Artery. Mechanical Thrombectomy of Occluded Rt SFA                         stents. Current ABI:            Rt 1.04, Lt .75 Comparison Study: 01/30/2019 Performing Technologist: Debbe BalesSolomon Mcclary RVS  Examination Guidelines: A complete evaluation includes B-mode imaging, spectral Doppler, color Doppler, and power Doppler as needed of all accessible portions of each vessel. Bilateral testing is considered an integral part of a complete examination. Limited examinations for reoccurring indications may be performed as noted.  +-----------+--------+-----+--------+--------+--------+  RIGHT       PSV cm/s Ratio Stenosis Waveform Comments  +-----------+--------+-----+--------+--------+--------+  CFA Distal  178                     biphasic           +-----------+--------+-----+--------+--------+--------+  DFA         213                     biphasic           +-----------+--------+-----+--------+--------+--------+  SFA Prox    303                     biphasic Stent     +-----------+--------+-----+--------+--------+--------+  SFA Mid     95                      biphasic stent     +-----------+--------+-----+--------+--------+--------+  SFA Distal  69                      biphasic           +-----------+--------+-----+--------+--------+--------+  POP Distal  102  biphasic           +-----------+--------+-----+--------+--------+--------+  ATA Distal  85                       biphasic           +-----------+--------+-----+--------+--------+--------+  PTA Distal  14                      biphasic           +-----------+--------+-----+--------+--------+--------+  PERO Distal 41                      biphasic           +-----------+--------+-----+--------+--------+--------+  +-----------+--------+-----+--------+--------+--------+  LEFT        PSV cm/s Ratio Stenosis Waveform Comments  +-----------+--------+-----+--------+--------+--------+  CFA Distal  135                     biphasic           +-----------+--------+-----+--------+--------+--------+  DFA         87                      biphasic           +-----------+--------+-----+--------+--------+--------+  SFA Prox    89                      biphasic           +-----------+--------+-----+--------+--------+--------+  SFA Mid     195                     biphasic           +-----------+--------+-----+--------+--------+--------+  SFA Distal  46                      biphasic           +-----------+--------+-----+--------+--------+--------+  POP Distal  70                      biphasic           +-----------+--------+-----+--------+--------+--------+  ATA Distal  49                      biphasic           +-----------+--------+-----+--------+--------+--------+  PTA Distal  0                       Absent             +-----------+--------+-----+--------+--------+--------+  PERO Distal 26                      biphasic           +-----------+--------+-----+--------+--------+--------+   Summary: Right: Imaging and Waveforms obtained throughout in the Right Lower Extremity. Biphasic Waveforms seen predominantly in the Right Lower Extremity. Left: Imaging and Waveforms obtained throughout in the Left Lower Extremity. Biphasic Waveforms seen predominantly in the Left Lower Extremity.  See table(s) above for measurements and observations. Electronically signed by Levora Dredge MD on 02/01/2020 at 11:46:17 AM.    Final     Assessment &  Plan    1.Non-ST elevation myocardial infarction-echo showed wall motion abnormalities with moderate reduced LV function. Not a candidate for invasive evaluation at present given comorbidities. Currently on  aspirin and Plavix. Continues with beta-blocker. We will continue with this for now and consider cardiac cath however, pt is not an ideal candidate for valve replacement via savr or tavr given comobidities. Currently renal function is somewhat decreased from yesterday. Will need to reduce diuresis. Creatinine 1.6. Not candidate for cath at present. Appreciated nephrology input. Marland Kitchen  2.Iron deficiency anemia-had one dark stoolhowever no active bleeding while on anticoagulation and antiplatelet therapy. Hemoglobin 8.7 this morning..   No active bleeding at present is noted.Will remain on aspirin and Plavix.   3.Respiratory failure-on oxygen supplementation. Has underlying COPD as well as most likely diastolic failure secondary to her aortic valve disease.Continue with abx.   4.Heart failure-EF mildly reduced with anterolateral hypokinesis severe AS and moderate MR. We will continue with diureticsfollowing renal functioni.   5. CKD-appreciate nephrology input.    Signed, Darlin Priestly Joliene Salvador MD 02/20/2020, 11:27 AM  Pager: [phone]

## 2020-02-20 NOTE — Progress Notes (Signed)
Patient taken off cpap and placed back on Nasal canula per patient request.

## 2020-02-20 NOTE — Progress Notes (Signed)
Continues to have labored breathing and feels tired. I had a repeat goals of care discussion with the patient and daughter at bedside. After discussion, plan is to continue with Full code, Full scope of care.   - Palliative care consulted to assist with care  Whitney Post, DO  Full note to follow

## 2020-02-20 NOTE — Progress Notes (Signed)
Subjective:  Patient seen in the room today for the follow up of AKI and hyponatremia Patient's baseline creatinine is 0.79 from January 13, 2019 Admit creatinine of 1.19. Creatinine today is 1.6   Objective: Vital signs in last 24 hours: Temp:  [97.8 F (36.6 C)-98.6 F (37 C)] 98 F (36.7 C) (08/24 1133) Pulse Rate:  [92-107] 92 (08/24 1227) Resp:  [15-22] 22 (08/24 1227) BP: (89-142)/(55-75) 142/75 (08/24 1133) SpO2:  [95 %-100 %] 99 % (08/24 1227) Weight:  [71 kg] 71 kg (08/24 0230) Weight change: -0.59 kg  Intake/Output from previous day: 08/23 0701 - 08/24 0700 In: 1180 [P.O.:1080; IV Piggyback:100] Out: 1300 [Urine:1300] Intake/Output this shift: Total I/O In: 243 [P.O.:240; I.V.:3] Out: 525 [Urine:525]  Physical Exam: General:  Resting in bed, appears lethargic  HEENT  [place] with O2  4L on flow  Lungs:  Lungs with expiratory wheezes, accessory muscle use noted  Heart::  S1S2, regular,systolic murmur+, no rub or gallop  Abdomen:  Soft, nontender  Extremities:  No peripheral edema   Neurologic:  Alert, oriented  Skin: Warm, dry, no acute rashes     Lab Results: Recent Labs    02/19/20 0858 02/20/20 0411  WBC 19.2* 18.6*  HGB 8.9* 8.7*  HCT 28.6* 27.4*  PLT 381 399   BMET:  Recent Labs    02/19/20 0744 02/20/20 0411  NA 131* 135  K 4.6 4.7  CL 92* 94*  CO2 26 28  GLUCOSE 202* 145*  BUN 60* 63*  CREATININE 1.60* 1.57*  CALCIUM 9.4 9.4   No results for input(s): PTH in the last 72 hours. Iron Studies: No results for input(s): IRON, TIBC, TRANSFERRIN, FERRITIN in the last 72 hours.  Studies/Results: No results found.    Assessment/Plan:  Marcelo Baldy is a 86 y.o.   female with peripheral vascular disease, diabetes, COPD was admitted on 02/13/2020 with Acute pulmonary edema (HCC) [J81.0] NSTEMI (non-ST elevated myocardial infarction) (HCC) [I21.4] Atherosclerosis of artery of extremity with intermittent claudication (HCC) [I70.219] Acute respiratory  failure with hypoxemia (HCC) [J96.01] New onset of congestive heart failure (HCC) [I50.9]   # Hyponatremia Sodium level today is 135 which is her baseline.Patient has chronic hyponatremia which may be due to SIADH related to her underlying lung disease.  Her baseline sodium levels are 130-134.    # AKI Creatinine today is down to 1.57. BUN elevated slightly today to 63.  Patient noted to be on Lasix.  Administer cautiously as BUN and creatinine both rising.  #Shortness of breath, COPD exacerbation Patient still reports 'feeling bad', increased respiratory effort with accessory muscles use, and bilateral wheezes noted. SOB managed by the hospitalist  using bronchodilators,steroids and antibiotics Encouraged to use Incentive spirometer May use diuresis as needed Pulmonary Consult in process   LOS: 7 days   Cannan Beeck 02/20/2020,1:34 PM

## 2020-02-20 NOTE — Progress Notes (Signed)
Patient has no history of a fib and looking at tele monitor she is running afib at 100-105. Blood pressures a bit soft as well. Notified Webb Silversmith, the covering provider.

## 2020-02-21 ENCOUNTER — Encounter
Admission: EM | Disposition: A | Discharge status: Hospice | Payer: Self-pay | Source: Home / Self Care | Attending: Internal Medicine

## 2020-02-21 ENCOUNTER — Inpatient Hospital Stay: Payer: Medicare Other

## 2020-02-21 DIAGNOSIS — I351 Nonrheumatic aortic (valve) insufficiency: Secondary | ICD-10-CM

## 2020-02-21 DIAGNOSIS — I5041 Acute combined systolic (congestive) and diastolic (congestive) heart failure: Secondary | ICD-10-CM

## 2020-02-21 DIAGNOSIS — Z66 Do not resuscitate: Secondary | ICD-10-CM

## 2020-02-21 DIAGNOSIS — Z515 Encounter for palliative care: Secondary | ICD-10-CM

## 2020-02-21 DIAGNOSIS — D509 Iron deficiency anemia, unspecified: Secondary | ICD-10-CM

## 2020-02-21 DIAGNOSIS — R7989 Other specified abnormal findings of blood chemistry: Secondary | ICD-10-CM

## 2020-02-21 DIAGNOSIS — I350 Nonrheumatic aortic (valve) stenosis: Secondary | ICD-10-CM

## 2020-02-21 DIAGNOSIS — I4891 Unspecified atrial fibrillation: Secondary | ICD-10-CM

## 2020-02-21 DIAGNOSIS — R0602 Shortness of breath: Secondary | ICD-10-CM

## 2020-02-21 DIAGNOSIS — Z7189 Other specified counseling: Secondary | ICD-10-CM

## 2020-02-21 DIAGNOSIS — I340 Nonrheumatic mitral (valve) insufficiency: Secondary | ICD-10-CM

## 2020-02-21 HISTORY — PX: RIGHT/LEFT HEART CATH AND CORONARY ANGIOGRAPHY: CATH118266

## 2020-02-21 LAB — CBC
HCT: 28.7 % — ABNORMAL LOW (ref 36.0–46.0)
Hemoglobin: 9.1 g/dL — ABNORMAL LOW (ref 12.0–15.0)
MCH: 25.6 pg — ABNORMAL LOW (ref 26.0–34.0)
MCHC: 31.7 g/dL (ref 30.0–36.0)
MCV: 80.6 fL (ref 80.0–100.0)
Platelets: 416 10*3/uL — ABNORMAL HIGH (ref 150–400)
RBC: 3.56 MIL/uL — ABNORMAL LOW (ref 3.87–5.11)
RDW: 18.8 % — ABNORMAL HIGH (ref 11.5–15.5)
WBC: 17.4 10*3/uL — ABNORMAL HIGH (ref 4.0–10.5)
nRBC: 0 % (ref 0.0–0.2)

## 2020-02-21 LAB — BASIC METABOLIC PANEL
Anion gap: 14 (ref 5–15)
BUN: 71 mg/dL — ABNORMAL HIGH (ref 8–23)
CO2: 29 mmol/L (ref 22–32)
Calcium: 9.7 mg/dL (ref 8.9–10.3)
Chloride: 92 mmol/L — ABNORMAL LOW (ref 98–111)
Creatinine, Ser: 1.64 mg/dL — ABNORMAL HIGH (ref 0.44–1.00)
GFR calc Af Amer: 32 mL/min — ABNORMAL LOW (ref >60)
GFR calc non Af Amer: 28 mL/min — ABNORMAL LOW (ref >60)
Glucose, Bld: 105 mg/dL — ABNORMAL HIGH (ref 70–99)
Potassium: 4.3 mmol/L (ref 3.5–5.1)
Sodium: 135 mmol/L (ref 135–145)

## 2020-02-21 LAB — GLUCOSE, CAPILLARY
Glucose-Capillary: 96 mg/dL (ref 70–99)
Glucose-Capillary: 159 mg/dL — ABNORMAL HIGH (ref 70–99)
Glucose-Capillary: 157 mg/dL — ABNORMAL HIGH (ref 70–99)

## 2020-02-21 LAB — MAGNESIUM: Magnesium: 2.1 mg/dL (ref 1.7–2.4)

## 2020-02-21 SURGERY — RIGHT/LEFT HEART CATH AND CORONARY ANGIOGRAPHY
Anesthesia: Moderate Sedation

## 2020-02-21 MED ORDER — SODIUM CHLORIDE 0.9% FLUSH
3.0000 mL | Freq: Two times a day (BID) | INTRAVENOUS | Status: DC
  Administered 2020-02-22: 3 mL via INTRAVENOUS

## 2020-02-21 MED ORDER — ACETAMINOPHEN 325 MG PO TABS: 650.0000 mg | ORAL_TABLET | ORAL | Status: DC | PRN

## 2020-02-21 MED ORDER — ONDANSETRON HCL 4 MG/2ML IJ SOLN: 4.0000 mg | Freq: Four times a day (QID) | INTRAMUSCULAR | Status: DC | PRN

## 2020-02-21 MED ORDER — FUROSEMIDE 10 MG/ML IJ SOLN
INTRAMUSCULAR | Status: DC | PRN
  Administered 2020-02-21: 40 mg via INTRAVENOUS

## 2020-02-21 MED ORDER — HYDRALAZINE HCL 20 MG/ML IJ SOLN: 10.0000 mg | INTRAMUSCULAR | Status: AC | PRN

## 2020-02-21 MED ORDER — SODIUM CHLORIDE 0.9% FLUSH
3.0000 mL | Freq: Two times a day (BID) | INTRAVENOUS | Status: DC
  Administered 2020-02-21 – 2020-02-22 (×3): 3 mL via INTRAVENOUS

## 2020-02-21 MED ORDER — SODIUM CHLORIDE 0.9 % IV SOLN: 250.0000 mL | INTRAVENOUS | Status: DC | PRN

## 2020-02-21 MED ORDER — HEPARIN (PORCINE) IN NACL 1000-0.9 UT/500ML-% IV SOLN
INTRAVENOUS | Status: DC | PRN
  Administered 2020-02-21: 500 mL

## 2020-02-21 MED ORDER — FUROSEMIDE 10 MG/ML IJ SOLN
INTRAMUSCULAR | Status: AC
  Filled 2020-02-21: qty 4

## 2020-02-21 MED ORDER — ASPIRIN 81 MG PO CHEW: 81.0000 mg | CHEWABLE_TABLET | ORAL | Status: DC

## 2020-02-21 MED ORDER — LABETALOL HCL 5 MG/ML IV SOLN: 10.0000 mg | INTRAVENOUS | Status: AC | PRN

## 2020-02-21 MED ORDER — SODIUM CHLORIDE 0.9% FLUSH: 3.0000 mL | INTRAVENOUS | Status: DC | PRN

## 2020-02-21 MED ORDER — IOHEXOL 300 MG/ML  SOLN
INTRAMUSCULAR | Status: DC | PRN
  Administered 2020-02-21: 85 mL

## 2020-02-21 SURGICAL SUPPLY — 10 items
CATH INFINITI 5FR JL4 (CATHETERS) ×3 IMPLANT
CATH INFINITI JR4 5F (CATHETERS) ×3 IMPLANT
CATH SWANZ 7F THERMO (CATHETERS) ×3 IMPLANT
DEVICE CLOSURE MYNXGRIP 5F (Vascular Products) ×3 IMPLANT
KIT MANI 3VAL PERCEP (MISCELLANEOUS) ×3 IMPLANT
NEEDLE PERC 18GX7CM (NEEDLE) ×3 IMPLANT
PACK CARDIAC CATH (CUSTOM PROCEDURE TRAY) ×3 IMPLANT
SHEATH AVANTI 6FR X 11CM (SHEATH) ×3 IMPLANT
SHEATH AVANTI 7FRX11 (SHEATH) ×3 IMPLANT
WIRE GUIDERIGHT .035X150 (WIRE) ×3 IMPLANT

## 2020-02-21 NOTE — Progress Notes (Signed)
Cardiac cath completed this afternoon. No immediate complications.  Coronary arteries:  LM-normal LAD-60-7% mid stenosis LCx-75% mid stenosis RCA insignificant disease  PCWP 20 mmHg.   Does not appear to require cabg or pci  Aortic valve is significantly stenosed. Will discuss with cardiothoracic surgery. High risk for tavr/savr. Will check renal function in ama.

## 2020-02-21 NOTE — Progress Notes (Signed)
Subjective: 86 year old female with history of peripheral vascular disease,HTN, diabetes,CKD,CHF, iron deficiency Anemia, and aortic Stenosis diagnosed with NSTEMI during this admission.  Update Patient found resting in bed, with her daughter at the bedside, in no acute distress. Patient's baseline creatinine is 0.79 from January 13, 2019 Admit creatinine of 1.19. Creatinine today is 1.6.   Objective: Vital signs in last 24 hours: Temp:  [97.7 F (36.5 C)-98.5 F (36.9 C)] 98 F (36.7 C) (08/25 0536) Pulse Rate:  [92-110] 100 (08/25 0536) Resp:  [18-22] 18 (08/24 2322) BP: (119-144)/(59-84) 125/61 (08/25 0536) SpO2:  [95 %-100 %] 95 % (08/25 0741) Weight:  [70.4 kg] 70.4 kg (08/25 0628) Weight change: -0.59 kg  Intake/Output from previous day: 08/24 0701 - 08/25 0700 In: 483 [P.O.:480; I.V.:3] Out: 2425 [Urine:2425] Intake/Output this shift: No intake/output data recorded.  Physical Exam: General:  Resting in bed, in no acute distress  HEENT  Eyes open to call, [place] with O2  4L on  Lungs:  Bilateral wheezes, increased respiratory effort  Heart::  Regular rate and rhythm,systolic murmur+, no rub or gallop  Abdomen:  Soft, nontender  Extremities:  No peripheral edema   Neurologic:  Alert, oriented  Skin:  Warm, dry, no acute rashes     Lab Results: Recent Labs    02/20/20 0411 02/21/20 0511  WBC 18.6* 17.4*  HGB 8.7* 9.1*  HCT 27.4* 28.7*  PLT 399 416*   BMET:  Recent Labs    02/20/20 0411 02/21/20 0511  NA 135 135  K 4.7 4.3  CL 94* 92*  CO2 28 29  GLUCOSE 145* 105*  BUN 63* 71*  CREATININE 1.57* 1.64*  CALCIUM 9.4 9.7   No results for input(s): PTH in the last 72 hours. Iron Studies: No results for input(s): IRON, TIBC, TRANSFERRIN, FERRITIN in the last 72 hours.  Studies/Results: DG Chest Port 1 View  Result Date: 02/21/2020 CLINICAL DATA:  Acute hypoxemic respiratory failure. EXAM: PORTABLE CHEST 1 VIEW COMPARISON:  February 17, 2020. FINDINGS: Stable  cardiomediastinal silhouette. No pneumothorax or pleural effusion is noted. Diffuse interstitial densities are noted concerning for edema or atypical inflammation. Bony thorax is unremarkable. IMPRESSION: Diffuse interstitial densities are noted concerning for edema or atypical inflammation. Aortic Atherosclerosis (ICD10-I70.0). Electronically Signed   By: Lupita Raider M.D.   On: 02/21/2020 08:31      Assessment/Plan:  Marcelo Baldy is a 86 y.o.   female with peripheral vascular disease, diabetes, COPD was admitted on 02/13/2020 with Acute pulmonary edema (HCC) [J81.0] NSTEMI (non-ST elevated myocardial infarction) (HCC) [I21.4] Atherosclerosis of artery of extremity with intermittent claudication (HCC) [I70.219] Acute respiratory failure with hypoxemia (HCC) [J96.01] New onset of congestive heart failure (HCC) [I50.9]   # Hyponatremia Sodium level today is 135 which is her baseline.Patient has chronic hyponatremia which may be due to SIADH related to her underlying lung disease.  Her baseline sodium levels are 130-134.    # AKI Creatinine today is 1.64 BUN  Today elevated to  71  Patient noted to be on Lasix.  Administer cautiously as BUN and creatinine both rising.  Patient due for cardiac catheterization.  Unfortunately we cannot administer renal protective IV fluids given her respiratory status.  Risk of contrast-induced nephropathy discussed with the patient.  She is moderate risk for this at the moment.  #Shortness of breath, COPD exacerbation Patient still has wheezes bilaterally and depends on O2 4L via nasal cannula. SOB managed by the hospitalist  using bronchodilators,steroids and antibiotics Seen by  pulmonary team yesterday  #NSTEMI Cardiology following up  Plan for cardiac catheterization today Risk for contrast use,  during cardiac catheterization discussed with patient and daughter today.    LOS: 8 days   Hollis Oh 02/21/2020,10:59 AM

## 2020-02-21 NOTE — Progress Notes (Signed)
Mobility Specialist - Progress Note   02/21/20 1324  Mobility  Activity Refused mobility (Pt undergoing procedure)  Mobility Response Tolerated well     Pt was still undergoing procedure. Will attempt session at a more appropriate time.   Filiberto Pinks Mobility Specialist 02/21/20, 1:25 PM

## 2020-02-21 NOTE — Consult Note (Signed)
Consultation Note Date: 02/21/2020   Patient Name: Hannah Conway  DOB: 1929/08/12  MRN: [id number]  Age / Sex: 86 y.o., female  PCP: Ezequiel Kayser, MD Referring Physician: Mercy Riding, MD  Reason for Consultation: Establishing goals of care  HPI/Patient Profile: 86 y.o. female  with past medical history of PVD, T2DM, HTN, and CKD admitted on 02/13/2020 with shortness of breath. Patient found to have NSTEMI.  Acute respiratory failure is ongoing and r/t HF exacerbation/COPD exacerbation. Patient also with AKI. Patient scheduled for cardiac cath 8/25. PMT consulted to discuss [place].  Clinical Assessment and Goals of Care: I have reviewed medical records including EPIC notes, labs and imaging, received report from RN, assessed the patient and then met with patient and daughter to discuss diagnosis prognosis, GOC, EOL wishes, disposition and options. Conversation joined with other daughter Tye [place] and spouse via telephone.   I introduced Palliative Medicine as specialized medical care for people living with serious illness. It focuses on providing relief from the symptoms and stress of a serious illness. The goal is to improve quality of life for both the patient and the family.  As far as functional and nutritional status, family tells me patient has not been doing well at home for the past couple of months. She has been weak and requiring walker for ambulation. They tell me she is chronically short of breath and has chronic cough. She also has a poor appetite. They speak of confusion here in the hospital but not at home.   We discussed patient's current illness and what it means in the larger context of patient's on-going co-morbidities.  Natural disease trajectory and expectations at EOL were discussed. We discussed her heart failure and COPD. Discussed chronic progressive nature of these illnesses. Discussed her symptoms and impact on quality of life.   I  attempted to elicit values and goals of care important to the patient. The difference between aggressive medical intervention and comfort care was considered in light of the patient's goals of care.   Patient is interested in proceeding with cardiac cath today. She expresses understanding of her illness and poor prognosis. We discussed code status. Encouraged patient to consider DNR/DNI status understanding evidenced based poor outcomes in similar hospitalized patients, as the cause of the arrest is likely associated with chronic/terminal disease rather than a reversible acute cardio-pulmonary event. She agrees to DNR status and family supports her decision.   She also shares that she is very much opposed to dialysis and would like this reflected in her medical record.   Discussed with patient/family the importance of continued conversation with family and the medical providers regarding overall plan of care and treatment options, ensuring decisions are within the context of the patient's values and GOCs.    Hospice and Palliative Care services outpatient were explained and offered. Patient and family are both interested in support of hospice at home. We discussed hospice philosophy of care and they agree.   Questions and concerns were addressed. The family was encouraged to call with questions or concerns.   Primary Decision Maker PATIENT - joined by her spouse and daughters    SUMMARY OF RECOMMENDATIONS   - code status changed to DNR - home with hospice once stable for discharge - proceed with cath - could trial low dose sublingual opioid for shortness of breath - not initiated during my visit as she had just been given xanax d/t anxiety r/t cath scheduled later today  Code Status/Advance Care Planning:  DNR  Prognosis:   < 6 months  Discharge Planning: Home with Hospice      Primary Diagnoses: Present on Admission: . NSTEMI (non-ST elevated myocardial infarction) ([place]) .  Hypertension associated with type 2 diabetes mellitus ([place])   I have reviewed the medical record, interviewed the patient and family, and examined the patient. The following aspects are pertinent.  Past Medical History:  Diagnosis Date  . Diabetes mellitus without complication ([place])   . Hypertension   . Peripheral artery disease (HCC)    Social History   Socioeconomic History  . Marital status: Married    Spouse name: Burton Apley   . Number of children: 5  . Years of education: Not on file  . Highest education level: Not on file  Occupational History    Employer: RETIRED    Comment: [employer]. [employer]  Tobacco Use  . Smoking status: Current Every Day Smoker    Packs/day: 1.00    Years: 30.00    Pack years: 30.00    Types: Cigarettes  . Smokeless tobacco: Never Used  Vaping Use  . Vaping Use: Never used  Substance and Sexual Activity  . Alcohol use: Never  . Drug use: Not Currently  . Sexual activity: Not on file  Other Topics Concern  . Not on file  Social History Narrative  . Not on file   Social Determinants of Health   Financial Resource Strain:   . Difficulty of Paying Living Expenses: Not on file  Food Insecurity:   . Worried About Charity fundraiser in the Last Year: Not on file  . Ran Out of Food in the Last Year: Not on file  Transportation Needs:   . Lack of Transportation (Medical): Not on file  . Lack of Transportation (Non-Medical): Not on file  Physical Activity:   . Days of Exercise per Week: Not on file  . Minutes of Exercise per Session: Not on file  Stress:   . Feeling of Stress : Not on file  Social Connections:   . Frequency of Communication with Friends and Family: Not on file  . Frequency of Social Gatherings with Friends and Family: Not on file  . Attends Religious Services: Not on file  . Active Member of Clubs or Organizations: Not on file  . Attends Archivist Meetings: Not on file  . Marital Status:  Not on file   Family History  Problem Relation Age of Onset  . Congestive Heart Failure Mother   . Diabetes Father   . Diabetes Sister    Scheduled Meds: . [MAR Hold] ALPRAZolam  0.5 mg Oral QHS  . [START ON 02/22/2020] aspirin  81 mg Oral Pre-Cath  . [MAR Hold] aspirin EC  81 mg Oral Daily  . [MAR Hold] budesonide (PULMICORT) nebulizer solution  0.5 mg Nebulization BID  . [MAR Hold] buPROPion  150 mg Oral Daily  . [MAR Hold] calcium-vitamin D  1 tablet Oral Q lunch  . [MAR Hold] clopidogrel  75 mg Oral Daily  . [MAR Hold] doxycycline  100 mg Oral Q12H  . [MAR Hold] feeding supplement (ENSURE ENLIVE)  237 mL Oral BID BM  . [MAR Hold] furosemide  40 mg Intravenous BID  . [MAR Hold] insulin aspart  0-20 Units Subcutaneous TID WC  . [MAR Hold] insulin glargine  30 Units Subcutaneous QHS  . [MAR Hold] ipratropium-albuterol  3 mL Nebulization TID  . [MAR Hold] metoprolol tartrate  12.5 mg Oral BID  . [  MAR Hold] multivitamin with minerals  1 tablet Oral Daily  . [MAR Hold] omega-3 acid ethyl esters  1 g Oral Daily  . [MAR Hold] pantoprazole (PROTONIX) IV  40 mg Intravenous Q12H  . [MAR Hold] pravastatin  40 mg Oral q1800  . [MAR Hold] sodium chloride flush  3 mL Intravenous Q12H  . [MAR Hold] sodium chloride flush  3 mL Intravenous Q12H   Continuous Infusions: . [MAR Hold] sodium chloride 250 mL (02/21/20 1306)   PRN Meds:.[MAR Hold] sodium chloride, [MAR Hold] acetaminophen, [MAR Hold] albuterol, [MAR Hold] ALPRAZolam, [MAR Hold] ondansetron (ZOFRAN) IV, [MAR Hold] sodium chloride flush, [MAR Hold] zolpidem Allergies  Allergen Reactions  . Atorvastatin Other (See Comments)    "Felt as if she would pass out"  . Simvastatin Other (See Comments)    "Felt as if she would pass out"    Review of Systems  Constitutional: Positive for activity change and appetite change.  Respiratory: Positive for shortness of breath.   Psychiatric/Behavioral: The patient is nervous/anxious.      Physical Exam Constitutional:      General: She is not in acute distress. Pulmonary:     Comments: Mild increased work of breathing Skin:    General: Skin is warm and dry.  Neurological:     Mental Status: She is alert and oriented to person, place, and time.     Vital Signs: BP 103/74   Pulse (!) 105   Temp 98.8 F (37.1 C) (Oral)   Resp 20   Ht 5' 2"  (1.575 m)   Wt 70.4 kg   SpO2 95%   BMI 28.40 kg/m  Pain Scale: 0-10 POSS *See Group Information*: 1-Acceptable,Awake and alert Pain Score: 0-No pain   SpO2: SpO2: 95 % O2 Device:SpO2: 95 % O2 Flow Rate: .O2 Flow Rate (L/min): 4 L/min  IO: Intake/output summary:   Intake/Output Summary (Last 24 hours) at 02/21/2020 1315 Last data filed at 02/21/2020 0620 Gross per 24 hour  Intake 240 ml  Output 1900 ml  Net -1660 ml    LBM: Last BM Date: 02/18/20 Baseline Weight: Weight: 67.6 kg Most recent weight: Weight: 70.4 kg     Palliative Assessment/Data: PPS 40%    Time Total: 55 minutes Greater than 50%  of this time was spent counseling and coordinating care related to the above assessment and plan.  Juel Burrow, DNP, AGNP-C Palliative Medicine Team [phone] Pager: [phone]

## 2020-02-21 NOTE — Progress Notes (Signed)
PROGRESS NOTE  Hannah Conway ZOX:[id number] DOB: [birth year]   PCP: Mickey Farber, MD  Patient is from: Home  DOA: 02/13/2020 LOS: 8  Brief Narrative / Interim history: 86 year old female with a PMH of DM-2, HTN and PVD presenting with acutely worsening dyspnea/respiratory distress, dry cough and nausea for about 4 days.  She had no chest pain or palpitation.  In ED, hemodynamically stable.  Saturation 90% on CPAP.  WBC 14.5.  Hgb 8.  Otherwise CBC and CMP without significant finding.  VBG without significant finding.  High-sensitivity troponin 2180> 1848.  Pro-Cal negative.  CXR with diffuse interstitial opacity concerning for pulmonary edema.  EKG sinus rhythm with PACs, incomplete RBBB prolonged QTC to 503.  Patient received aspirin, IV Lasix and started on IV heparin for non-STEMI.  Cardiology consulted.   Echo with EF of 45 to 50%, moderate LVH, G2-DD, severe hypokinesis, moderate MVR, moderate AVR and severe AS.  Patient had new onset A. fib while in the hospital.   Hospital course complicated by new onset A. Fib with mild RVR, AKI and drop in hemoglobin/melena.  Nephrology consulted for AKI.  Patient received 1 units of blood with appropriate response.  GI consulted but did not feel stable enough for endoscopy.  Pulmonology consulted about persistent poor respiratory status.  Palliative medicine consulted.  CODE STATUS changed to DNR/DNI.  Patient is to undergo left heart catheterization today.   Subjective: Seen and examined earlier this morning.  No major events overnight of this morning.  Reports having a good night.  She denies chest pain.  Still with shortness of breath intermittently.  Breathing worse with eating.  Denies palpitation, nausea, vomiting, abdominal pain or UTI symptoms.  Patient's daughter at bedside.  Lengthy discussion about goal of care and CODE STATUS.  I have explained the limited success rate of CPR and consequences.  She was surprised to hear and had a pause for  few minutes.  I have recommended against CPR or intubation in case of emergency.  Both patient and patient's daughter were undecided but would like to think about it more and let me know their decision.   Objective: Vitals:   02/21/20 0536 02/21/20 0628 02/21/20 0741 02/21/20 1302  BP: 125/61   103/74  Pulse: 100   (!) 105  Resp:    20  Temp: 98 F (36.7 C)   98.8 F (37.1 C)  TempSrc: Oral   Oral  SpO2: 95%  95% 95%  Weight:  70.4 kg    Height:        Intake/Output Summary (Last 24 hours) at 02/21/2020 1406 Last data filed at 02/21/2020 0620 Gross per 24 hour  Intake 240 ml  Output 1900 ml  Net -1660 ml   Filed Weights   02/19/20 0613 02/20/20 0230 02/21/20 0628  Weight: 71.6 kg 71 kg 70.4 kg    Examination:  GENERAL: No apparent distress.  Nontoxic. HEENT: MMM.  Vision and hearing grossly intact.  NECK: Supple.  No apparent JVD.  RESP: On 4 L by [place].  Some increased work of breathing.  Fair aeration bilaterally. CVS: Irregular rhythm.  Normal rate.  2/6 SEM over RUSB and LUSB. ABD/GI/GU: BS+. Abd soft, NTND.  MSK/EXT:  Moves extremities. No apparent deformity. No edema.  SKIN: no apparent skin lesion or wound NEURO: Awake, alert and oriented appropriately.  No apparent focal neuro deficit. PSYCH: Calm. Normal affect.  Procedures:  None  Microbiology summarized: COVID-19 PCR negative. MRSA PCR negative. Urine culture with  multiple species.  Assessment & Plan: NSTEMI: No chest pain patent dyspnea.  High-sensitivity troponin 2180>> 1390> 1500.  [place]heryl EKG sinus rhythm with nonspecific ST depression in lateral leads, incomplete RBBB and PACs. Echo as above. -Cardiology managing-plan for LHC today -She is of heparin drip. -Continue aspirin, Plavix, beta-blocker, statin -Palliative medicine following.  Acute combined CHF: BNP elevated.  CXR with vascular congestion.  Echo with EF 45 to 50%, moderate LVH, G2-DD, severe hypokinesis, moderate MVR, moderate AVR and  severe AS.  Excellent diuresis on IV Lasix.  About 2.45L UOP/24 hours.  Net -4 L.  Overall appears euvolemic except for bilateral crackles.  Renal function is slightly up. -Cardiology managing-on IV Lasix 40 mg twice daily per cardiology -GDMT-per cardiology. -Monitor fluid status and renal functions.  Severe AS/moderate AVR/moderate MVR-not a candidate for valve replacement.  Acute respiratory failure with hypoxia-multifactorial including non-STEMI, CHF, anemia and valvular lesions as above.  Currently on 4 L by nasal cannula. -Appreciate input by pulmonology-as needed morphine and CPAP -Encourage incentive spirometry  New onset atrial fibrillation without RVR: Rate controlled. -Continue low-dose metoprolol. -Not on anticoagulation likely out of concern for GI bleed/anemia.  AKI on CKD-3A tinea: Cr 0.79 in 12/2018> 1.19 (admit)> 1.60> 1.64.  BUN 71. -Nephrology managing.  Acute on chronic iron deficiency anemia: Hgb 11.0 in 07/2018> 8.0 (admit)> 6.5>1u> 7.4> 8.7> 9.1.  Iron sat 4%.  TIBC 454.  Ferritin 13. -Received 1 unit of blood so far. -Received iron infusion on 8/20 -Continue multivitamin and p.o. iron -Monitor H&H  Controlled DM-2 with hyperglycemia: A1c 6.5%. No results for input(s): HGBA1C in the last 72 hours. Recent Labs  Lab 02/20/20 0746 02/20/20 1132 02/20/20 1652 02/20/20 2106 02/21/20 0724  GLUCAP 137* 164* 304* 331* 96  -Continue CBG monitoring and SSI -Continue statin.  Hyponatremia: Resolved.  Likely hypervolemic hyponatremia -Continue monitoring -Diuretics as above.  Anxiety and depression: Stable. -Continue home medications  Debility/physical deconditioning -PT/OT  GOC/DNR/DNI-lengthy discussion about patient's goal of care and and CODE STATUS in light of her comorbidities and poor prognosis. -Appreciate palliative input.  Possibly discharge home with home hospice once cleared by consultants   Body mass index is 28.4 kg/m. Nutrition Problem:  Inadequate oral intake Etiology: poor appetite Signs/Symptoms: per patient/family report Interventions: Ensure Enlive (each supplement provides 350kcal and 20 grams of protein), MVI, Education   DVT prophylaxis:  Will start subcu Lovenox if no further anticoagulation by cardiology.  Code Status: DNR/DNI Family Communication: Updated patient's daughter at bedside. Status is: Inpatient  Remains inpatient appropriate because:Ongoing diagnostic testing needed not appropriate for outpatient work up, Unsafe d/c plan, IV treatments appropriate due to intensity of illness or inability to take PO and Inpatient level of care appropriate due to severity of illness   Dispo: The patient is from: Home              Anticipated d/c is to: Home              Anticipated d/c date is: 1 day              Patient currently is not medically stable to d/c.       Consultants:  Cardiology Pulmonology Nephrology Gastroenterology-signed off Palliative medicine   Sch Meds:  Scheduled Meds: . [MAR Hold] ALPRAZolam  0.5 mg Oral QHS  . [START ON 02/22/2020] aspirin  81 mg Oral Pre-Cath  . [MAR Hold] aspirin EC  81 mg Oral Daily  . [MAR Hold] budesonide (PULMICORT) nebulizer solution  0.5 mg Nebulization BID  . [MAR Hold] buPROPion  150 mg Oral Daily  . [MAR Hold] calcium-vitamin D  1 tablet Oral Q lunch  . [MAR Hold] clopidogrel  75 mg Oral Daily  . [MAR Hold] doxycycline  100 mg Oral Q12H  . [MAR Hold] feeding supplement (ENSURE ENLIVE)  237 mL Oral BID BM  . [MAR Hold] furosemide  40 mg Intravenous BID  . [MAR Hold] insulin aspart  0-20 Units Subcutaneous TID WC  . [MAR Hold] insulin glargine  30 Units Subcutaneous QHS  . [MAR Hold] ipratropium-albuterol  3 mL Nebulization TID  . [MAR Hold] metoprolol tartrate  12.5 mg Oral BID  . [MAR Hold] multivitamin with minerals  1 tablet Oral Daily  . [MAR Hold] omega-3 acid ethyl esters  1 g Oral Daily  . [MAR Hold] pantoprazole (PROTONIX) IV  40 mg  Intravenous Q12H  . [MAR Hold] pravastatin  40 mg Oral q1800  . [MAR Hold] sodium chloride flush  3 mL Intravenous Q12H  . [MAR Hold] sodium chloride flush  3 mL Intravenous Q12H   Continuous Infusions: . [MAR Hold] sodium chloride 250 mL (02/21/20 1306)   PRN Meds:.[MAR Hold] sodium chloride, [MAR Hold] acetaminophen, [MAR Hold] albuterol, [MAR Hold] ALPRAZolam, furosemide, Heparin (Porcine) in NaCl, [MAR Hold] ondansetron (ZOFRAN) IV, [MAR Hold] sodium chloride flush, [MAR Hold] zolpidem  Antimicrobials: Anti-infectives (From admission, onward)   Start     Dose/Rate Route Frequency Ordered Stop   02/18/20 1000  [MAR Hold]  doxycycline (VIBRA-TABS) tablet 100 mg        (MAR Hold since Wed 02/21/2020 at 1302.Hold Reason: Transfer to a Procedural area.)   100 mg Oral Every 12 hours 02/18/20 0936 02/23/20 0959   02/16/20 1500  ceFEPIme (MAXIPIME) 2 g in sodium chloride 0.9 % 100 mL IVPB        2 g 200 mL/hr over 30 Minutes Intravenous Daily 02/16/20 1428 02/20/20 1050       I have personally reviewed the following labs and images: CBC: Recent Labs  Lab 02/15/20 0411 02/15/20 0411 02/16/20 0517 02/16/20 0517 02/17/20 0705 02/18/20 0612 02/19/20 0858 02/20/20 0411 02/21/20 0511  WBC 15.7*   < > 16.2*   < > 16.9* 20.3* 19.2* 18.6* 17.4*  NEUTROABS 9.4*  --  11.1*  --   --   --   --   --   --   HGB 8.5*   < > 8.5*   < > 8.1* 8.7* 8.9* 8.7* 9.1*  HCT 28.1*   < > 26.2*   < > 25.8* 26.8* 28.6* 27.4* 28.7*  MCV 81.2   < > 76.8*   < > 79.6* 77.7* 81.0 79.9* 80.6  PLT 409*   < > 393   < > 403* 411* 381 399 416*   < > = values in this interval not displayed.   BMP &GFR Recent Labs  Lab 02/15/20 0411 02/15/20 0411 02/16/20 0517 02/16/20 0517 02/17/20 0705 02/18/20 0612 02/19/20 0744 02/20/20 0411 02/21/20 0511  NA 129*   < > 126*   < > 130* 131* 131* 135 135  K 4.3   < > 4.0   < > 3.4* 4.4 4.6 4.7 4.3  CL 91*   < > 87*   < > 89* 92* 92* 94* 92*  CO2 27   < > 28   < > 28 26  26 28 29   GLUCOSE 175*   < > 160*   < > 139*  125* 202* 145* 105*  BUN 48*   < > 47*   < > 43* 47* 60* 63* 71*  CREATININE 1.52*   < > 1.30*   < > 1.29* 1.46* 1.60* 1.57* 1.64*  CALCIUM 9.3   < > 8.9   < > 9.1 9.3 9.4 9.4 9.7  MG 1.9  --  1.9  --  2.2  --   --  2.0 2.1   < > = values in this interval not displayed.   Estimated Creatinine Clearance: 21.8 mL/min (A) (by C-G formula based on SCr of 1.64 mg/dL (H)). Liver & Pancreas: No results for input(s): AST, ALT, ALKPHOS, BILITOT, PROT, ALBUMIN in the last 168 hours. No results for input(s): LIPASE, AMYLASE in the last 168 hours. No results for input(s): AMMONIA in the last 168 hours. Diabetic: No results for input(s): HGBA1C in the last 72 hours. Recent Labs  Lab 02/20/20 0746 02/20/20 1132 02/20/20 1652 02/20/20 2106 02/21/20 0724  GLUCAP 137* 164* 304* 331* 96   Cardiac Enzymes: No results for input(s): CKTOTAL, CKMB, CKMBINDEX, TROPONINI in the last 168 hours. No results for input(s): PROBNP in the last 8760 hours. Coagulation Profile: No results for input(s): INR, PROTIME in the last 168 hours. Thyroid Function Tests: No results for input(s): TSH, T4TOTAL, FREET4, T3FREE, THYROIDAB in the last 72 hours. Lipid Profile: No results for input(s): CHOL, HDL, LDLCALC, TRIG, CHOLHDL, LDLDIRECT in the last 72 hours. Anemia Panel: No results for input(s): VITAMINB12, FOLATE, FERRITIN, TIBC, IRON, RETICCTPCT in the last 72 hours. Urine analysis:    Component Value Date/Time   COLORURINE YELLOW (A) 02/17/2020 1749   APPEARANCEUR CLOUDY (A) 02/17/2020 1749   APPEARANCEUR Cloudy 07/25/2013 1305   LABSPEC 1.011 02/17/2020 1749   LABSPEC 1.014 07/25/2013 1305   PHURINE 6.0 02/17/2020 1749   GLUCOSEU 150 (A) 02/17/2020 1749   GLUCOSEU Negative 07/25/2013 1305   HGBUR LARGE (A) 02/17/2020 1749   BILIRUBINUR NEGATIVE 02/17/2020 1749   BILIRUBINUR Negative 07/25/2013 1305   KETONESUR NEGATIVE 02/17/2020 1749   PROTEINUR NEGATIVE  02/17/2020 1749   NITRITE NEGATIVE 02/17/2020 1749   LEUKOCYTESUR LARGE (A) 02/17/2020 1749   LEUKOCYTESUR 3+ 07/25/2013 1305   Sepsis Labs: Invalid input(s): PROCALCITONIN, LACTICIDVEN  Microbiology: Recent Results (from the past 240 hour(s))  SARS Coronavirus 2 by RT PCR (hospital order, performed in [facility] hospital lab) Nasopharyngeal Nasopharyngeal Swab     Status: None   Collection Time: 02/13/20  1:37 AM   Specimen: Nasopharyngeal Swab  Result Value Ref Range Status   SARS Coronavirus 2 NEGATIVE NEGATIVE Final    Comment: (NOTE) SARS-CoV-2 target nucleic acids are NOT DETECTED.  The SARS-CoV-2 RNA is generally detectable in upper and lower respiratory specimens during the acute phase of infection. The lowest concentration of SARS-CoV-2 viral copies this assay can detect is 250 copies / mL. A negative result does not preclude SARS-CoV-2 infection and should not be used as the sole basis for treatment or other patient management decisions.  A negative result may occur with improper specimen collection / handling, submission of specimen other than nasopharyngeal swab, presence of viral mutation(s) within the areas targeted by this assay, and inadequate number of viral copies (<250 copies / mL). A negative result must be combined with clinical observations, patient history, and epidemiological information.  Fact Sheet for Patients:   BoilerBrush.com.cy  Fact Sheet for Healthcare Providers: https://pope.com/  This test is not yet approved or  cleared by the [place] FDA and has  been authorized for detection and/or diagnosis of SARS-CoV-2 by FDA under an Emergency Use Authorization (EUA).  This EUA will remain in effect (meaning this test can be used) for the duration of the COVID-19 declaration under Section 564(b)(1) of the Act, 21 U.S.C. section 360bbb-3(b)(1), unless the authorization is terminated or revoked  sooner.  Performed at [facility], [address] [place], [place] [address]   MRSA PCR Screening     Status: None   Collection Time: 02/16/20  4:53 PM   Specimen: Nasopharyngeal  Result Value Ref Range Status   MRSA by PCR NEGATIVE NEGATIVE Final    Comment:        The GeneXpert MRSA Assay (FDA approved for NASAL specimens only), is one component of a comprehensive MRSA colonization surveillance program. It is not intended to diagnose MRSA infection nor to guide or monitor treatment for MRSA infections. Performed at [facility], [address], [place], [place] [address]   Urine Culture     Status: Abnormal   Collection Time: 02/18/20  5:48 PM   Specimen: Urine, Random  Result Value Ref Range Status   Specimen Description   Final    URINE, RANDOM Performed at [facility], [address], [place], [place] [address]    Special Requests   Final    NONE Performed at [facility], [address] [place], [place] [address]    Culture MULTIPLE SPECIES PRESENT, SUGGEST RECOLLECTION (A)  Final   Report Status 02/19/2020 FINAL  Final    Radiology Studies: [facility] Chest Port 1 View  Result Date: 02/21/2020 CLINICAL DATA:  Acute hypoxemic respiratory failure. EXAM: PORTABLE CHEST 1 VIEW COMPARISON:  February 17, 2020. FINDINGS: Stable cardiomediastinal silhouette. No pneumothorax or pleural effusion is noted. Diffuse interstitial densities are noted concerning for edema or atypical inflammation. Bony thorax is unremarkable. IMPRESSION: Diffuse interstitial densities are noted concerning for edema or atypical inflammation. Aortic Atherosclerosis (ICD10-I70.0). Electronically Signed   By: Lupita Raider M.D.   On: 02/21/2020 08:31    55 minutes with more than 50% spent in reviewing records, counseling patient/family and coordinating care.   Ranveer Wahlstrom T. Griffey [facility]  If 7PM-7AM, please contact night-coverage [email or web address] 02/21/2020,  2:06 PM

## 2020-02-21 NOTE — Progress Notes (Signed)
Patient Name: Hannah Conway Date of Encounter: 02/21/2020  Hospital Problem List     Principal Problem:   Acute hypoxemic respiratory failure (HCC) Active Problems:   Hypertension associated with type 2 diabetes mellitus (HCC)   Type 2 diabetes mellitus with stage 3 chronic kidney disease, with long-term current use of insulin (HCC)   NSTEMI (non-ST elevated myocardial infarction) (HCC)   COPD with acute exacerbation [facility])    Patient Profile     86 year old female with history of peripheral vascular disease, diabetes who was in preop in preparation for an angiogram of her lower extremities by vascular surgery. She became acutely short of breath which had started in retrospect 4 days earlier but worsened on presentation to the hospital. She was sent to the emergency room where she complained of a dry cough. EKG showed nondiagnostic changes for ischemia. She had elevated troponins to 2180 which have subsequently diminished. She was felt to have a non-ST elevation myocardial infarction. Echo revealed ejection fraction 45 to 50% with hypokinesis of the anterolateral wall. She had severe aortic stenosis with a mean gradient of 40 mmHg with a V-max of 3.98 m/s. Her hospital course was complicated by anemia requiring transfusion as well as increasing respiratory distress. Chest x-ray showed diffuse interstitial opacity in the lower lobes.   Subjective   Less sob  Inpatient Medications    . ALPRAZolam  0.5 mg Oral QHS  . aspirin EC  81 mg Oral Daily  . budesonide (PULMICORT) nebulizer solution  0.5 mg Nebulization BID  . buPROPion  150 mg Oral Daily  . calcium-vitamin D  1 tablet Oral Q lunch  . clopidogrel  75 mg Oral Daily  . doxycycline  100 mg Oral Q12H  . feeding supplement (ENSURE ENLIVE)  237 mL Oral BID BM  . furosemide  40 mg Intravenous BID  . insulin aspart  0-20 Units Subcutaneous TID WC  . insulin glargine  30 Units Subcutaneous QHS  . ipratropium-albuterol  3 mL  Nebulization TID  . metoprolol tartrate  12.5 mg Oral BID  . multivitamin with minerals  1 tablet Oral Daily  . omega-3 acid ethyl esters  1 g Oral Daily  . pantoprazole (PROTONIX) IV  40 mg Intravenous Q12H  . pravastatin  40 mg Oral q1800  . sodium chloride flush  3 mL Intravenous Q12H    Vital Signs    Vitals:   02/20/20 2322 02/21/20 0200 02/21/20 0536 02/21/20 0628  BP:   125/61   Pulse: (!) 110  100   Resp: 18     Temp:   98 F (36.7 C)   TempSrc:   Oral   SpO2: 96% 95% 95%   Weight:    70.4 kg  Height:        Intake/Output Summary (Last 24 hours) at 02/21/2020 0734 Last data filed at 02/21/2020 8563 Gross per 24 hour  Intake 483 ml  Output 2425 ml  Net -1942 ml   Filed Weights   02/19/20 0613 02/20/20 0230 02/21/20 0628  Weight: 71.6 kg 71 kg 70.4 kg    Physical Exam    GEN: Well nourished, well developed, in no acute distress.  HEENT: normal.  Neck: Supple, no JVD, carotid bruits, or masses. Cardiac: RRR, no murmurs, rubs, or gallops. No clubbing, cyanosis, edema.  Radials/DP/PT 2+ and equal bilaterally.  Respiratory:  Respirations regular and unlabored, clear to auscultation bilaterally. GI: Soft, nontender, nondistended, BS + x 4. MS: no deformity or atrophy. Skin:  warm and dry, no rash. Neuro:  Strength and sensation are intact. Psych: Normal affect.  Labs    CBC Recent Labs    02/20/20 0411 02/21/20 0511  WBC 18.6* 17.4*  HGB 8.7* 9.1*  HCT 27.4* 28.7*  MCV 79.9* 80.6  PLT 399 416*   Basic Metabolic Panel Recent Labs    78/46/96 0411 02/21/20 0511  NA 135 135  K 4.7 4.3  CL 94* 92*  CO2 28 29  GLUCOSE 145* 105*  BUN 63* 71*  CREATININE 1.57* 1.64*  CALCIUM 9.4 9.7  MG 2.0 2.1   Liver Function Tests No results for input(s): AST, ALT, ALKPHOS, BILITOT, PROT, ALBUMIN in the last 72 hours. No results for input(s): LIPASE, AMYLASE in the last 72 hours. Cardiac Enzymes No results for input(s): CKTOTAL, CKMB, CKMBINDEX, TROPONINI in  the last 72 hours. BNP Recent Labs    02/20/20 1308  BNP 1,613.4*   D-Dimer No results for input(s): DDIMER in the last 72 hours. Hemoglobin A1C No results for input(s): HGBA1C in the last 72 hours. Fasting Lipid Panel No results for input(s): CHOL, HDL, LDLCALC, TRIG, CHOLHDL, LDLDIRECT in the last 72 hours. Thyroid Function Tests No results for input(s): TSH, T4TOTAL, T3FREE, THYROIDAB in the last 72 hours.  Invalid input(s): FREET3  Telemetry    nsr  ECG    nsr  Radiology    DG Chest Port 1 View  Result Date: 02/17/2020 CLINICAL DATA:  Hypoxia, current smoker EXAM: PORTABLE CHEST 1 VIEW COMPARISON:  Chest radiograph from one day prior. FINDINGS: Stable cardiomediastinal silhouette with mild cardiomegaly. No pneumothorax. Small stable right pleural effusion. No left pleural effusion. Diffuse prominence of the parahilar interstitial markings, stable. Patchy opacities at both lung bases, stable. IMPRESSION: 1. Stable mild cardiomegaly with diffuse prominence of the parahilar interstitial markings, suggesting pulmonary edema. 2. Stable small right pleural effusion. 3. Stable patchy bibasilar lung opacities, favor atelectasis, cannot exclude a component of aspiration or pneumonia. Electronically Signed   By: Delbert Phenix M.D.   On: 02/17/2020 15:36   DG Chest Port 1 View  Result Date: 02/16/2020 CLINICAL DATA:  Cough and respiratory distress EXAM: PORTABLE CHEST 1 VIEW COMPARISON:  February 13, 2020 FINDINGS: There is consolidation in the right base region with right pleural effusion. There is cardiomegaly with pulmonary venous hypertension. There is diffuse interstitial edema. No adenopathy. There is aortic atherosclerosis. IMPRESSION: Cardiomegaly with pulmonary vascular congestion. Diffuse interstitial edema with right pleural effusion. Suspect congestive heart failure. Superimposed airspace opacity in the right lower lung region may represent superimposed pneumonia. There may also  be alveolar edema in this area. Both edema and pneumonia may present concurrently. Aortic Atherosclerosis (ICD10-I70.0). Electronically Signed   By: Bretta Bang III M.D.   On: 02/16/2020 10:36   DG Chest Portable 1 View  Result Date: 02/13/2020 CLINICAL DATA:  Dyspnea EXAM: PORTABLE CHEST 1 VIEW COMPARISON:  04/28/2013 FINDINGS: Diffuse interstitial opacity, worst in the lower lobes. No pneumothorax or sizable pleural effusion. IMPRESSION: Diffuse interstitial opacity, worst in the lower lobes, likely indicating pulmonary edema. Electronically Signed   By: Deatra Robinson M.D.   On: 02/13/2020 02:09   VAS [place] ABI WITH/WO TBI  Result Date: 02/01/2020 LOWER EXTREMITY DOPPLER STUDY Indications: Peripheral artery disease, and 08/09/2018              Bilat CIA PTA stents              Left EIA PTA stent  Right SFA to Pop PTA stent.  Vascular Interventions: 01/13/2019: PTA and Stent to the Right SFA and Popliteal                         Artery. Mechanical Thrombectomy of Occluded Rt SFA                         stents. Comparison Study: 01/30/2019 Performing Technologist: Debbe BalesSolomon Mcclary RVS  Examination Guidelines: A complete evaluation includes at minimum, Doppler waveform signals and systolic blood pressure reading at the level of bilateral brachial, anterior tibial, and posterior tibial arteries, when vessel segments are accessible. Bilateral testing is considered an integral part of a complete examination. Photoelectric Plethysmograph (PPG) waveforms and toe systolic pressure readings are included as required and additional duplex testing as needed. Limited examinations for reoccurring indications may be performed as noted.  ABI Findings: +---------+------------------+-----+--------+--------+ Right    Rt Pressure (mmHg)IndexWaveformComment  +---------+------------------+-----+--------+--------+ Brachial 132                                      +---------+------------------+-----+--------+--------+ ATA      143               1.04 biphasic         +---------+------------------+-----+--------+--------+ PTA      130               0.94 biphasic         +---------+------------------+-----+--------+--------+ Great Toe110               0.80 Normal           +---------+------------------+-----+--------+--------+ +---------+------------------+-----+--------+-------+ Left     Lt Pressure (mmHg)IndexWaveformComment +---------+------------------+-----+--------+-------+ Brachial 138                                    +---------+------------------+-----+--------+-------+ ATA      99                0.72 biphasic        +---------+------------------+-----+--------+-------+ PERO     103               0.75 biphasic        +---------+------------------+-----+--------+-------+ Great Toe71                0.51 Abnormal        +---------+------------------+-----+--------+-------+ +-------+-----------+-----------+------------+------------+ ABI/TBIToday's ABIToday's TBIPrevious ABIPrevious TBI +-------+-----------+-----------+------------+------------+ Right  1.04       .80        1.04        .55          +-------+-----------+-----------+------------+------------+ Left   .75        .51        .73         .83          +-------+-----------+-----------+------------+------------+ Right ABIs appear essentially unchanged compared to prior study on 01/30/2019. Left ABIs appear essentially unchanged compared to prior study on 01/30/2019. Rt TBIs appear to be increased compared to prior study on 01/30/2019. Lt TBIs appear to be decreased compared to prior study on 01/30/2019.  Summary: Right: Resting right ankle-brachial index is within normal range. No evidence of significant right lower extremity arterial disease. The right toe-brachial index is normal. Left: Resting left ankle-brachial index indicates  moderate left lower  extremity arterial disease. The left toe-brachial index is abnormal.  *See table(s) above for measurements and observations.  Electronically signed by Levora Dredge MD on 02/01/2020 at 11:46:14 AM.    Final    ECHOCARDIOGRAM COMPLETE  Result Date: 02/13/2020    ECHOCARDIOGRAM REPORT   Patient Name:   Hannah Conway Date of Exam: 02/13/2020 Medical Rec #:  [id number]     Height:       62.0 in Accession #:    [id number]    Weight:       149.0 lb Date of Birth:  03-Aug-1929     BSA:          1.687 m Patient Age:    86 years      BP:           149/53 mmHg Patient Gender: F             HR:           81 bpm. Exam Location:  [facility] Procedure: Color Doppler, Cardiac Doppler, 2D Echo and Intracardiac            Opacification Agent Indications:     NSTEMI 121.4  History:         Patient has no prior history of Echocardiogram examinations.                  Risk Factors:Hypertension and Diabetes. PAD.  Sonographer:     Cristela Blue RDCS (AE) Referring Phys:  [id number] Vernetta Honey MANSY Diagnosing Phys: Cristal Deer End MD  Sonographer Comments: No subcostal window, suboptimal apical window and suboptimal parasternal window. Image acquisition challenging due to COPD. IMPRESSIONS  1. Left ventricular ejection fraction, by estimation, is 45 to 50%. The left ventricle has mildly decreased function. The left ventricle demonstrates regional wall motion abnormalities (see scoring diagram/findings for description). There is moderate left ventricular hypertrophy. Left ventricular diastolic parameters are consistent with Grade II diastolic dysfunction (pseudonormalization). Elevated left atrial pressure. There is severe hypokinesis of the left ventricular, entire anterolateral wall and inferolateral wall.  2. Right ventricular systolic function is normal. The right ventricular size is normal. Tricuspid regurgitation signal is inadequate for assessing PA pressure.  3. Left atrial size was mildly dilated.  4. The mitral valve is grossly normal.  Moderate mitral valve regurgitation. No evidence of mitral stenosis.  5. Unable to determine valve morphology due to image quality. Aortic valve regurgitation is moderate. Severe aortic valve stenosis. Aortic valve mean gradient measures 40.0 mmHg. Aortic valve Vmax measures 3.98 m/s.  6. Pulmonic valve regurgitation not well assessed. FINDINGS  Left Ventricle: Left ventricular ejection fraction, by estimation, is 45 to 50%. The left ventricle has mildly decreased function. The left ventricle demonstrates regional wall motion abnormalities. Severe hypokinesis of the left ventricular, entire anterolateral wall and inferolateral wall. Definity contrast agent was given IV to delineate the left ventricular endocardial borders. The left ventricular internal cavity size was normal in size. There is moderate left ventricular hypertrophy. Left ventricular diastolic parameters are consistent with Grade II diastolic dysfunction (pseudonormalization). Elevated left atrial pressure. Right Ventricle: The right ventricular size is normal. Right vetricular wall thickness was not assessed. Right ventricular systolic function is normal. Tricuspid regurgitation signal is inadequate for assessing PA pressure. Left Atrium: Left atrial size was mildly dilated. Right Atrium: Right atrial size was normal in size. Pericardium: The pericardium was not well visualized. Mitral Valve: The mitral valve is grossly normal. Moderate mitral annular calcification. Moderate  mitral valve regurgitation. No evidence of mitral valve stenosis. Tricuspid Valve: The tricuspid valve is not well visualized. Tricuspid valve regurgitation is trivial. Aortic Valve: Unable to determine valve morphology due to image quality.. There is severe thickening and mild calcification of the aortic valve. Aortic valve regurgitation is moderate. Severe aortic stenosis is present. There is severe thickening of the aortic valve. There is mild calcification of the aortic valve.  Aortic valve mean gradient measures 40.0 mmHg. Aortic valve peak gradient measures 63.4 mmHg. Aortic valve area, by VTI measures 0.65 cm. Pulmonic Valve: The pulmonic valve was not well visualized. Pulmonic valve regurgitation not well assessed. Aorta: The aortic root is normal in size and structure. Pulmonary Artery: The pulmonary artery is not well seen. Venous: The inferior vena cava was not well visualized. IAS/Shunts: The interatrial septum was not well visualized.  LEFT VENTRICLE PLAX 2D LVIDd:         3.50 cm      Diastology LVIDs:         2.47 cm      LV e' lateral:   3.92 cm/s LV PW:         1.22 cm      LV E/e' lateral: 37.0 LV IVS:        1.05 cm      LV e' medial:    4.57 cm/s LVOT diam:     2.00 cm      LV E/e' medial:  31.7 LV SV:         57 LV SV Index:   34 LVOT Area:     3.14 cm  LV Volumes (MOD) LV vol d, MOD A2C: 96.8 ml LV vol d, MOD A4C: 110.0 ml LV vol s, MOD A2C: 48.3 ml LV vol s, MOD A4C: 57.4 ml LV SV MOD A2C:     48.5 ml LV SV MOD A4C:     110.0 ml LV SV MOD BP:      50.5 ml RIGHT VENTRICLE RV Basal diam:  2.67 cm RV S prime:     13.60 cm/s TAPSE (M-mode): 2.5 cm LEFT ATRIUM             Index       RIGHT ATRIUM           Index LA diam:        4.40 cm 2.61 cm/m  RA Area:     13.20 cm LA Vol (A2C):   82.9 ml 49.14 ml/m RA Volume:   30.10 ml  17.84 ml/m LA Vol (A4C):   49.6 ml 29.40 ml/m LA Biplane Vol: 64.4 ml 38.17 ml/m  AORTIC VALVE                    PULMONIC VALVE AV Area (Vmax):    0.69 cm     RVOT Peak grad: 6 mmHg AV Area (Vmean):   0.60 cm AV Area (VTI):     0.65 cm AV Vmax:           398.00 cm/s AV Vmean:          283.333 cm/s AV VTI:            0.889 m AV Peak Grad:      63.4 mmHg AV Mean Grad:      40.0 mmHg LVOT Vmax:         87.50 cm/s LVOT Vmean:        53.800 cm/s LVOT VTI:  0.183 m LVOT/AV VTI ratio: 0.21  AORTA Ao Root diam: 2.80 cm MITRAL VALVE MV Area (PHT): 3.30 cm     SHUNTS MV Decel Time: 230 msec     Systemic VTI:  0.18 m MV E velocity: 145.00 cm/s   Systemic Diam: 2.00 cm MV A velocity: 119.00 cm/s MV E/A ratio:  1.22 Cristal Deer End MD Electronically signed by Yvonne Kendall MD Signature Date/Time: 02/13/2020/10:47:14 AM    Final    VAS [place] LOWER EXTREMITY ARTERIAL DUPLEX  Result Date: 02/01/2020 LOWER EXTREMITY ARTERIAL DUPLEX STUDY Indications: Peripheral artery disease, and 08/09/2018              Bilat CIA PTA stents              Left EIA PTA stent              Right SFA to Pop PTA stent.  Vascular Interventions: 01/13/2019: PTA and Stent to the Right SFA and Popliteal                         Artery. Mechanical Thrombectomy of Occluded Rt SFA                         stents. Current ABI:            Rt 1.04, Lt .75 Comparison Study: 01/30/2019 Performing Technologist: Debbe Bales RVS  Examination Guidelines: A complete evaluation includes B-mode imaging, spectral Doppler, color Doppler, and power Doppler as needed of all accessible portions of each vessel. Bilateral testing is considered an integral part of a complete examination. Limited examinations for reoccurring indications may be performed as noted.  +-----------+--------+-----+--------+--------+--------+ RIGHT      PSV cm/sRatioStenosisWaveformComments +-----------+--------+-----+--------+--------+--------+ CFA Distal 178                  biphasic         +-----------+--------+-----+--------+--------+--------+ DFA        213                  biphasic         +-----------+--------+-----+--------+--------+--------+ SFA Prox   303                  biphasicStent    +-----------+--------+-----+--------+--------+--------+ SFA Mid    95                   biphasicstent    +-----------+--------+-----+--------+--------+--------+ SFA Distal 69                   biphasic         +-----------+--------+-----+--------+--------+--------+ POP Distal 102                  biphasic         +-----------+--------+-----+--------+--------+--------+ ATA Distal 85                    biphasic         +-----------+--------+-----+--------+--------+--------+ PTA Distal 14                   biphasic         +-----------+--------+-----+--------+--------+--------+ PERO Distal41                   biphasic         +-----------+--------+-----+--------+--------+--------+  +-----------+--------+-----+--------+--------+--------+ LEFT       PSV cm/sRatioStenosisWaveformComments +-----------+--------+-----+--------+--------+--------+ CFA Distal 135  biphasic         +-----------+--------+-----+--------+--------+--------+ DFA        87                   biphasic         +-----------+--------+-----+--------+--------+--------+ SFA Prox   89                   biphasic         +-----------+--------+-----+--------+--------+--------+ SFA Mid    195                  biphasic         +-----------+--------+-----+--------+--------+--------+ SFA Distal 46                   biphasic         +-----------+--------+-----+--------+--------+--------+ POP Distal 70                   biphasic         +-----------+--------+-----+--------+--------+--------+ ATA Distal 49                   biphasic         +-----------+--------+-----+--------+--------+--------+ PTA Distal 0                    Absent           +-----------+--------+-----+--------+--------+--------+ PERO Distal26                   biphasic         +-----------+--------+-----+--------+--------+--------+   Summary: Right: Imaging and Waveforms obtained throughout in the Right Lower Extremity. Biphasic Waveforms seen predominantly in the Right Lower Extremity. Left: Imaging and Waveforms obtained throughout in the Left Lower Extremity. Biphasic Waveforms seen predominantly in the Left Lower Extremity.  See table(s) above for measurements and observations. Electronically signed by Levora Dredge MD on 02/01/2020 at 11:46:17 AM.    Final     Assessment &  Plan    1.Non-ST elevation myocardial infarction-echo showed wall motion abnormalities with moderate reduced LV function.ef 45% with anteriolateral and inferolateral hypokinesis.  Currently on aspirin and Plavix. Continues with beta-blocker. We will continue with this for now and proceed with  cardiac cath today to evaluate the extent of her cad to guide further consideration for treatment of her valve. Pt is not an ideal candidate for valve replacement via savr or tavr given comobidities. Currently renal function is reduced with creatinine back up to 1.6. Will do a cxr today to evaluate for evidence of change. Continue with careful diuresis and duap.  Hgb up to 9.1 Creatinine 1.64. Will discuss with cardiac surgery regarding their input on surgical options after cath info is obtained.  If not a surgical or tavr candidate would continue with medical therapy and avoid cath risk. Is at at least moderate risk for contrast but information is critical to further care plans.   2. AS-severe as by echo. Vmax 3.98 m/s with mean gradient of 40.0 mmHg. AVA 0.65 cm2. As per above, will discuss with cardiothoracic surgery regarding surgical options.   3.Iron deficiency anemia-no active bleeding while on anticoagulation and antiplatelet therapy. Hemoglobin 8.7this morning..  No active bleeding at present is noted.Will remain on aspirin and Plavix.  HGB 9.1  4.Respiratory failure-on oxygen supplementation. Has underlying COPD as well as most likely diastolic failure secondary to her aortic valve disease.Continue with abx.Evaluate with cxr today. Appreciate Dr. Clovis Fredrickson input.   5.Heart failure-EF  mildly reduced with anterolateral hypokinesis severe AS and moderate MR. We will continue with diureticsfollowing renal function. Appreciate nephrology input.   6. CKD-appreciate nephrology input.  Signed, Darlin Priestly Maleya Leever MD 02/21/2020, 7:34 AM  Pager: [phone]

## 2020-02-21 NOTE — Progress Notes (Signed)
Nutrition Follow Up Note   DOCUMENTATION CODES:   Not applicable  INTERVENTION:   Ensure Enlive po BID, each supplement provides 350 kcal and 20 grams of protein (chocolate)  MVI daily  NUTRITION DIAGNOSIS:   Inadequate oral intake related to poor appetite as evidenced by per patient/family report.  GOAL:   Patient will meet greater than or equal to 90% of their needs -progressing   MONITOR:   PO intake, Supplement acceptance, Weight trends, Labs, I & O's  ASSESSMENT:   86 y.o. female  with past medical history of PVD, T2DM, HTN, and CKD admitted on 02/13/2020 with shortness of breath. Patient found to have NSTEMI   Pt with improved appetite and oral intake; pt eating 50-100% of meals in hospital but is refusing most of the supplements. Pt NPO today for cardiac cath. Plan is for home hospice at discharge.   Per chart, pt appears fairly weight stable since admit.   Medications reviewed and include: aspirin, oscal w/ D, plavix, doxycycline, lasix, insulin, MVI, omega 3, protonix  Labs reviewed: BUN 71(H), creat 1.64(H) BNP- 1613.4(H)- 8/24 Wbc- 17.4(H), Hgb 9.1(L), Hct 28.7(L) cbgs- 137, 164, 304, 331, 94 x 24 hrs  Diet Order:   Diet Order            Diet NPO time specified  Diet effective now                EDUCATION NEEDS:   Education needs have been addressed  Skin:  Skin Assessment: Reviewed RN Assessment  Last BM:  8/20- type 6  Height:   Ht Readings from Last 1 Encounters:  02/13/20 5\' 2"  (1.575 m)    Weight:   Wt Readings from Last 1 Encounters:  02/21/20 70.4 kg    Ideal Body Weight:  50 kg  BMI:  Body mass index is 28.4 kg/m.  Estimated Nutritional Needs:   Kcal:  1400-1600kcal/day  Protein:  70-80g/day  Fluid:  1.3-1.5L/day  02/23/20 MS, RD, LDN Please refer to [facility] for RD and/or RD on-call/weekend/after hours pager

## 2020-02-21 NOTE — Progress Notes (Addendum)
Manufacturing engineer hospital liaison note:  New referral for [facility] hospice services at home received from [place]. Patient information sent to referral. Hospice eligibility confirmed.  Writer met in the room with patient, her daughter Malachy Mood and daughter Tye [place] on speaker phone to initiate education regarding hospice services, philosophy and team approach to care with understanding voiced. Questions answered. DME needs discussed: hospital bed, transport wheel chair, 3 in 1, over bed table, oxygen and nebulizer machine all ordered.  Hospice contact number left with [facility]. Patient to cardiac cath lab with transporter at end of visit.  Will continue to follow thorough discharge. Hospital care team updated. Thank you for this opportunity to be involved int he care of this patient and her family.  Flo Shanks BSN, RN, [place] [phone]

## 2020-02-22 DIAGNOSIS — I5043 Acute on chronic combined systolic (congestive) and diastolic (congestive) heart failure: Secondary | ICD-10-CM

## 2020-02-22 DIAGNOSIS — I480 Paroxysmal atrial fibrillation: Secondary | ICD-10-CM

## 2020-02-22 LAB — CBC
HCT: 31.4 % — ABNORMAL LOW (ref 36.0–46.0)
Hemoglobin: 9.5 g/dL — ABNORMAL LOW (ref 12.0–15.0)
MCH: 25.5 pg — ABNORMAL LOW (ref 26.0–34.0)
MCHC: 30.3 g/dL (ref 30.0–36.0)
MCV: 84.4 fL (ref 80.0–100.0)
Platelets: 408 10*3/uL — ABNORMAL HIGH (ref 150–400)
RBC: 3.72 MIL/uL — ABNORMAL LOW (ref 3.87–5.11)
RDW: 19.4 % — ABNORMAL HIGH (ref 11.5–15.5)
WBC: 17.3 10*3/uL — ABNORMAL HIGH (ref 4.0–10.5)
nRBC: 0 % (ref 0.0–0.2)

## 2020-02-22 LAB — RENAL FUNCTION PANEL
Albumin: 3.5 g/dL (ref 3.5–5.0)
Anion gap: 13 (ref 5–15)
BUN: 71 mg/dL — ABNORMAL HIGH (ref 8–23)
CO2: 31 mmol/L (ref 22–32)
Calcium: 9.7 mg/dL (ref 8.9–10.3)
Chloride: 93 mmol/L — ABNORMAL LOW (ref 98–111)
Creatinine, Ser: 1.62 mg/dL — ABNORMAL HIGH (ref 0.44–1.00)
GFR calc Af Amer: 33 mL/min — ABNORMAL LOW (ref >60)
GFR calc non Af Amer: 28 mL/min — ABNORMAL LOW (ref >60)
Glucose, Bld: 52 mg/dL — ABNORMAL LOW (ref 70–99)
Phosphorus: 4.2 mg/dL (ref 2.5–4.6)
Potassium: 3.7 mmol/L (ref 3.5–5.1)
Sodium: 137 mmol/L (ref 135–145)

## 2020-02-22 LAB — MAGNESIUM: Magnesium: 2.2 mg/dL (ref 1.7–2.4)

## 2020-02-22 LAB — GLUCOSE, CAPILLARY
Glucose-Capillary: 60 mg/dL — ABNORMAL LOW (ref 70–99)
Glucose-Capillary: 77 mg/dL (ref 70–99)
Glucose-Capillary: 172 mg/dL — ABNORMAL HIGH (ref 70–99)

## 2020-02-22 MED ORDER — INSULIN ASPART 100 UNIT/ML ~~LOC~~ SOLN: 0.0000 [IU] | Freq: Three times a day (TID) | SUBCUTANEOUS | Status: DC

## 2020-02-22 MED ORDER — PANTOPRAZOLE SODIUM 40 MG PO TBEC: 40.0000 mg | DELAYED_RELEASE_TABLET | Freq: Every day | ORAL | 0 refills | Status: AC

## 2020-02-22 MED ORDER — SENNOSIDES-DOCUSATE SODIUM 8.6-50 MG PO TABS: 1.0000 | ORAL_TABLET | Freq: Two times a day (BID) | ORAL | Status: DC | PRN

## 2020-02-22 MED ORDER — METOPROLOL SUCCINATE ER 25 MG PO TB24: 25.0000 mg | ORAL_TABLET | Freq: Every day | ORAL | 1 refills | Status: AC

## 2020-02-22 MED ORDER — LORAZEPAM 0.5 MG PO TABS: 0.5000 mg | ORAL_TABLET | ORAL | 0 refills | Status: AC | PRN

## 2020-02-22 MED ORDER — MORPHINE SULFATE (CONCENTRATE) 10 MG /0.5 ML PO SOLN: 10.0000 mg | ORAL | 0 refills | Status: AC | PRN

## 2020-02-22 MED ORDER — POLYETHYLENE GLYCOL 3350 17 G PO PACK: 17.0000 g | PACK | Freq: Two times a day (BID) | ORAL | Status: DC | PRN

## 2020-02-22 MED ORDER — INSULIN GLARGINE 100 UNIT/ML ~~LOC~~ SOLN
20.0000 [IU] | Freq: Every day | SUBCUTANEOUS | Status: DC
  Filled 2020-02-22: qty 0.2

## 2020-02-22 MED ORDER — FUROSEMIDE 40 MG PO TABS: 40.0000 mg | ORAL_TABLET | Freq: Two times a day (BID) | ORAL | 1 refills | Status: AC

## 2020-02-22 MED ORDER — INSULIN ASPART 100 UNIT/ML ~~LOC~~ SOLN: 0.0000 [IU] | Freq: Every day | SUBCUTANEOUS | Status: DC

## 2020-02-22 MED ORDER — IPRATROPIUM-ALBUTEROL 0.5-2.5 (3) MG/3ML IN SOLN: 3.0000 mL | Freq: Four times a day (QID) | RESPIRATORY_TRACT | 1 refills | Status: AC | PRN

## 2020-02-22 MED ORDER — ONDANSETRON 4 MG PO TBDP: 4.0000 mg | ORAL_TABLET | Freq: Three times a day (TID) | ORAL | 0 refills | Status: AC | PRN

## 2020-02-22 NOTE — Progress Notes (Signed)
Subjective: 86 year old female with history of peripheral vascular disease,HTN, diabetes,CKD,CHF, iron deficiency Anemia, and aortic Stenosis diagnosed with NSTEMI during this admission.  Update Patient underwent cardiac catheterization yesterday. Did not appear to have any stent double lesions present   Objective: Vital signs in last 24 hours: Temp:  [98 F (36.7 C)-98.8 F (37.1 C)] 98.4 F (36.9 C) (08/26 1113) Pulse Rate:  [94-109] 95 (08/26 1113) Resp:  [15-20] 17 (08/26 1113) BP: (103-130)/(56-76) 115/76 (08/26 1113) SpO2:  [95 %-100 %] 100 % (08/26 1113) Weight:  [67.9 kg] 67.9 kg (08/26 0525) Weight change: -2.495 kg  Intake/Output from previous day: 08/25 0701 - 08/26 0700 In: -  Out: 1200 [Urine:1200] Intake/Output this shift: Total I/O In: 600 [P.O.:600] Out: 300 [Urine:300]  Physical Exam: General:  Resting in bed, in no acute distress  HEENT  normocephalic atraumatic, oral mucosa moist  Lungs:  Bilateral wheezes, increased respiratory effort  Heart::  Regular rate and rhythm,systolic murmur+, no rub or gallop  Abdomen:  Soft, nontender  Extremities:  No peripheral edema   Neurologic:  Alert, oriented  Skin:  Warm, dry, no acute rashes     Lab Results: Recent Labs    02/21/20 0511 02/22/20 0536  WBC 17.4* 17.3*  HGB 9.1* 9.5*  HCT 28.7* 31.4*  PLT 416* 408*   BMET:  Recent Labs    02/21/20 0511 02/22/20 0536  NA 135 137  K 4.3 3.7  CL 92* 93*  CO2 29 31  GLUCOSE 105* 52*  BUN 71* 71*  CREATININE 1.64* 1.62*  CALCIUM 9.7 9.7   No results for input(s): PTH in the last 72 hours. Iron Studies: No results for input(s): IRON, TIBC, TRANSFERRIN, FERRITIN in the last 72 hours.  Studies/Results: DG Chest Port 1 View  Result Date: 02/21/2020 CLINICAL DATA:  Acute hypoxemic respiratory failure. EXAM: PORTABLE CHEST 1 VIEW COMPARISON:  February 17, 2020. FINDINGS: Stable cardiomediastinal silhouette. No pneumothorax or pleural effusion is noted.  Diffuse interstitial densities are noted concerning for edema or atypical inflammation. Bony thorax is unremarkable. IMPRESSION: Diffuse interstitial densities are noted concerning for edema or atypical inflammation. Aortic Atherosclerosis (ICD10-I70.0). Electronically Signed   By: Lupita Raider M.D.   On: 02/21/2020 08:31      Assessment/Plan:  Marcelo Baldy is a 86 y.o.   female with peripheral vascular disease, diabetes, COPD was admitted on 02/13/2020 with Acute pulmonary edema (HCC) [J81.0] NSTEMI (non-ST elevated myocardial infarction) (HCC) [I21.4] Atherosclerosis of artery of extremity with intermittent claudication (HCC) [I70.219] Acute respiratory failure with hypoxemia (HCC) [J96.01] New onset of congestive heart failure (HCC) [I50.9]   # Hyponatremia Sodium now normalized at 137.  Continue to periodically monitor.   # AKI BUN currently 71 with a creatinine of 1.6.  Renal function appears to be stable.  Thus far appears to have not developed worsening contrast-induced nephropathy from dye exposure yesterday.  #Shortness of breath, COPD exacerbation Continue supplemental oxygen, bronchodilators, and steroids as per primary team.  #NSTEMI Cardiology monitoring status. Underwent cardiac catheterization. Did not appear to have any stent double lesions. Cardiology did discuss if patient is a TAVR candidate.    LOS: 9 days   Farzad Tibbetts 02/22/2020,11:14 AM

## 2020-02-22 NOTE — Progress Notes (Signed)
[facility] hospital Liaison note:  Follow up visit made to new referral for [facility] hospice services at home. Patient seen sitting up in bed, she remains on 4 liters of oxygen. Daughter Elnita Maxwell at bedside and confirmed that DME has been in place. Hospital care team updated. Patient to discharge home today via non emergent transport. Family has accepted a 5:30 admissions appointment.  Dayna Barker BSN, RN, [facility] [employer] [phone]

## 2020-02-22 NOTE — Progress Notes (Signed)
   02/22/20 0800  Clinical Encounter Type  Visited With Patient  Visit Type Initial  Referral From Chaplain  Consult/Referral To Chaplain  While rounding the floor, chaplain saw patient sitting in the bed and stopped in. Chaplain stopped to say hello and talked with patient for a little while.

## 2020-02-22 NOTE — Care Management Important Message (Signed)
Important Message  Patient Details  Name: Hannah Conway MRN: [id number] Date of Birth: 1929/07/31   Medicare Important Message Given:  Yes     [name] 02/22/2020, 1:37 PM

## 2020-02-22 NOTE — TOC Transition Note (Signed)
Transition of Care [facility]) - CM/SW Discharge Note   Patient Details  Name: Hannah Conway MRN: [id number] Date of Birth: [birth year]  [facility] CM/SW Contact:  Maree Krabbe, LCSW Phone Number: 02/22/2020, 11:35 AM   Clinical Narrative:   DME has been delivered at pt's home per Clydie Braun with [facility]. Transport arranged through [employer]. Pick up is 1:00. RN aware.    Final next level of care: Home w Hospice Care Barriers to Discharge: No Barriers Identified   Patient Goals and CMS Choice        Discharge Placement                Patient to be transferred to facility by: [facility]   Patient and family notified of of transfer: 02/22/20  Discharge Plan and Services                                     Social Determinants of Health (SDOH) Interventions     Readmission Risk Interventions No flowsheet data found.

## 2020-02-22 NOTE — Discharge Summary (Signed)
Physician Discharge Summary  Hannah Conway ZOX:[id number] DOB: [birth year] DOA: 02/13/2020  PCP: Mickey Farber, MD  Admit date: 02/13/2020 Discharge date: 02/22/2020  Admitted From: Home Disposition: Home with home hospice  Discharge Condition: Stable but guarded prognosis CODE STATUS: DNR/DNI   Hospital Course: 86 year old female with a PMH of DM-2, HTN and PVD presenting with acutely worsening dyspnea/respiratory distress, dry cough and nausea for about 4 days.  She had no chest pain or palpitation.  In ED, hemodynamically stable.  Saturation 90% on CPAP.  WBC 14.5.  Hgb 8.  Otherwise CBC and CMP without significant finding.  VBG without significant finding.  High-sensitivity troponin 2180> 1848.  Pro-Cal negative.  CXR with diffuse interstitial opacity concerning for pulmonary edema.  EKG sinus rhythm with PACs, incomplete RBBB prolonged QTC to 503.  Patient received aspirin, IV Lasix and started on IV heparin for non-STEMI.  Cardiology consulted.   Echo with EF of 45 to 50%, moderate LVH, G2-DD, severe hypokinesis, moderate MVR, moderate AVR and severe AS.  Patient had new onset A. fib while in the hospital.   Hospital course complicated by new onset A. Fib with mild RVR, AKI and drop in hemoglobin/melena.  Nephrology consulted for AKI.  Patient received 1 units of blood with appropriate response.  GI consulted but did not feel stable enough for endoscopy.  Pulmonology consulted about persistent poor respiratory status.  Palliative medicine consulted.  CODE STATUS changed to DNR/DNI.  Patient underwent right and left heart catheterization on 12/22/2019.  Per cardiology, "LM-normal, LAD-60-70% mid stenosis, LCx-75% mid stenosis, RCA insignificant disease, and PCWP 20 mmHg".  She was deemed to be not a candidate for valve replacement.  Eventually, patient and family decided to go home with home hospice after meeting with palliative medicine.  See individual problem list below for more on  hospital course.  Discharge Diagnoses:  NSTEMI: No chest pain patent dyspnea.  High-sensitivity troponin 2180>> 1390> 1500.  Cheryl EKG sinus rhythm with nonspecific ST depression in lateral leads, incomplete RBBB and PACs. Echo and LHC as above. -Continue aspirin, Plavix, metoprolol, statin  Acute combined CHF: BNP elevated.  CXR with vascular congestion.  Echo with EF 45 to 50%, moderate LVH, G2-DD, severe hypokinesis, moderate MVR, moderate AVR and severe AS.    Good urine output with IV Lasix. Overall appears euvolemic except for bilateral crackles.  Creatinine plateaued at 1.62 (1.19 on admit). -Discharged on oral Lasix 40 mg twice daily.  She was on IV 40 mg twice daily in house. -GDMT-metoprolol XL 25 mg daily.   Severe AS/moderate AVR/moderate MVR-not a candidate for valve replacement.  Acute respiratory failure with hypoxia-multifactorial including non-STEMI, CHF, anemia and valvular lesions as above.   Improved. -Discharged on Roxanol, as needed DuoNeb and oxygen.  New onset atrial fibrillation without RVR: Rate controlled. -Continue low-dose metoprolol. -Not on anticoagulation likely out of concern for GI bleed/anemia.  AKI on CKD-3A tinea: Cr 0.79 in 12/2018> 1.19 (admit)> 1.60> 1.64> 1.62.  BUN 71.  Acute on chronic iron deficiency anemia: Hgb 11.0 in 07/2018> 8.0 (admit)> 6.5>1u> 7.4> 8.7> 9.1> 9.5.  Iron sat 4%.  TIBC 454.  Ferritin 13. -Received 1 unit of blood and iron infusion -Continue multivitamin and p.o. iron  Controlled DM-2 with hyperglycemia: A1c 6.5%. -Continue home insulin and a statin.  Hyponatremia: Likely hypervolemic hyponatremia.  Resolved.  Anxiety and depression: Stable. -Continue home medications  Debility/physical deconditioning  GOC/DNR/DNI- -Discharged with home hospice.    Body mass index is 27.4 kg/m.  Nutrition Problem: Inadequate oral intake Etiology: poor appetite Signs/Symptoms: per patient/family report Interventions:  Ensure Enlive (each supplement provides 350kcal and 20 grams of protein), MVI, Education      Discharge Exam: Vitals:   02/22/20 0754 02/22/20 0845  BP: (!) 104/56   Pulse: 94 (!) 101  Resp: 16 18  Temp: 98.5 F (36.9 C)   SpO2: 99% 99%    GENERAL: No apparent distress.  Nontoxic. HEENT: MMM.  Vision and hearing grossly intact.  NECK: Supple.  No apparent JVD.  RESP:  No IWOB.  Fair aeration.  Bibasilar crackles. CVS:  RRR. Heart sounds normal.  ABD/GI/GU: Bowel sounds present. Soft. Non tender.  MSK/EXT:  Moves extremities. No apparent deformity. No edema.  SKIN: no apparent skin lesion or wound NEURO: Awake, alert and oriented appropriately.  No apparent focal neuro deficit. PSYCH: Calm. Normal affect.  Discharge Instructions  Discharge Instructions    Diet - low sodium heart healthy   Complete by: As directed    Diet Carb Modified   Complete by: As directed    Increase activity slowly   Complete by: As directed      Allergies as of 02/22/2020      Reactions   Atorvastatin Other (See Comments)   "Felt as if she would pass out"   Simvastatin Other (See Comments)   "Felt as if she would pass out"      Medication List    STOP taking these medications   benazepril-hydrochlorthiazide 20-12.5 MG tablet Commonly known as: LOTENSIN HCT   Cinnamon 500 MG Tabs   Fish Oil 1000 MG Caps   oxyCODONE 5 MG immediate release tablet Commonly known as: Oxy IR/ROXICODONE     TAKE these medications   acetaminophen 500 MG tablet Commonly known as: TYLENOL Take 1,000 mg by mouth every 6 (six) hours as needed for moderate pain.   aspirin EC 81 MG tablet Take 81 mg by mouth daily.   buPROPion 150 MG 24 hr tablet Commonly known as: WELLBUTRIN XL Take 150 mg by mouth daily.   CALCIUM CITRATE + D3 PO Take 2 tablets by mouth daily with lunch.   clopidogrel 75 MG tablet Commonly known as: PLAVIX Take 1 tablet by mouth once daily with breakfast What changed: See the  new instructions.   furosemide 40 MG tablet Commonly known as: Lasix Take 1 tablet (40 mg total) by mouth 2 (two) times daily.   ipratropium-albuterol 0.5-2.5 (3) MG/3ML Soln Commonly known as: DUONEB Take 3 mLs by nebulization every 6 (six) hours as needed.   LORazepam 0.5 MG tablet Commonly known as: Ativan Take 1 tablet (0.5 mg total) by mouth every 4 (four) hours as needed for up to 20 doses for anxiety.   metoprolol succinate 25 MG 24 hr tablet Commonly known as: Toprol XL Take 1 tablet (25 mg total) by mouth daily.   morphine CONCENTRATE 10 mg / 0.5 ml concentrated solution Take 0.5 mLs (10 mg total) by mouth every 3 (three) hours as needed for moderate pain, severe pain or shortness of breath.   multivitamin with minerals Tabs tablet Take 1 tablet by mouth daily. Complete Multivitamin   NovoLOG Mix 70/30 FlexPen (70-30) 100 UNIT/ML FlexPen Generic drug: insulin aspart protamine - aspart Inject 35-55 Units into the skin See admin instructions. Inject 55 units subcutaneously in the morning and 35 in the evening   ondansetron 4 MG disintegrating tablet Commonly known as: Zofran ODT Take 1 tablet (4 mg total) by mouth every  8 (eight) hours as needed for nausea or vomiting.   pantoprazole 40 MG tablet Commonly known as: Protonix Take 1 tablet (40 mg total) by mouth daily.   pravastatin 40 MG tablet Commonly known as: PRAVACHOL Take 40 mg by mouth daily.       Consultations:  Cardiology  Nephrology  Gastroenterology  Palliative medicine  Pulmonology  Procedures/Studies:  2D Echo on 02/13/2020 1. Left ventricular ejection fraction, by estimation, is 45 to 50%. The  left ventricle has mildly decreased function. The left ventricle  demonstrates regional wall motion abnormalities (see scoring  diagram/findings for description). There is moderate  left ventricular hypertrophy. Left ventricular diastolic parameters are  consistent with Grade II diastolic  dysfunction (pseudonormalization).  Elevated left atrial pressure. There is severe hypokinesis of the left  ventricular, entire anterolateral wall  and inferolateral wall.  2. Right ventricular systolic function is normal. The right ventricular  size is normal. Tricuspid regurgitation signal is inadequate for assessing  PA pressure.  3. Left atrial size was mildly dilated.  4. The mitral valve is grossly normal. Moderate mitral valve  regurgitation. No evidence of mitral stenosis.  5. Unable to determine valve morphology due to image quality. Aortic  valve regurgitation is moderate. Severe aortic valve stenosis. Aortic  valve mean gradient measures 40.0 mmHg. Aortic valve Vmax measures 3.98  m/s.  6. Pulmonic valve regurgitation not well assessed.   Right and left heart catheterization on 02/21/2020 LM-normal LAD-60-70% mid stenosis LCx-75% mid stenosis RCA insignificant disease  PCWP 20 mmHg.     DG Chest Port 1 View  Result Date: 02/21/2020 CLINICAL DATA:  Acute hypoxemic respiratory failure. EXAM: PORTABLE CHEST 1 VIEW COMPARISON:  February 17, 2020. FINDINGS: Stable cardiomediastinal silhouette. No pneumothorax or pleural effusion is noted. Diffuse interstitial densities are noted concerning for edema or atypical inflammation. Bony thorax is unremarkable. IMPRESSION: Diffuse interstitial densities are noted concerning for edema or atypical inflammation. Aortic Atherosclerosis (ICD10-I70.0). Electronically Signed   By: Lupita Raider M.D.   On: 02/21/2020 08:31   DG Chest Port 1 View  Result Date: 02/17/2020 CLINICAL DATA:  Hypoxia, current smoker EXAM: PORTABLE CHEST 1 VIEW COMPARISON:  Chest radiograph from one day prior. FINDINGS: Stable cardiomediastinal silhouette with mild cardiomegaly. No pneumothorax. Small stable right pleural effusion. No left pleural effusion. Diffuse prominence of the parahilar interstitial markings, stable. Patchy opacities at both lung bases,  stable. IMPRESSION: 1. Stable mild cardiomegaly with diffuse prominence of the parahilar interstitial markings, suggesting pulmonary edema. 2. Stable small right pleural effusion. 3. Stable patchy bibasilar lung opacities, favor atelectasis, cannot exclude a component of aspiration or pneumonia. Electronically Signed   By: Delbert Phenix M.D.   On: 02/17/2020 15:36   DG Chest Port 1 View  Result Date: 02/16/2020 CLINICAL DATA:  Cough and respiratory distress EXAM: PORTABLE CHEST 1 VIEW COMPARISON:  February 13, 2020 FINDINGS: There is consolidation in the right base region with right pleural effusion. There is cardiomegaly with pulmonary venous hypertension. There is diffuse interstitial edema. No adenopathy. There is aortic atherosclerosis. IMPRESSION: Cardiomegaly with pulmonary vascular congestion. Diffuse interstitial edema with right pleural effusion. Suspect congestive heart failure. Superimposed airspace opacity in the right lower lung region may represent superimposed pneumonia. There may also be alveolar edema in this area. Both edema and pneumonia may present concurrently. Aortic Atherosclerosis (ICD10-I70.0). Electronically Signed   By: Bretta Bang III M.D.   On: 02/16/2020 10:36   DG Chest Portable 1 View  Result Date: 02/13/2020 CLINICAL  DATA:  Dyspnea EXAM: PORTABLE CHEST 1 VIEW COMPARISON:  04/28/2013 FINDINGS: Diffuse interstitial opacity, worst in the lower lobes. No pneumothorax or sizable pleural effusion. IMPRESSION: Diffuse interstitial opacity, worst in the lower lobes, likely indicating pulmonary edema. Electronically Signed   By: Deatra Robinson M.D.   On: 02/13/2020 02:09   VAS [place] ABI WITH/WO TBI  Result Date: 02/01/2020 LOWER EXTREMITY DOPPLER STUDY Indications: Peripheral artery disease, and 08/09/2018              Bilat CIA PTA stents              Left EIA PTA stent              Right SFA to Pop PTA stent.  Vascular Interventions: 01/13/2019: PTA and Stent to the Right SFA and  Popliteal                         Artery. Mechanical Thrombectomy of Occluded Rt SFA                         stents. Comparison Study: 01/30/2019 Performing Technologist: Debbe Bales RVS  Examination Guidelines: A complete evaluation includes at minimum, Doppler waveform signals and systolic blood pressure reading at the level of bilateral brachial, anterior tibial, and posterior tibial arteries, when vessel segments are accessible. Bilateral testing is considered an integral part of a complete examination. Photoelectric Plethysmograph (PPG) waveforms and toe systolic pressure readings are included as required and additional duplex testing as needed. Limited examinations for reoccurring indications may be performed as noted.  ABI Findings: +---------+------------------+-----+--------+--------+ Right    Rt Pressure (mmHg)IndexWaveformComment  +---------+------------------+-----+--------+--------+ Brachial 132                                     +---------+------------------+-----+--------+--------+ ATA      143               1.04 biphasic         +---------+------------------+-----+--------+--------+ PTA      130               0.94 biphasic         +---------+------------------+-----+--------+--------+ Great Toe110               0.80 Normal           +---------+------------------+-----+--------+--------+ +---------+------------------+-----+--------+-------+ Left     Lt Pressure (mmHg)IndexWaveformComment +---------+------------------+-----+--------+-------+ Brachial 138                                    +---------+------------------+-----+--------+-------+ ATA      99                0.72 biphasic        +---------+------------------+-----+--------+-------+ PERO     103               0.75 biphasic        +---------+------------------+-----+--------+-------+ Great Toe71                0.51 Abnormal         +---------+------------------+-----+--------+-------+ +-------+-----------+-----------+------------+------------+ ABI/TBIToday's ABIToday's TBIPrevious ABIPrevious TBI +-------+-----------+-----------+------------+------------+ Right  1.04       .80        1.04        .55          +-------+-----------+-----------+------------+------------+  Left   .75        .51        .73         .83          +-------+-----------+-----------+------------+------------+ Right ABIs appear essentially unchanged compared to prior study on 01/30/2019. Left ABIs appear essentially unchanged compared to prior study on 01/30/2019. Rt TBIs appear to be increased compared to prior study on 01/30/2019. Lt TBIs appear to be decreased compared to prior study on 01/30/2019.  Summary: Right: Resting right ankle-brachial index is within normal range. No evidence of significant right lower extremity arterial disease. The right toe-brachial index is normal. Left: Resting left ankle-brachial index indicates moderate left lower extremity arterial disease. The left toe-brachial index is abnormal.  *See table(s) above for measurements and observations.  Electronically signed by Levora Dredge MD on 02/01/2020 at 11:46:14 AM.    Final    ECHOCARDIOGRAM COMPLETE  Result Date: 02/13/2020    ECHOCARDIOGRAM REPORT   Patient Name:   Hannah Conway Date of Exam: 02/13/2020 Medical Rec #:  [id number]     Height:       62.0 in Accession #:    [id number]    Weight:       149.0 lb Date of Birth:  [birth year]     BSA:          1.687 m Patient Age:    89 years      BP:           149/53 mmHg Patient Gender: F             HR:           81 bpm. Exam Location:  [facility] Procedure: Color Doppler, Cardiac Doppler, 2D Echo and Intracardiac            Opacification Agent Indications:     NSTEMI 121.4  History:         Patient has no prior history of Echocardiogram examinations.                  Risk Factors:Hypertension and Diabetes. PAD.  Sonographer:     Cristela Blue RDCS (AE) Referring Phys:  [id number] Vernetta Honey MANSY Diagnosing Phys: Cristal Deer End MD  Sonographer Comments: No subcostal window, suboptimal apical window and suboptimal parasternal window. Image acquisition challenging due to COPD. IMPRESSIONS  1. Left ventricular ejection fraction, by estimation, is 45 to 50%. The left ventricle has mildly decreased function. The left ventricle demonstrates regional wall motion abnormalities (see scoring diagram/findings for description). There is moderate left ventricular hypertrophy. Left ventricular diastolic parameters are consistent with Grade II diastolic dysfunction (pseudonormalization). Elevated left atrial pressure. There is severe hypokinesis of the left ventricular, entire anterolateral wall and inferolateral wall.  2. Right ventricular systolic function is normal. The right ventricular size is normal. Tricuspid regurgitation signal is inadequate for assessing PA pressure.  3. Left atrial size was mildly dilated.  4. The mitral valve is grossly normal. Moderate mitral valve regurgitation. No evidence of mitral stenosis.  5. Unable to determine valve morphology due to image quality. Aortic valve regurgitation is moderate. Severe aortic valve stenosis. Aortic valve mean gradient measures 40.0 mmHg. Aortic valve Vmax measures 3.98 m/s.  6. Pulmonic valve regurgitation not well assessed. FINDINGS  Left Ventricle: Left ventricular ejection fraction, by estimation, is 45 to 50%. The left ventricle has mildly decreased function. The left ventricle demonstrates regional wall motion abnormalities. Severe hypokinesis of the left ventricular, entire anterolateral  wall and inferolateral wall. Definity contrast agent was given IV to delineate the left ventricular endocardial borders. The left ventricular internal cavity size was normal in size. There is moderate left ventricular hypertrophy. Left ventricular diastolic parameters are consistent with Grade II diastolic dysfunction  (pseudonormalization). Elevated left atrial pressure. Right Ventricle: The right ventricular size is normal. Right vetricular wall thickness was not assessed. Right ventricular systolic function is normal. Tricuspid regurgitation signal is inadequate for assessing PA pressure. Left Atrium: Left atrial size was mildly dilated. Right Atrium: Right atrial size was normal in size. Pericardium: The pericardium was not well visualized. Mitral Valve: The mitral valve is grossly normal. Moderate mitral annular calcification. Moderate mitral valve regurgitation. No evidence of mitral valve stenosis. Tricuspid Valve: The tricuspid valve is not well visualized. Tricuspid valve regurgitation is trivial. Aortic Valve: Unable to determine valve morphology due to image quality.. There is severe thickening and mild calcification of the aortic valve. Aortic valve regurgitation is moderate. Severe aortic stenosis is present. There is severe thickening of the aortic valve. There is mild calcification of the aortic valve. Aortic valve mean gradient measures 40.0 mmHg. Aortic valve peak gradient measures 63.4 mmHg. Aortic valve area, by VTI measures 0.65 cm. Pulmonic Valve: The pulmonic valve was not well visualized. Pulmonic valve regurgitation not well assessed. Aorta: The aortic root is normal in size and structure. Pulmonary Artery: The pulmonary artery is not well seen. Venous: The inferior vena cava was not well visualized. IAS/Shunts: The interatrial septum was not well visualized.  LEFT VENTRICLE PLAX 2D LVIDd:         3.50 cm      Diastology LVIDs:         2.47 cm      LV e' lateral:   3.92 cm/s LV PW:         1.22 cm      LV E/e' lateral: 37.0 LV IVS:        1.05 cm      LV e' medial:    4.57 cm/s LVOT diam:     2.00 cm      LV E/e' medial:  31.7 LV SV:         57 LV SV Index:   34 LVOT Area:     3.14 cm  LV Volumes (MOD) LV vol d, MOD A2C: 96.8 ml LV vol d, MOD A4C: 110.0 ml LV vol s, MOD A2C: 48.3 ml LV vol s, MOD A4C:  57.4 ml LV SV MOD A2C:     48.5 ml LV SV MOD A4C:     110.0 ml LV SV MOD BP:      50.5 ml RIGHT VENTRICLE RV Basal diam:  2.67 cm RV S prime:     13.60 cm/s TAPSE (M-mode): 2.5 cm LEFT ATRIUM             Index       RIGHT ATRIUM           Index LA diam:        4.40 cm 2.61 cm/m  RA Area:     13.20 cm LA Vol (A2C):   82.9 ml 49.14 ml/m RA Volume:   30.10 ml  17.84 ml/m LA Vol (A4C):   49.6 ml 29.40 ml/m LA Biplane Vol: 64.4 ml 38.17 ml/m  AORTIC VALVE                    PULMONIC VALVE AV Area (Vmax):    0.69  cm     RVOT Peak grad: 6 mmHg AV Area (Vmean):   0.60 cm AV Area (VTI):     0.65 cm AV Vmax:           398.00 cm/s AV Vmean:          283.333 cm/s AV VTI:            0.889 m AV Peak Grad:      63.4 mmHg AV Mean Grad:      40.0 mmHg LVOT Vmax:         87.50 cm/s LVOT Vmean:        53.800 cm/s LVOT VTI:          0.183 m LVOT/AV VTI ratio: 0.21  AORTA Ao Root diam: 2.80 cm MITRAL VALVE MV Area (PHT): 3.30 cm     SHUNTS MV Decel Time: 230 msec     Systemic VTI:  0.18 m MV E velocity: 145.00 cm/s  Systemic Diam: 2.00 cm MV A velocity: 119.00 cm/s MV E/A ratio:  1.22 Cristal Deer End MD Electronically signed by Yvonne Kendall MD Signature Date/Time: 02/13/2020/10:47:14 AM    Final    VAS [place] LOWER EXTREMITY ARTERIAL DUPLEX  Result Date: 02/01/2020 LOWER EXTREMITY ARTERIAL DUPLEX STUDY Indications: Peripheral artery disease, and 08/09/2018              Bilat CIA PTA stents              Left EIA PTA stent              Right SFA to Pop PTA stent.  Vascular Interventions: 01/13/2019: PTA and Stent to the Right SFA and Popliteal                         Artery. Mechanical Thrombectomy of Occluded Rt SFA                         stents. Current ABI:            Rt 1.04, Lt .75 Comparison Study: 01/30/2019 Performing Technologist: Debbe Bales RVS  Examination Guidelines: A complete evaluation includes B-mode imaging, spectral Doppler, color Doppler, and power Doppler as needed of all accessible portions of each  vessel. Bilateral testing is considered an integral part of a complete examination. Limited examinations for reoccurring indications may be performed as noted.  +-----------+--------+-----+--------+--------+--------+ RIGHT      PSV cm/sRatioStenosisWaveformComments +-----------+--------+-----+--------+--------+--------+ CFA Distal 178                  biphasic         +-----------+--------+-----+--------+--------+--------+ DFA        213                  biphasic         +-----------+--------+-----+--------+--------+--------+ SFA Prox   303                  biphasicStent    +-----------+--------+-----+--------+--------+--------+ SFA Mid    95                   biphasicstent    +-----------+--------+-----+--------+--------+--------+ SFA Distal 69                   biphasic         +-----------+--------+-----+--------+--------+--------+ POP Distal 102  biphasic         +-----------+--------+-----+--------+--------+--------+ ATA Distal 85                   biphasic         +-----------+--------+-----+--------+--------+--------+ PTA Distal 14                   biphasic         +-----------+--------+-----+--------+--------+--------+ PERO Distal41                   biphasic         +-----------+--------+-----+--------+--------+--------+  +-----------+--------+-----+--------+--------+--------+ LEFT       PSV cm/sRatioStenosisWaveformComments +-----------+--------+-----+--------+--------+--------+ CFA Distal 135                  biphasic         +-----------+--------+-----+--------+--------+--------+ DFA        87                   biphasic         +-----------+--------+-----+--------+--------+--------+ SFA Prox   89                   biphasic         +-----------+--------+-----+--------+--------+--------+ SFA Mid    195                  biphasic          +-----------+--------+-----+--------+--------+--------+ SFA Distal 46                   biphasic         +-----------+--------+-----+--------+--------+--------+ POP Distal 70                   biphasic         +-----------+--------+-----+--------+--------+--------+ ATA Distal 49                   biphasic         +-----------+--------+-----+--------+--------+--------+ PTA Distal 0                    Absent           +-----------+--------+-----+--------+--------+--------+ PERO Distal26                   biphasic         +-----------+--------+-----+--------+--------+--------+   Summary: Right: Imaging and Waveforms obtained throughout in the Right Lower Extremity. Biphasic Waveforms seen predominantly in the Right Lower Extremity. Left: Imaging and Waveforms obtained throughout in the Left Lower Extremity. Biphasic Waveforms seen predominantly in the Left Lower Extremity.  See table(s) above for measurements and observations. Electronically signed by Levora Dredge MD on 02/01/2020 at 11:46:17 AM.    Final        The results of significant diagnostics from this hospitalization (including imaging, microbiology, ancillary and laboratory) are listed below for reference.     Microbiology: Recent Results (from the past 240 hour(s))  SARS Coronavirus 2 by RT PCR (hospital order, performed in [facility] hospital lab) Nasopharyngeal Nasopharyngeal Swab     Status: None   Collection Time: 02/13/20  1:37 AM   Specimen: Nasopharyngeal Swab  Result Value Ref Range Status   SARS Coronavirus 2 NEGATIVE NEGATIVE Final    Comment: (NOTE) SARS-CoV-2 target nucleic acids are NOT DETECTED.  The SARS-CoV-2 RNA is generally detectable in upper and lower respiratory specimens during the acute phase of infection. The lowest concentration of SARS-CoV-2 viral copies this assay  can detect is 250 copies / mL. A negative result does not preclude SARS-CoV-2 infection and should not be used  as the sole basis for treatment or other patient management decisions.  A negative result may occur with improper specimen collection / handling, submission of specimen other than nasopharyngeal swab, presence of viral mutation(s) within the areas targeted by this assay, and inadequate number of viral copies (<250 copies / mL). A negative result must be combined with clinical observations, patient history, and epidemiological information.  Fact Sheet for Patients:   BoilerBrush.com.cy  Fact Sheet for Healthcare Providers: https://pope.com/  This test is not yet approved or  cleared by the [place] FDA and has been authorized for detection and/or diagnosis of SARS-CoV-2 by FDA under an Emergency Use Authorization (EUA).  This EUA will remain in effect (meaning this test can be used) for the duration of the COVID-19 declaration under Section 564(b)(1) of the Act, 21 U.S.C. section 360bbb-3(b)(1), unless the authorization is terminated or revoked sooner.  Performed at [facility], [address] [place], [place] [address]   MRSA PCR Screening     Status: None   Collection Time: 02/16/20  4:53 PM   Specimen: Nasopharyngeal  Result Value Ref Range Status   MRSA by PCR NEGATIVE NEGATIVE Final    Comment:        The GeneXpert MRSA Assay (FDA approved for NASAL specimens only), is one component of a comprehensive MRSA colonization surveillance program. It is not intended to diagnose MRSA infection nor to guide or monitor treatment for MRSA infections. Performed at [facility], [address] [place], [place] [address]   Urine Culture     Status: Abnormal   Collection Time: 02/18/20  5:48 PM   Specimen: Urine, Random  Result Value Ref Range Status   Specimen Description   Final    URINE, RANDOM Performed at [facility], [address] [place], [place] [address]    Special Requests   Final      NONE Performed at [facility], [address] [place], [place] [address]    Culture MULTIPLE SPECIES PRESENT, SUGGEST RECOLLECTION (A)  Final   Report Status 02/19/2020 FINAL  Final     Labs: BNP (last 3 results) Recent Labs    02/13/20 0137 02/13/20 0656 02/20/20 1308  BNP 1,085.6* 1,492.8* 1,613.4*   Basic Metabolic Panel: Recent Labs  Lab 02/16/20 0517 02/16/20 0517 02/17/20 0705 02/17/20 0705 02/18/20 0612 02/19/20 0744 02/20/20 0411 02/21/20 0511 02/22/20 0536  NA 126*   < > 130*   < > 131* 131* 135 135 137  K 4.0   < > 3.4*   < > 4.4 4.6 4.7 4.3 3.7  CL 87*   < > 89*   < > 92* 92* 94* 92* 93*  CO2 28   < > 28   < > 26 26 28 29 31   GLUCOSE 160*   < > 139*   < > 125* 202* 145* 105* 52*  BUN 47*   < > 43*   < > 47* 60* 63* 71* 71*  CREATININE 1.30*   < > 1.29*   < > 1.46* 1.60* 1.57* 1.64* 1.62*  CALCIUM 8.9   < > 9.1   < > 9.3 9.4 9.4 9.7 9.7  MG 1.9  --  2.2  --   --   --  2.0 2.1 2.2  PHOS  --   --   --   --   --   --   --   --  4.2   < > = values in this interval not displayed.   Liver Function Tests: Recent Labs  Lab 02/22/20 0536  ALBUMIN 3.5   No results for input(s): LIPASE, AMYLASE in the last 168 hours. No results for input(s): AMMONIA in the last 168 hours. CBC: Recent Labs  Lab 02/16/20 0517 02/17/20 0705 02/18/20 0612 02/19/20 0858 02/20/20 0411 02/21/20 0511 02/22/20 0536  WBC 16.2*   < > 20.3* 19.2* 18.6* 17.4* 17.3*  NEUTROABS 11.1*  --   --   --   --   --   --   HGB 8.5*   < > 8.7* 8.9* 8.7* 9.1* 9.5*  HCT 26.2*   < > 26.8* 28.6* 27.4* 28.7* 31.4*  MCV 76.8*   < > 77.7* 81.0 79.9* 80.6 84.4  PLT 393   < > 411* 381 399 416* 408*   < > = values in this interval not displayed.   Cardiac Enzymes: No results for input(s): CKTOTAL, CKMB, CKMBINDEX, TROPONINI in the last 168 hours. BNP: Invalid input(s): POCBNP CBG: Recent Labs  Lab 02/21/20 0724 02/21/20 1623 02/21/20 2139 02/22/20 0755 02/22/20 0831  GLUCAP  96 159* 157* 60* 77   D-Dimer No results for input(s): DDIMER in the last 72 hours. Hgb A1c No results for input(s): HGBA1C in the last 72 hours. Lipid Profile No results for input(s): CHOL, HDL, LDLCALC, TRIG, CHOLHDL, LDLDIRECT in the last 72 hours. Thyroid function studies No results for input(s): TSH, T4TOTAL, T3FREE, THYROIDAB in the last 72 hours.  Invalid input(s): FREET3 Anemia work up No results for input(s): VITAMINB12, FOLATE, FERRITIN, TIBC, IRON, RETICCTPCT in the last 72 hours. Urinalysis    Component Value Date/Time   COLORURINE YELLOW (A) 02/17/2020 1749   APPEARANCEUR CLOUDY (A) 02/17/2020 1749   APPEARANCEUR Cloudy 07/25/2013 1305   LABSPEC 1.011 02/17/2020 1749   LABSPEC 1.014 07/25/2013 1305   PHURINE 6.0 02/17/2020 1749   GLUCOSEU 150 (A) 02/17/2020 1749   GLUCOSEU Negative 07/25/2013 1305   HGBUR LARGE (A) 02/17/2020 1749   BILIRUBINUR NEGATIVE 02/17/2020 1749   BILIRUBINUR Negative 07/25/2013 1305   KETONESUR NEGATIVE 02/17/2020 1749   PROTEINUR NEGATIVE 02/17/2020 1749   NITRITE NEGATIVE 02/17/2020 1749   LEUKOCYTESUR LARGE (A) 02/17/2020 1749   LEUKOCYTESUR 3+ 07/25/2013 1305   Sepsis Labs Invalid input(s): PROCALCITONIN,  WBC,  LACTICIDVEN   Time coordinating discharge: 45 minutes  SIGNED:  Almon Hercules, MD  [facility] 02/22/2020, 10:45 AM  If 7PM-7AM, please contact night-coverage [email or web address]

## 2020-02-23 ENCOUNTER — Encounter: Payer: Self-pay | Admitting: Internal Medicine

## 2020-02-28 DEATH — deceased

## 2021-05-26 IMAGING — DX DG CHEST 1V PORT
1 series · 1 of 1 positions shown · non-contrast
Comparison: 04/28/2013

CLINICAL DATA: Dyspnea

EXAM:
PORTABLE CHEST 1 VIEW

[chest ap]
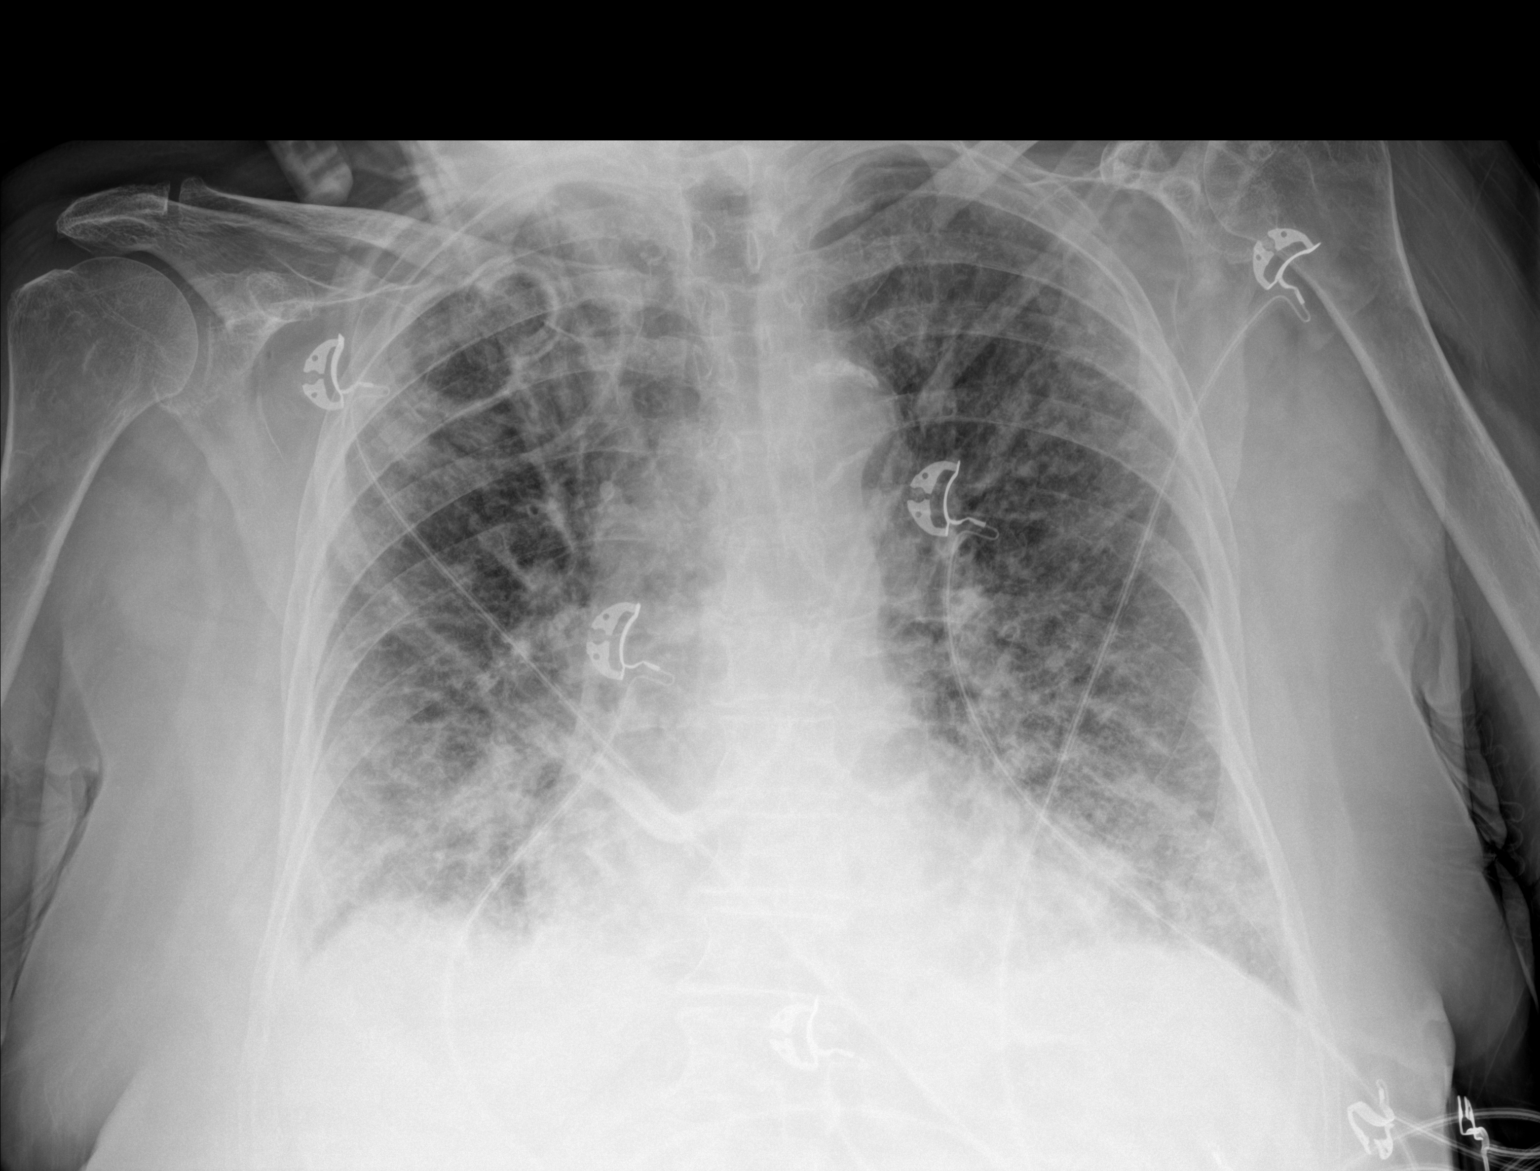

[1 of 1 positions shown; findings below may reference images not displayed]

FINDINGS: Diffuse interstitial opacity, worst in the lower lobes. No
pneumothorax or sizable pleural effusion.
IMPRESSION: Diffuse interstitial opacity, worst in the lower lobes, likely
indicating pulmonary edema.

## 2021-05-30 IMAGING — DX DG CHEST 1V PORT
1 series · 1 of 1 positions shown · non-contrast
Comparison: Chest radiograph from one day prior.

CLINICAL DATA: Hypoxia, current smoker

EXAM:
PORTABLE CHEST 1 VIEW

[chest ap]
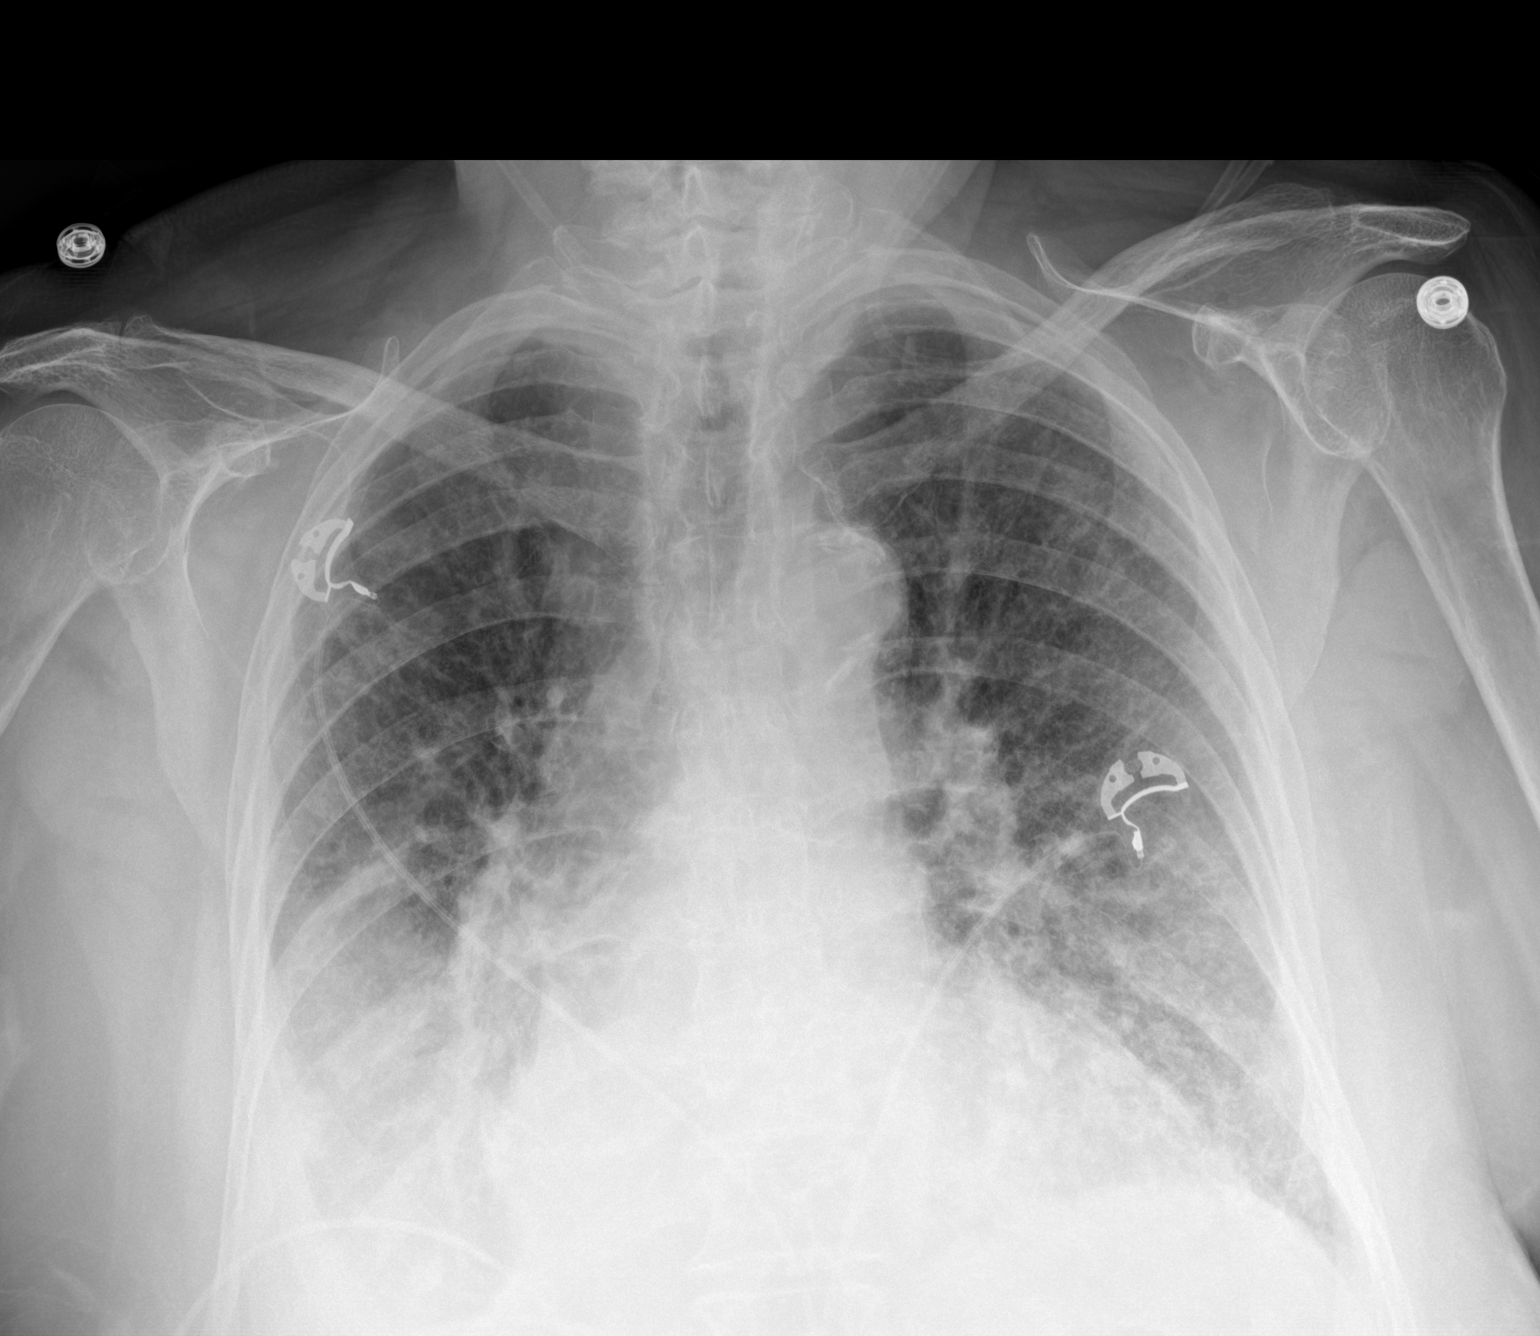

[1 of 1 positions shown; findings below may reference images not displayed]

FINDINGS: Stable cardiomediastinal silhouette with mild cardiomegaly. No
pneumothorax. Small stable right pleural effusion. No left pleural
effusion. Diffuse prominence of the parahilar interstitial markings,
stable. Patchy opacities at both lung bases, stable.
IMPRESSION: 1. Stable mild cardiomegaly with diffuse prominence of the parahilar
interstitial markings, suggesting pulmonary edema.
2. Stable small right pleural effusion.
3. Stable patchy bibasilar lung opacities, favor atelectasis, cannot
exclude a component of aspiration or pneumonia.

## 2021-06-03 IMAGING — DX DG CHEST 1V PORT
1 series · 1 of 1 positions shown · non-contrast
Comparison: February 17, 2020.

CLINICAL DATA: Acute hypoxemic respiratory failure.

EXAM:
PORTABLE CHEST 1 VIEW

[chest ap]
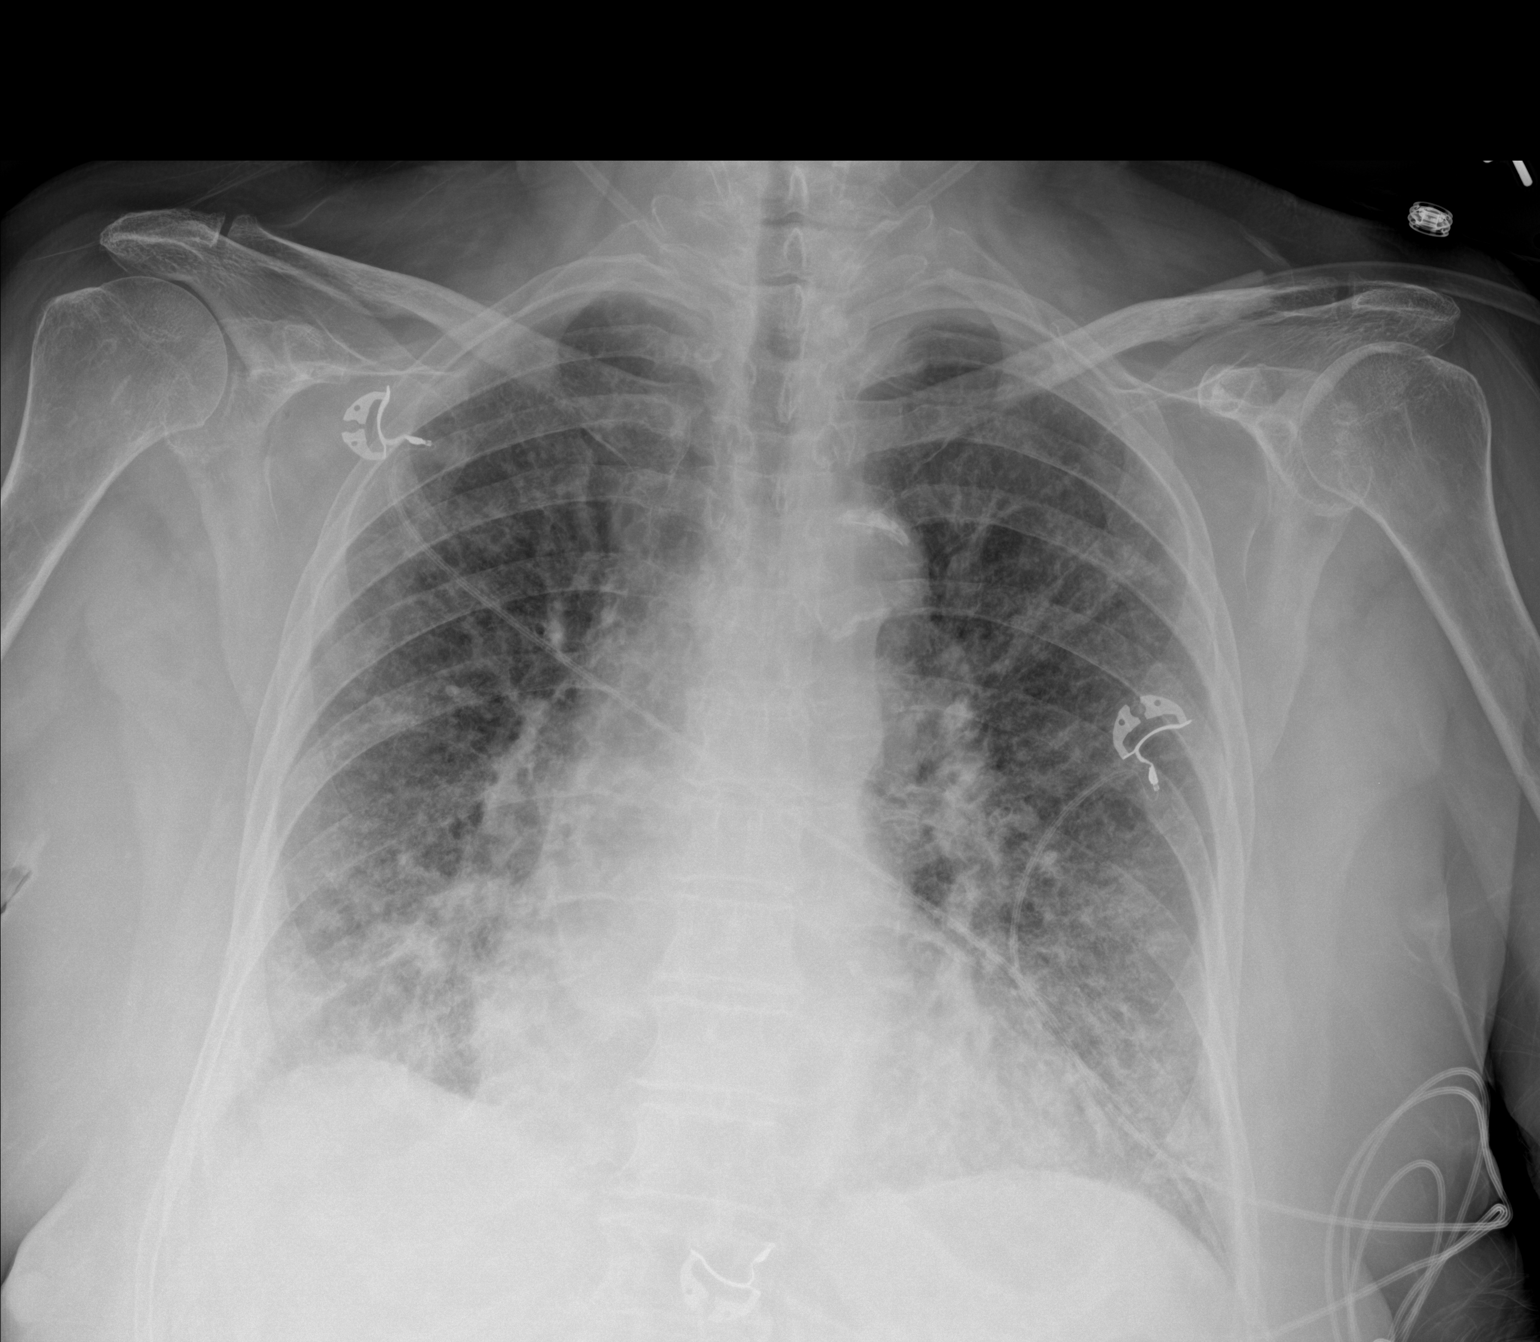

[1 of 1 positions shown; findings below may reference images not displayed]

FINDINGS: Stable cardiomediastinal silhouette. No pneumothorax or pleural
effusion is noted. Diffuse interstitial densities are noted
concerning for edema or atypical inflammation. Bony thorax is
unremarkable.
IMPRESSION: Diffuse interstitial densities are noted concerning for edema or
atypical inflammation.

Aortic Atherosclerosis ([id number]).
